# Patient Record
Sex: Male | Born: 1970 | Race: Black or African American | Hispanic: No | Marital: Single | Smoking: Current some day smoker
Health system: Southern US, Community
[De-identification: ages and names within clinical notes are randomized; demographics above are authoritative.]

## PROBLEM LIST (undated history)

## (undated) DIAGNOSIS — I2510 Atherosclerotic heart disease of native coronary artery without angina pectoris: Secondary | ICD-10-CM

## (undated) DIAGNOSIS — I509 Heart failure, unspecified: Secondary | ICD-10-CM

## (undated) HISTORY — PX: HERNIA REPAIR: SHX51

## (undated) HISTORY — DX: Heart failure, unspecified: I50.9

## (undated) HISTORY — DX: Atherosclerotic heart disease of native coronary artery without angina pectoris: I25.10

---

## 1998-07-24 ENCOUNTER — Emergency Department (HOSPITAL_COMMUNITY): Admission: EM | Admit: 1998-07-24 | Discharge: 1998-07-24 | Payer: Self-pay

## 1998-10-15 ENCOUNTER — Encounter: Payer: Self-pay | Admitting: Emergency Medicine

## 1998-10-15 ENCOUNTER — Emergency Department (HOSPITAL_COMMUNITY): Admission: EM | Admit: 1998-10-15 | Discharge: 1998-10-15 | Payer: Self-pay | Admitting: Emergency Medicine

## 2001-02-22 ENCOUNTER — Emergency Department (HOSPITAL_COMMUNITY): Admission: EM | Admit: 2001-02-22 | Discharge: 2001-02-22 | Payer: Self-pay | Admitting: Emergency Medicine

## 2003-06-12 ENCOUNTER — Emergency Department (HOSPITAL_COMMUNITY): Admission: EM | Admit: 2003-06-12 | Discharge: 2003-06-12 | Payer: Self-pay | Admitting: Emergency Medicine

## 2003-06-15 ENCOUNTER — Emergency Department (HOSPITAL_COMMUNITY): Admission: EM | Admit: 2003-06-15 | Discharge: 2003-06-15 | Payer: Self-pay | Admitting: Emergency Medicine

## 2016-07-27 ENCOUNTER — Encounter (HOSPITAL_COMMUNITY): Payer: Self-pay | Admitting: Emergency Medicine

## 2016-07-27 ENCOUNTER — Emergency Department (HOSPITAL_COMMUNITY)
Admission: EM | Admit: 2016-07-27 | Discharge: 2016-07-27 | Disposition: A | Discharge status: Home / Self Care | Payer: No Typology Code available for payment source | Attending: Emergency Medicine | Admitting: Emergency Medicine

## 2016-07-27 ENCOUNTER — Emergency Department (HOSPITAL_COMMUNITY): Payer: No Typology Code available for payment source

## 2016-07-27 DIAGNOSIS — M542 Cervicalgia: Secondary | ICD-10-CM

## 2016-07-27 DIAGNOSIS — S0990XA Unspecified injury of head, initial encounter: Secondary | ICD-10-CM | POA: Diagnosis present

## 2016-07-27 DIAGNOSIS — M5090 Cervical disc disorder, unspecified, unspecified cervical region: Secondary | ICD-10-CM | POA: Diagnosis not present

## 2016-07-27 DIAGNOSIS — S060X0A Concussion without loss of consciousness, initial encounter: Secondary | ICD-10-CM | POA: Insufficient documentation

## 2016-07-27 DIAGNOSIS — Y999 Unspecified external cause status: Secondary | ICD-10-CM | POA: Insufficient documentation

## 2016-07-27 DIAGNOSIS — M5030 Other cervical disc degeneration, unspecified cervical region: Secondary | ICD-10-CM

## 2016-07-27 DIAGNOSIS — Y92410 Unspecified street and highway as the place of occurrence of the external cause: Secondary | ICD-10-CM | POA: Insufficient documentation

## 2016-07-27 DIAGNOSIS — Y939 Activity, unspecified: Secondary | ICD-10-CM | POA: Insufficient documentation

## 2016-07-27 DIAGNOSIS — F17200 Nicotine dependence, unspecified, uncomplicated: Secondary | ICD-10-CM | POA: Insufficient documentation

## 2016-07-27 DIAGNOSIS — M62838 Other muscle spasm: Secondary | ICD-10-CM | POA: Diagnosis not present

## 2016-07-27 DIAGNOSIS — G44319 Acute post-traumatic headache, not intractable: Secondary | ICD-10-CM

## 2016-07-27 MED ORDER — IBUPROFEN 400 MG PO TABS
800.0000 mg | ORAL_TABLET | Freq: Once | ORAL | Status: AC
  Administered 2016-07-27: 800 mg via ORAL
  Filled 2016-07-27: qty 2

## 2016-07-27 MED ORDER — CYCLOBENZAPRINE HCL 10 MG PO TABS: 10.0000 mg | ORAL_TABLET | Freq: Three times a day (TID) | ORAL | 0 refills | Status: DC | PRN

## 2016-07-27 MED ORDER — NAPROXEN 500 MG PO TABS: 500.0000 mg | ORAL_TABLET | Freq: Two times a day (BID) | ORAL | 0 refills | Status: DC | PRN

## 2016-07-27 NOTE — Discharge Instructions (Signed)
Take naprosyn as directed for inflammation and pain with tylenol for breakthrough pain and flexeril for muscle relaxation. Do not drive or operate machinery with muscle relaxant use. Get plenty of rest, use ice on your head.  Stay in a quiet, not simulating, dark environment. No TV, computer use, video games, or cell phone use until headache is resolved completely. Ice to areas of soreness for the next 24 hours and then may move to heat, no more than 20 minutes at a time every hour for each. Expect to be sore for the next few days and follow up with [facility] for recheck of ongoing symptoms and establish medical care in the next 1 week. Return to ER for emergent changing or worsening of symptoms.
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
## 2016-09-03 ENCOUNTER — Emergency Department (HOSPITAL_COMMUNITY)
Admission: EM | Admit: 2016-09-03 | Discharge: 2016-09-03 | Disposition: A | Discharge status: Home / Self Care | Payer: No Typology Code available for payment source | Attending: Emergency Medicine | Admitting: Emergency Medicine

## 2016-09-03 ENCOUNTER — Encounter (HOSPITAL_COMMUNITY): Payer: Self-pay | Admitting: *Deleted

## 2016-09-03 DIAGNOSIS — F17200 Nicotine dependence, unspecified, uncomplicated: Secondary | ICD-10-CM | POA: Diagnosis not present

## 2016-09-03 DIAGNOSIS — Z041 Encounter for examination and observation following transport accident: Secondary | ICD-10-CM | POA: Diagnosis not present

## 2016-09-03 DIAGNOSIS — Z09 Encounter for follow-up examination after completed treatment for conditions other than malignant neoplasm: Secondary | ICD-10-CM
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
## 2016-09-03 NOTE — Discharge Instructions (Signed)
Return here as needed.  Follow-up with your primary care doctor °
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
## 2019-09-18 ENCOUNTER — Other Ambulatory Visit: Payer: Self-pay

## 2019-09-18 ENCOUNTER — Encounter: Payer: Self-pay | Admitting: *Deleted

## 2019-09-18 ENCOUNTER — Emergency Department
Admission: EM | Admit: 2019-09-18 | Discharge: 2019-09-18 | Disposition: A | Discharge status: Home / Self Care | Payer: Self-pay | Attending: Emergency Medicine | Admitting: Emergency Medicine

## 2019-09-18 ENCOUNTER — Emergency Department: Payer: Self-pay

## 2019-09-18 DIAGNOSIS — S9001XA Contusion of right ankle, initial encounter: Secondary | ICD-10-CM

## 2019-09-18 DIAGNOSIS — F17200 Nicotine dependence, unspecified, uncomplicated: Secondary | ICD-10-CM | POA: Insufficient documentation

## 2019-09-18 DIAGNOSIS — W228XXD Striking against or struck by other objects, subsequent encounter: Secondary | ICD-10-CM | POA: Insufficient documentation

## 2019-09-18 DIAGNOSIS — Y990 Civilian activity done for income or pay: Secondary | ICD-10-CM | POA: Insufficient documentation

## 2019-09-18 DIAGNOSIS — S9001XD Contusion of right ankle, subsequent encounter: Secondary | ICD-10-CM | POA: Insufficient documentation

## 2019-09-18 DIAGNOSIS — Y9289 Other specified places as the place of occurrence of the external cause: Secondary | ICD-10-CM | POA: Insufficient documentation
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
## 2019-09-18 NOTE — Discharge Instructions (Addendum)
Follow-up with orthopedics.  Please call for an appointment.  Your Worker's [employer] insurance carrier prior to making the appointment so that you did not end up paying for the visit.  Take over-the-counter Aleve.  Elevate and ice.  Use crutches as needed.

## 2019-12-21 ENCOUNTER — Emergency Department: Payer: Self-pay

## 2019-12-21 ENCOUNTER — Emergency Department
Admission: EM | Admit: 2019-12-21 | Discharge: 2019-12-21 | Disposition: A | Discharge status: Home / Self Care | Payer: Self-pay | Attending: Emergency Medicine | Admitting: Emergency Medicine

## 2019-12-21 ENCOUNTER — Other Ambulatory Visit: Payer: Self-pay

## 2019-12-21 DIAGNOSIS — F17210 Nicotine dependence, cigarettes, uncomplicated: Secondary | ICD-10-CM | POA: Insufficient documentation

## 2019-12-21 DIAGNOSIS — N200 Calculus of kidney: Secondary | ICD-10-CM | POA: Insufficient documentation

## 2019-12-21 LAB — COMPREHENSIVE METABOLIC PANEL
ALT: 27 U/L (ref 0–44)
AST: 27 U/L (ref 15–41)
Albumin: 4.7 g/dL (ref 3.5–5.0)
Alkaline Phosphatase: 97 U/L (ref 38–126)
Anion gap: 10 (ref 5–15)
BUN: 11 mg/dL (ref 6–20)
CO2: 28 mmol/L (ref 22–32)
Calcium: 9.6 mg/dL (ref 8.9–10.3)
Chloride: 102 mmol/L (ref 98–111)
Creatinine, Ser: 1.32 mg/dL — ABNORMAL HIGH (ref 0.61–1.24)
GFR calc Af Amer: >60 mL/min (ref >60)
GFR calc non Af Amer: >60 mL/min (ref >60)
Glucose, Bld: 147 mg/dL — ABNORMAL HIGH (ref 70–99)
Potassium: 3.2 mmol/L — ABNORMAL LOW (ref 3.5–5.1)
Sodium: 140 mmol/L (ref 135–145)
Total Bilirubin: 1.3 mg/dL — ABNORMAL HIGH (ref 0.3–1.2)
Total Protein: 7.7 g/dL (ref 6.5–8.1)

## 2019-12-21 LAB — URINALYSIS, COMPLETE (UACMP) WITH MICROSCOPIC
Bilirubin Urine: NEGATIVE
Glucose, UA: 50 mg/dL — AB
Ketones, ur: NEGATIVE mg/dL
Leukocytes,Ua: NEGATIVE
Nitrite: NEGATIVE
Protein, ur: 30 mg/dL — AB
RBC / HPF: >50 RBC/hpf — ABNORMAL HIGH (ref 0–5)
Specific Gravity, Urine: 1.01 (ref 1.005–1.030)
pH: 6 (ref 5.0–8.0)

## 2019-12-21 LAB — CBC
HCT: 47.6 % (ref 39.0–52.0)
Hemoglobin: 16.9 g/dL (ref 13.0–17.0)
MCH: 33.3 pg (ref 26.0–34.0)
MCHC: 35.5 g/dL (ref 30.0–36.0)
MCV: 93.9 fL (ref 80.0–100.0)
Platelets: 252 10*3/uL (ref 150–400)
RBC: 5.07 MIL/uL (ref 4.22–5.81)
RDW: 11.3 % — ABNORMAL LOW (ref 11.5–15.5)
WBC: 18.6 10*3/uL — ABNORMAL HIGH (ref 4.0–10.5)
nRBC: 0 % (ref 0.0–0.2)

## 2019-12-21 LAB — LIPASE, BLOOD: Lipase: 105 U/L — ABNORMAL HIGH (ref 11–51)

## 2019-12-21 MED ORDER — IOHEXOL 300 MG/ML  SOLN
100.0000 mL | Freq: Once | INTRAMUSCULAR | Status: AC | PRN
  Administered 2019-12-21: 100 mL via INTRAVENOUS

## 2019-12-21 MED ORDER — OXYCODONE-ACETAMINOPHEN 5-325 MG PO TABS: 1.0000 | ORAL_TABLET | ORAL | 0 refills | Status: AC | PRN

## 2019-12-21 NOTE — ED Notes (Signed)
MD at bedside. 

## 2019-12-21 NOTE — ED Provider Notes (Signed)
[facility] Emergency Department Provider Note  ____________________________________________   First MD Initiated Contact with Patient 12/21/19 0309     (approximate)  I have reviewed the triage vital signs and the nursing notes.   HISTORY  Chief Complaint Abdominal Pain and Emesis    HPI Willie Campbell is a 49 y.o. male with no chronic medical conditions presents to the emergency department secondary to acute onset of right lower quadrant abdominal pain with associated vomiting that began tonight after eating hot chips".  Patient denies any diarrhea no fever.  Patient states that nausea vomiting has resolved and pain is improved since onset.  Patient states that the pain radiated into his right testicle.  Patient denies any urinary symptoms        History reviewed. No pertinent past medical history.  There are no problems to display for this patient.   Past Surgical History:  Procedure Laterality Date  . HERNIA REPAIR      Prior to Admission medications   Medication Sig Start Date End Date Taking? Authorizing Provider  oxyCODONE-acetaminophen (PERCOCET) 5-325 MG tablet Take 1 tablet by mouth every 4 (four) hours as needed. 12/21/19 12/20/20  Darci Current, MD    Allergies Patient has no known allergies.  No family history on file.  Social History Social History   Tobacco Use  . Smoking status: Current Some Day Smoker  . Smokeless tobacco: Never Used  Substance Use Topics  . Alcohol use: Not Currently    Comment: sometimes  . Drug use: No    Review of Systems Constitutional: No fever/chills Eyes: No visual changes. ENT: No sore throat. Cardiovascular: Denies chest pain. Respiratory: Denies shortness of breath. Gastrointestinal: Positive for abdominal pain nausea and vomiting no diarrhea.  No constipation. Genitourinary: Negative for dysuria. Musculoskeletal: Negative for neck pain.  Negative for back pain. Integumentary: Negative  for rash. Neurological: Negative for headaches, focal weakness or numbness.   ____________________________________________   PHYSICAL EXAM:  VITAL SIGNS: ED Triage Vitals  Enc Vitals Group     BP 12/21/19 0110 (!) 148/92     Pulse Rate 12/21/19 0110 (!) 59     Resp 12/21/19 0110 18     Temp 12/21/19 0110 97.9 F (36.6 C)     Temp Source 12/21/19 0110 Oral     SpO2 12/21/19 0110 92 %     Weight 12/21/19 0111 107.5 kg (237 lb)     Height 12/21/19 0111 1.803 m (5\' 11" )     Head Circumference --      Peak Flow --      Pain Score 12/21/19 0111 7     Pain Loc --      Pain Edu? --      Excl. in GC? --     Constitutional: Alert and oriented.  Eyes: Conjunctivae are normal.  Mouth/Throat: Patient is wearing a mask. Neck: No stridor.  No meningeal signs.   Cardiovascular: Normal rate, regular rhythm. Good peripheral circulation. Grossly normal heart sounds. Respiratory: Normal respiratory effort.  No retractions. Gastrointestinal: Right lower quadrant tenderness. No distention.  Musculoskeletal: No lower extremity tenderness nor edema. No gross deformities of extremities. Neurologic:  Normal speech and language. No gross focal neurologic deficits are appreciated.  Skin:  Skin is warm, dry and intact. Psychiatric: Mood and affect are normal. Speech and behavior are normal.  ____________________________________________   LABS (all labs ordered are listed, but only abnormal results are displayed)  Labs Reviewed  LIPASE, BLOOD -  Abnormal; Notable for the following components:      Result Value   Lipase 105 (*)    All other components within normal limits  COMPREHENSIVE METABOLIC PANEL - Abnormal; Notable for the following components:   Potassium 3.2 (*)    Glucose, Bld 147 (*)    Creatinine, Ser 1.32 (*)    Total Bilirubin 1.3 (*)    All other components within normal limits  CBC - Abnormal; Notable for the following components:   WBC 18.6 (*)    RDW 11.3 (*)    All  other components within normal limits  URINALYSIS, COMPLETE (UACMP) WITH MICROSCOPIC - Abnormal; Notable for the following components:   Color, Urine YELLOW (*)    APPearance CLEAR (*)    Glucose, UA 50 (*)    Hgb urine dipstick LARGE (*)    Protein, ur 30 (*)    RBC / HPF >50 (*)    Bacteria, UA RARE (*)    All other components within normal limits      RADIOLOGY I, [place] Gaylene Moylan, personally viewed and evaluated these images (plain radiographs) as part of my medical decision making, as well as reviewing the written report by the radiologist.  ED MD interpretation: 5 x 3.5 distal right urethral calculus with mild right-sided hydroureteronephrosis noted on CT abdomen pelvis per radiologist.  Official radiology report(s): CT ABDOMEN PELVIS W CONTRAST  Result Date: 12/21/2019 CLINICAL DATA:  Right lower quadrant abdominal pain with nausea and vomiting. EXAM: CT ABDOMEN AND PELVIS WITH CONTRAST TECHNIQUE: Multidetector CT imaging of the abdomen and pelvis was performed using the standard protocol following bolus administration of intravenous contrast. CONTRAST:  OMNIPAQUE IOHEXOL 300 MG/ML  SOLN COMPARISON:  None. FINDINGS: Lower chest: Insert lung bases Hepatobiliary: No focal hepatic lesions or intrahepatic biliary dilatation. The gallbladder is normal. No common bile duct dilatation. Pancreas: No mass, inflammation or ductal dilatation. Spleen: Normal size. No focal lesions. Adrenals/Urinary Tract: The adrenal glands are unremarkable. There is mild right-sided hydroureteronephrosis down to an obstructing 5.0 x 3.5 mm calculus in the distal right ureter just proximal to the UVJ. No renal calculi or left ureteral calculi. No worrisome renal lesions and no bladder lesions. Stomach/Bowel: The stomach, duodenum, small bowel and colon are grossly normal without oral contrast. No inflammatory changes, mass lesions or obstructive findings. The appendix is normal. Vascular/Lymphatic: The aorta is  normal in caliber. No dissection. Age advanced atherosclerotic calcifications involving the aorta and iliac arteries. The branch vessels are patent. The major venous structures are patent. No mesenteric or retroperitoneal mass or adenopathy. Small scattered lymph nodes are noted. Reproductive: The prostate gland and seminal vesicles are unremarkable. Other: No pelvic mass or adenopathy. No free pelvic fluid collections. No inguinal mass or adenopathy. No abdominal wall hernia or subcutaneous lesions. Musculoskeletal: No significant bony findings. Advanced degenerative disc disease noted at L5-S1. IMPRESSION: 1. 5.0 x 3.5 mm distal right ureteral calculus causing mild right-sided hydroureteronephrosis. 2. No renal calculi or worrisome renal lesions. 3. No other acute abdominal/pelvic findings, mass lesions or adenopathy. 4. Age advanced atherosclerotic calcifications involving the aorta and iliac arteries. Electronically Signed   By: Rudie Meyer M.D.   On: 12/21/2019 05:04     Procedures   ____________________________________________   INITIAL IMPRESSION / MDM / ASSESSMENT AND PLAN / ED COURSE  As part of my medical decision making, I reviewed the following data within the electronic MEDICAL RECORD NUMBER   49 year old male presented with above-stated history and physical  exam with differential diagnosis including but not limited to gastroenteritis appendicitis ureterolithiasis.  As such CT scan revealed right-sided ureterolithiasis as stated above.  Patient advised of all clinical findings and necessity of following up with urology.  Patient has no pain at present.  Patient will be prescribed Percocet as needed for home.  Patient advised not to drive while taking Percocet ____________________________________________  FINAL CLINICAL IMPRESSION(S) / ED DIAGNOSES  Final diagnoses:  Right kidney stone     MEDICATIONS GIVEN DURING THIS VISIT:  Medications  iohexol (OMNIPAQUE) 300 MG/ML solution  100 mL (100 mLs Intravenous Contrast Given 12/21/19 0357)     ED Discharge Orders         Ordered    oxyCODONE-acetaminophen (PERCOCET) 5-325 MG tablet  Every 4 hours PRN     12/21/19 0537          *Please note:  UZIEL COVAULT was evaluated in Emergency Department on 12/21/2019 for the symptoms described in the history of present illness. He was evaluated in the context of the global COVID-19 pandemic, which necessitated consideration that the patient might be at risk for infection with the SARS-CoV-2 virus that causes COVID-19. Institutional protocols and algorithms that pertain to the evaluation of patients at risk for COVID-19 are in a state of rapid change based on information released by regulatory bodies including the CDC and federal and state organizations. These policies and algorithms were followed during the patient's care in the ED.  Some ED evaluations and interventions may be delayed as a result of limited staffing during the pandemic.*  Note:  This document was prepared using Dragon voice recognition software and may include unintentional dictation errors.   Gregor Hams, MD 12/21/19 [phone]

## 2019-12-21 NOTE — ED Triage Notes (Signed)
Patient c/o LRQ abdominal pain radiating to testicle and N/V. Patient denies urinary changes, testicular swelling/redness.

## 2020-01-02 ENCOUNTER — Emergency Department (HOSPITAL_COMMUNITY)
Admission: EM | Admit: 2020-01-02 | Discharge: 2020-01-02 | Disposition: A | Discharge status: Home / Self Care | Payer: Self-pay | Attending: Emergency Medicine | Admitting: Emergency Medicine

## 2020-01-02 ENCOUNTER — Other Ambulatory Visit: Payer: Self-pay

## 2020-01-02 DIAGNOSIS — Z720 Tobacco use: Secondary | ICD-10-CM | POA: Insufficient documentation

## 2020-01-02 DIAGNOSIS — M5440 Lumbago with sciatica, unspecified side: Secondary | ICD-10-CM

## 2020-01-02 DIAGNOSIS — M5442 Lumbago with sciatica, left side: Secondary | ICD-10-CM | POA: Insufficient documentation

## 2020-01-02 LAB — CBC WITH DIFFERENTIAL/PLATELET
Abs Immature Granulocytes: 0.03 10*3/uL (ref 0.00–0.07)
Basophils Absolute: 0.1 10*3/uL (ref 0.0–0.1)
Basophils Relative: 0 %
Eosinophils Absolute: 0 10*3/uL (ref 0.0–0.5)
Eosinophils Relative: 0 %
HCT: 48.2 % (ref 39.0–52.0)
Hemoglobin: 16.5 g/dL (ref 13.0–17.0)
Immature Granulocytes: 0 %
Lymphocytes Relative: 12 %
Lymphs Abs: 1.3 10*3/uL (ref 0.7–4.0)
MCH: 33.1 pg (ref 26.0–34.0)
MCHC: 34.2 g/dL (ref 30.0–36.0)
MCV: 96.8 fL (ref 80.0–100.0)
Monocytes Absolute: 0.5 10*3/uL (ref 0.1–1.0)
Monocytes Relative: 4 %
Neutro Abs: 9.7 10*3/uL — ABNORMAL HIGH (ref 1.7–7.7)
Neutrophils Relative %: 84 %
Platelets: 253 10*3/uL (ref 150–400)
RBC: 4.98 MIL/uL (ref 4.22–5.81)
RDW: 11 % — ABNORMAL LOW (ref 11.5–15.5)
WBC: 11.6 10*3/uL — ABNORMAL HIGH (ref 4.0–10.5)
nRBC: 0 % (ref 0.0–0.2)

## 2020-01-02 LAB — URINALYSIS, ROUTINE W REFLEX MICROSCOPIC
Bilirubin Urine: NEGATIVE
Glucose, UA: NEGATIVE mg/dL
Hgb urine dipstick: NEGATIVE
Ketones, ur: 20 mg/dL — AB
Leukocytes,Ua: NEGATIVE
Nitrite: NEGATIVE
Protein, ur: NEGATIVE mg/dL
Specific Gravity, Urine: 1.016 (ref 1.005–1.030)
pH: 6 (ref 5.0–8.0)

## 2020-01-02 LAB — BASIC METABOLIC PANEL
Anion gap: 8 (ref 5–15)
BUN: 14 mg/dL (ref 6–20)
CO2: 26 mmol/L (ref 22–32)
Calcium: 9.4 mg/dL (ref 8.9–10.3)
Chloride: 102 mmol/L (ref 98–111)
Creatinine, Ser: 1.05 mg/dL (ref 0.61–1.24)
GFR calc Af Amer: >60 mL/min (ref >60)
GFR calc non Af Amer: >60 mL/min (ref >60)
Glucose, Bld: 85 mg/dL (ref 70–99)
Potassium: 4 mmol/L (ref 3.5–5.1)
Sodium: 136 mmol/L (ref 135–145)

## 2020-01-02 MED ORDER — PREDNISONE 20 MG PO TABS: 40.0000 mg | ORAL_TABLET | Freq: Every day | ORAL | 0 refills | Status: AC

## 2020-01-02 MED ORDER — METHOCARBAMOL 500 MG PO TABS: 500.0000 mg | ORAL_TABLET | Freq: Two times a day (BID) | ORAL | 0 refills | Status: DC

## 2020-01-02 MED ORDER — KETOROLAC TROMETHAMINE 15 MG/ML IJ SOLN
15.0000 mg | Freq: Once | INTRAMUSCULAR | Status: AC
  Administered 2020-01-02: 15 mg via INTRAVENOUS
  Filled 2020-01-02: qty 1

## 2020-01-02 MED ORDER — PREDNISONE 20 MG PO TABS: 40.0000 mg | ORAL_TABLET | Freq: Every day | ORAL | 0 refills | Status: DC

## 2020-01-02 NOTE — ED Provider Notes (Signed)
[facility] EMERGENCY DEPARTMENT Provider Note   CSN: [id number] Arrival date & time: 01/02/20  1043     History Chief Complaint  Patient presents with  . Nephrolithiasis    Willie Campbell is a 49 y.o. male who presents for evaluation of left-sided back pain that began acutely this morning while driving.  He states that while driving, he started having pain in his left lower back.  He states that the pain radiated into his left lower thigh.  He states that the pain was worse with movement and he did have some associated diaphoresis.  No nausea/vomiting.  He also feels like the pain radiates into his groin.  He states he has had similar pain for about a week and a half now.  On 12/21/2019, he had worsening pain and nausea/vomiting.  He was seen at [facility] and found to have a right-sided kidney stone.  He was discharged home with Percocet.  He states he is continued to have some left-sided back pain.  On ED arrival, he still states pain is in the left lower back.  He states it hurts more when he bends or tries to move.  He notes especially when he changes positions from laying down, he has worsening pain.  He states that sometimes it will radiate down his thigh as a sharp pain.  He does not know of any preceding trauma, injury.  He does report a remote work injury several months ago.  No chest pain, difficulty breathing, dysuria, hematuria, testicular pain or swelling denies fevers, weight loss, numbness/weakness of upper and lower extremities, bowel/bladder incontinence, saddle anesthesia, history of back surgery, history of IVDA.   The history is provided by the patient.       No past medical history on file.  There are no problems to display for this patient.   Past Surgical History:  Procedure Laterality Date  . HERNIA REPAIR         No family history on file.  Social History   Tobacco Use  . Smoking status: Current Some Day Smoker  .  Smokeless tobacco: Never Used  Substance Use Topics  . Alcohol use: Not Currently    Comment: sometimes  . Drug use: No    Home Medications Prior to Admission medications   Medication Sig Start Date End Date Taking? Authorizing Provider  methocarbamol (ROBAXIN) 500 MG tablet Take 1 tablet (500 mg total) by mouth 2 (two) times daily. 01/02/20   Maxwell Caul, PA-C  oxyCODONE-acetaminophen (PERCOCET) 5-325 MG tablet Take 1 tablet by mouth every 4 (four) hours as needed. 12/21/19 12/20/20  Darci Current, MD  predniSONE (DELTASONE) 20 MG tablet Take 2 tablets (40 mg total) by mouth daily for 4 days. 01/02/20 01/06/20  Maxwell Caul, PA-C    Allergies    Patient has no known allergies.  Review of Systems   Review of Systems  Respiratory: Negative for cough and shortness of breath.   Cardiovascular: Negative for chest pain.  Gastrointestinal: Negative for abdominal pain, nausea and vomiting.  Genitourinary: Negative for dysuria and hematuria.  Musculoskeletal: Positive for back pain.  Neurological: Negative for weakness, numbness and headaches.  All other systems reviewed and are negative.   Physical Exam Updated Vital Signs BP (!) 145/92   Pulse 80   Temp 97.7 F (36.5 C) (Oral)   Resp 17   SpO2 99%   Physical Exam Vitals and nursing note reviewed. Exam conducted with a  chaperone present.  Constitutional:      Appearance: Normal appearance. He is well-developed.  HENT:     Head: Normocephalic and atraumatic.  Eyes:     General: Lids are normal.     Conjunctiva/sclera: Conjunctivae normal.     Pupils: Pupils are equal, round, and reactive to light.  Neck:     Comments: Full flexion/extension and lateral movement of neck fully intact. No bony midline tenderness. No deformities or crepitus.  Cardiovascular:     Rate and Rhythm: Normal rate and regular rhythm.     Pulses: Normal pulses.          Radial pulses are 2+ on the right side and 2+ on the left side.        Dorsalis pedis pulses are 2+ on the right side and 2+ on the left side.     Heart sounds: Normal heart sounds. No murmur. No friction rub. No gallop.   Pulmonary:     Effort: Pulmonary effort is normal.     Breath sounds: Normal breath sounds.     Comments: Lungs clear to auscultation bilaterally.  Symmetric chest rise.  No wheezing, rales, rhonchi. Abdominal:     Palpations: Abdomen is soft. Abdomen is not rigid.     Tenderness: There is no abdominal tenderness. There is no guarding.     Hernia: There is no hernia in the left inguinal area or right inguinal area.     Comments: Abdomen soft, nondistended.  No tenderness noted to palpation.  No CVA tenderness noted bilaterally.  No rigidity, guarding  Genitourinary:    Testes:        Right: Mass or tenderness not present.        Left: Mass or tenderness not present.     Comments: The exam was performed with a chaperone present. Normal male genitalia. No evidence of rash, ulcers or lesions.  No testicular tenderness noted bilaterally.  No overlying warmth, erythema.  No hernia noted bilaterally. Musculoskeletal:        General: Normal range of motion.     Cervical back: Full passive range of motion without pain.       Back:     Comments: No midline T or L-spine tenderness.  Point tenderness noted to the paraspinal muscles of the left lower lumbar region that extend to the tip of the gluteal muscle.  Flexion/extension of back intact but does report worsening pain with flexion.  Skin:    General: Skin is warm and dry.     Capillary Refill: Capillary refill takes less than 2 seconds.  Neurological:     Mental Status: He is alert and oriented to person, place, and time.     Comments: Follows commands, Moves all extremities  5/5 strength to BUE and BLE  Sensation intact throughout all major nerve distributions Normal gait Positive SLR on left   Psychiatric:        Speech: Speech normal.     ED Results / Procedures / Treatments    Labs (all labs ordered are listed, but only abnormal results are displayed) Labs Reviewed  CBC WITH DIFFERENTIAL/PLATELET - Abnormal; Notable for the following components:      Result Value   WBC 11.6 (*)    RDW 11.0 (*)    Neutro Abs 9.7 (*)    All other components within normal limits  URINALYSIS, ROUTINE W REFLEX MICROSCOPIC - Abnormal; Notable for the following components:   Ketones, ur 20 (*)  All other components within normal limits  BASIC METABOLIC PANEL    EKG None  Radiology No results found.  Procedures Procedures (including critical care time)  Medications Ordered in ED Medications  ketorolac (TORADOL) 15 MG/ML injection 15 mg (15 mg Intravenous Given 01/02/20 1225)    ED Course  I have reviewed the triage vital signs and the nursing notes.  Pertinent labs & imaging results that were available during my care of the patient were reviewed by me and considered in my medical decision making (see chart for details).    MDM Rules/Calculators/A&P                      49 year old male who presents for evaluation of left lower back pain that began this morning.  He states he has had this pain over the last several weeks and was seen on 12/21/2019 where he was diagnosed with a right-sided kidney stone.  States he has continued to have pain but felt like it acutely worsened today.  Worse with movement.  No preceding trauma, injury, fall.  No neuro deficits noted.  No red flags.  No abdominal pain, nausea/vomiting.  On initially arrival, he is afebrile, nontoxic-appearing.  Consider kidney stone though I have reviewed his scan he did not see any mention of left renal stone.  Also consider musculoskeletal pain versus sciatica.  History/physical exam not concerning for testicular torsion, intra-abdominal pathology.  History/physical exam concerning for dissection, cardiac etiology.  Additionally, history/physical exam not concerning for cauda equina, spinal abscess.  Plan to  check labs, urine.   I reviewed his visit from 12/21/2019.  On his CT scan, he had a 5 x 3 mm calculus noted in the distal right ureter just proximal to the UVJ.  No renal calculi noted on the left.  Additionally, he had advanced degenerative disc disease noted at L5-S1.  UA negative for any hemoglobin, infectious etiology.  BMP with normal BUN and creatinine.  CBC shows slight leukocytosis at 11.6.  Reevaluation.  Patient is resting comfortably.  He states if he lays down still, his pain improves when he gets up and moves around, it makes it worse.  I suspect this is likely musculoskeletal with sciatica involved.  At this time, his exam is not consistent with a kidney stone as his pain is more lower towards the lower aspect of his lumbar region and into the gluteal area.  Do not feel that he needs to have repeat CT scan given negative urine and reassuring exam here.  We will plan to treat as sciatica. Encouraged follow-up with Urology. At this time, patient exhibits no emergent life-threatening condition that require further evaluation in ED or admission. Patient had ample opportunity for questions and discussion. All patient's questions were answered with full understanding. Strict return precautions discussed. Patient expresses understanding and agreement to plan.   Portions of this note were generated with Scientist, clinical (histocompatibility and immunogenetics). Dictation errors may occur despite best attempts at proofreading.  Final Clinical Impression(s) / ED Diagnoses Final diagnoses:  Acute left-sided low back pain with sciatica, sciatica laterality unspecified    Rx / DC Orders ED Discharge Orders         Ordered    predniSONE (DELTASONE) 20 MG tablet  Daily,   Status:  Discontinued     01/02/20 1338    methocarbamol (ROBAXIN) 500 MG tablet  2 times daily,   Status:  Discontinued     01/02/20 1338    methocarbamol (ROBAXIN)  500 MG tablet  2 times daily     01/02/20 1339    predniSONE (DELTASONE) 20 MG tablet  Daily      01/02/20 1339           Rosana Hoes 01/02/20 1554    Milagros Loll, MD 01/03/20 [id number]

## 2020-01-02 NOTE — Discharge Instructions (Signed)
You can take Tylenol or Ibuprofen as directed for pain. You can alternate Tylenol and Ibuprofen every 4 hours. If you take Tylenol at 1pm, then you can take Ibuprofen at 5pm. Then you can take Tylenol again at 9pm.   Take Robaxin as prescribed. This medication will make you drowsy so do not drive or drink alcohol when taking it.  Take prednisone as directed.   Return to the Emergency Department immediately for any worsening back pain, neck pain, difficulty walking, numbness/weaknss of your arms or legs, urinary or bowel accidents, fever or any other worsening or concerning symptoms.   Return to the Emergency Department immediately if you experience any worsening abdominal pain, fever, persistent nausea and vomiting, inability keep any food down, pain with urination, blood in your urine or any other worsening or concerning symptoms.

## 2020-01-02 NOTE — ED Triage Notes (Signed)
Pt here with L flank pain radiating to groin. Dx with kidney stone on 2/5 and told to follow up with urology, but patient has not followed up.

## 2022-02-26 ENCOUNTER — Inpatient Hospital Stay (HOSPITAL_COMMUNITY)
Admission: EM | Admit: 2022-02-26 | Discharge: 2022-03-01 | DRG: 246 | Disposition: A | Discharge status: Home / Self Care | Payer: Self-pay | Attending: Critical Care Medicine | Admitting: Critical Care Medicine

## 2022-02-26 ENCOUNTER — Emergency Department (HOSPITAL_COMMUNITY): Payer: Self-pay

## 2022-02-26 ENCOUNTER — Inpatient Hospital Stay (HOSPITAL_COMMUNITY)
Admission: EM | Disposition: A | Discharge status: Home / Self Care | Payer: Self-pay | Source: Home / Self Care | Attending: Critical Care Medicine

## 2022-02-26 ENCOUNTER — Encounter (HOSPITAL_COMMUNITY): Payer: Self-pay | Admitting: Cardiology

## 2022-02-26 ENCOUNTER — Inpatient Hospital Stay (HOSPITAL_COMMUNITY): Payer: Self-pay

## 2022-02-26 DIAGNOSIS — R739 Hyperglycemia, unspecified: Secondary | ICD-10-CM | POA: Diagnosis present

## 2022-02-26 DIAGNOSIS — N170 Acute kidney failure with tubular necrosis: Secondary | ICD-10-CM | POA: Diagnosis present

## 2022-02-26 DIAGNOSIS — E8721 Acute metabolic acidosis: Secondary | ICD-10-CM | POA: Diagnosis present

## 2022-02-26 DIAGNOSIS — Z9911 Dependence on respirator [ventilator] status: Secondary | ICD-10-CM

## 2022-02-26 DIAGNOSIS — F17200 Nicotine dependence, unspecified, uncomplicated: Secondary | ICD-10-CM | POA: Diagnosis present

## 2022-02-26 DIAGNOSIS — I2102 ST elevation (STEMI) myocardial infarction involving left anterior descending coronary artery: Secondary | ICD-10-CM

## 2022-02-26 DIAGNOSIS — K219 Gastro-esophageal reflux disease without esophagitis: Secondary | ICD-10-CM | POA: Diagnosis present

## 2022-02-26 DIAGNOSIS — I462 Cardiac arrest due to underlying cardiac condition: Secondary | ICD-10-CM | POA: Diagnosis present

## 2022-02-26 DIAGNOSIS — I469 Cardiac arrest, cause unspecified: Secondary | ICD-10-CM | POA: Diagnosis present

## 2022-02-26 DIAGNOSIS — G9340 Encephalopathy, unspecified: Secondary | ICD-10-CM | POA: Diagnosis present

## 2022-02-26 DIAGNOSIS — J9601 Acute respiratory failure with hypoxia: Secondary | ICD-10-CM | POA: Diagnosis present

## 2022-02-26 DIAGNOSIS — E876 Hypokalemia: Secondary | ICD-10-CM | POA: Diagnosis present

## 2022-02-26 DIAGNOSIS — I5021 Acute systolic (congestive) heart failure: Secondary | ICD-10-CM

## 2022-02-26 DIAGNOSIS — I4901 Ventricular fibrillation: Secondary | ICD-10-CM | POA: Diagnosis present

## 2022-02-26 DIAGNOSIS — Z8616 Personal history of COVID-19: Secondary | ICD-10-CM

## 2022-02-26 DIAGNOSIS — K7200 Acute and subacute hepatic failure without coma: Secondary | ICD-10-CM | POA: Diagnosis present

## 2022-02-26 DIAGNOSIS — J9602 Acute respiratory failure with hypercapnia: Secondary | ICD-10-CM | POA: Diagnosis present

## 2022-02-26 DIAGNOSIS — I213 ST elevation (STEMI) myocardial infarction of unspecified site: Secondary | ICD-10-CM | POA: Diagnosis present

## 2022-02-26 DIAGNOSIS — Z955 Presence of coronary angioplasty implant and graft: Secondary | ICD-10-CM

## 2022-02-26 DIAGNOSIS — Z720 Tobacco use: Secondary | ICD-10-CM

## 2022-02-26 DIAGNOSIS — I272 Pulmonary hypertension, unspecified: Secondary | ICD-10-CM | POA: Diagnosis present

## 2022-02-26 DIAGNOSIS — N179 Acute kidney failure, unspecified: Secondary | ICD-10-CM

## 2022-02-26 DIAGNOSIS — I2109 ST elevation (STEMI) myocardial infarction involving other coronary artery of anterior wall: Principal | ICD-10-CM | POA: Diagnosis present

## 2022-02-26 DIAGNOSIS — R570 Cardiogenic shock: Secondary | ICD-10-CM

## 2022-02-26 DIAGNOSIS — I472 Ventricular tachycardia, unspecified: Secondary | ICD-10-CM | POA: Diagnosis present

## 2022-02-26 DIAGNOSIS — I2510 Atherosclerotic heart disease of native coronary artery without angina pectoris: Secondary | ICD-10-CM | POA: Diagnosis present

## 2022-02-26 HISTORY — DX: Cardiac arrest, cause unspecified: I46.9

## 2022-02-26 HISTORY — PX: CORONARY STENT INTERVENTION: CATH118234

## 2022-02-26 HISTORY — PX: RIGHT/LEFT HEART CATH AND CORONARY ANGIOGRAPHY: CATH118266

## 2022-02-26 HISTORY — DX: ST elevation (STEMI) myocardial infarction of unspecified site: I21.3

## 2022-02-26 LAB — COMPREHENSIVE METABOLIC PANEL
ALT: 133 U/L — ABNORMAL HIGH (ref 0–44)
AST: 138 U/L — ABNORMAL HIGH (ref 15–41)
Albumin: 2.2 g/dL — ABNORMAL LOW (ref 3.5–5.0)
Alkaline Phosphatase: 61 U/L (ref 38–126)
Anion gap: 14 (ref 5–15)
BUN: 9 mg/dL (ref 6–20)
CO2: 15 mmol/L — ABNORMAL LOW (ref 22–32)
Calcium: 7.7 mg/dL — ABNORMAL LOW (ref 8.9–10.3)
Chloride: 107 mmol/L (ref 98–111)
Creatinine, Ser: 1.61 mg/dL — ABNORMAL HIGH (ref 0.61–1.24)
GFR, Estimated: 52 mL/min — ABNORMAL LOW (ref >60)
Glucose, Bld: 359 mg/dL — ABNORMAL HIGH (ref 70–99)
Potassium: 3.8 mmol/L (ref 3.5–5.1)
Sodium: 136 mmol/L (ref 135–145)
Total Bilirubin: 0.8 mg/dL (ref 0.3–1.2)
Total Protein: 3.8 g/dL — ABNORMAL LOW (ref 6.5–8.1)

## 2022-02-26 LAB — BASIC METABOLIC PANEL
Anion gap: 19 — ABNORMAL HIGH (ref 5–15)
BUN: 9 mg/dL (ref 6–20)
CO2: 15 mmol/L — ABNORMAL LOW (ref 22–32)
Calcium: 9 mg/dL (ref 8.9–10.3)
Chloride: 111 mmol/L (ref 98–111)
Creatinine, Ser: 1.45 mg/dL — ABNORMAL HIGH (ref 0.61–1.24)
GFR, Estimated: 59 mL/min — ABNORMAL LOW (ref >60)
Glucose, Bld: 225 mg/dL — ABNORMAL HIGH (ref 70–99)
Potassium: 3.3 mmol/L — ABNORMAL LOW (ref 3.5–5.1)
Sodium: 145 mmol/L (ref 135–145)

## 2022-02-26 LAB — POCT I-STAT EG7
Acid-base deficit: 12 mmol/L — ABNORMAL HIGH (ref 0.0–2.0)
Acid-base deficit: 5 mmol/L — ABNORMAL HIGH (ref 0.0–2.0)
Acid-base deficit: 12 mmol/L — ABNORMAL HIGH (ref 0.0–2.0)
Bicarbonate: 18.6 mmol/L — ABNORMAL LOW (ref 20.0–28.0)
Bicarbonate: 22.3 mmol/L (ref 20.0–28.0)
Bicarbonate: 18.5 mmol/L — ABNORMAL LOW (ref 20.0–28.0)
Calcium, Ion: 1.12 mmol/L — ABNORMAL LOW (ref 1.15–1.40)
Calcium, Ion: 1.22 mmol/L (ref 1.15–1.40)
Calcium, Ion: 1.23 mmol/L (ref 1.15–1.40)
HCT: 36 % — ABNORMAL LOW (ref 39.0–52.0)
HCT: 36 % — ABNORMAL LOW (ref 39.0–52.0)
HCT: 37 % — ABNORMAL LOW (ref 39.0–52.0)
Hemoglobin: 12.2 g/dL — ABNORMAL LOW (ref 13.0–17.0)
Hemoglobin: 12.2 g/dL — ABNORMAL LOW (ref 13.0–17.0)
Hemoglobin: 12.6 g/dL — ABNORMAL LOW (ref 13.0–17.0)
O2 Saturation: 73 %
O2 Saturation: 99 %
O2 Saturation: 72 %
Potassium: 4.1 mmol/L (ref 3.5–5.1)
Potassium: 4.4 mmol/L (ref 3.5–5.1)
Potassium: 4.1 mmol/L (ref 3.5–5.1)
Sodium: 138 mmol/L (ref 135–145)
Sodium: 139 mmol/L (ref 135–145)
Sodium: 138 mmol/L (ref 135–145)
TCO2: 21 mmol/L — ABNORMAL LOW (ref 22–32)
TCO2: 24 mmol/L (ref 22–32)
TCO2: 20 mmol/L — ABNORMAL LOW (ref 22–32)
pCO2, Ven: 50.8 mmHg (ref 44–60)
pCO2, Ven: 64.1 mmHg — ABNORMAL HIGH (ref 44–60)
pCO2, Ven: 63.7 mmHg — ABNORMAL HIGH (ref 44–60)
pH, Ven: 7.071 — CL (ref 7.25–7.43)
pH, Ven: 7.249 — ABNORMAL LOW (ref 7.25–7.43)
pH, Ven: 7.071 — CL (ref 7.25–7.43)
pO2, Ven: 194 mmHg — ABNORMAL HIGH (ref 32–45)
pO2, Ven: 54 mmHg — ABNORMAL HIGH (ref 32–45)
pO2, Ven: 53 mmHg — ABNORMAL HIGH (ref 32–45)

## 2022-02-26 LAB — POCT I-STAT, CHEM 8
BUN: 9 mg/dL (ref 6–20)
Calcium, Ion: 1.18 mmol/L (ref 1.15–1.40)
Chloride: 96 mmol/L — ABNORMAL LOW (ref 98–111)
Creatinine, Ser: 1.2 mg/dL (ref 0.61–1.24)
Glucose, Bld: 317 mg/dL — ABNORMAL HIGH (ref 70–99)
HCT: 35 % — ABNORMAL LOW (ref 39.0–52.0)
Hemoglobin: 11.9 g/dL — ABNORMAL LOW (ref 13.0–17.0)
Potassium: 3.6 mmol/L (ref 3.5–5.1)
Sodium: 131 mmol/L — ABNORMAL LOW (ref 135–145)
TCO2: 19 mmol/L — ABNORMAL LOW (ref 22–32)

## 2022-02-26 LAB — TROPONIN I (HIGH SENSITIVITY)
Troponin I (High Sensitivity): 36 ng/L — ABNORMAL HIGH (ref <18)
Troponin I (High Sensitivity): 266 ng/L (ref <18)
Troponin I (High Sensitivity): 7712 ng/L (ref <18)
Troponin I (High Sensitivity): >24000 ng/L (ref <18)

## 2022-02-26 LAB — POCT I-STAT 7, (LYTES, BLD GAS, ICA,H+H)
Acid-base deficit: 12 mmol/L — ABNORMAL HIGH (ref 0.0–2.0)
Acid-base deficit: 6 mmol/L — ABNORMAL HIGH (ref 0.0–2.0)
Acid-base deficit: 1 mmol/L (ref 0.0–2.0)
Bicarbonate: 17.5 mmol/L — ABNORMAL LOW (ref 20.0–28.0)
Bicarbonate: 19.5 mmol/L — ABNORMAL LOW (ref 20.0–28.0)
Bicarbonate: 24.7 mmol/L (ref 20.0–28.0)
Calcium, Ion: 1.23 mmol/L (ref 1.15–1.40)
Calcium, Ion: 1.18 mmol/L (ref 1.15–1.40)
Calcium, Ion: 1.25 mmol/L (ref 1.15–1.40)
HCT: 36 % — ABNORMAL LOW (ref 39.0–52.0)
HCT: 45 % (ref 39.0–52.0)
HCT: 41 % (ref 39.0–52.0)
Hemoglobin: 12.2 g/dL — ABNORMAL LOW (ref 13.0–17.0)
Hemoglobin: 15.3 g/dL (ref 13.0–17.0)
Hemoglobin: 13.9 g/dL (ref 13.0–17.0)
O2 Saturation: 98 %
O2 Saturation: 95 %
O2 Saturation: 100 %
Patient temperature: 33.6
Patient temperature: 35.4
Potassium: 4.1 mmol/L (ref 3.5–5.1)
Potassium: 4.7 mmol/L (ref 3.5–5.1)
Potassium: 4.1 mmol/L (ref 3.5–5.1)
Sodium: 137 mmol/L (ref 135–145)
Sodium: 138 mmol/L (ref 135–145)
Sodium: 141 mmol/L (ref 135–145)
TCO2: 19 mmol/L — ABNORMAL LOW (ref 22–32)
TCO2: 21 mmol/L — ABNORMAL LOW (ref 22–32)
TCO2: 26 mmol/L (ref 22–32)
pCO2 arterial: 55.8 mmHg — ABNORMAL HIGH (ref 32–48)
pCO2 arterial: 32.6 mmHg (ref 32–48)
pCO2 arterial: 39 mmHg (ref 32–48)
pH, Arterial: 7.103 — CL (ref 7.35–7.45)
pH, Arterial: 7.369 (ref 7.35–7.45)
pH, Arterial: 7.403 (ref 7.35–7.45)
pO2, Arterial: 142 mmHg — ABNORMAL HIGH (ref 83–108)
pO2, Arterial: 67 mmHg — ABNORMAL LOW (ref 83–108)
pO2, Arterial: 188 mmHg — ABNORMAL HIGH (ref 83–108)

## 2022-02-26 LAB — I-STAT CHEM 8, ED
BUN: 10 mg/dL (ref 6–20)
Calcium, Ion: 1.2 mmol/L (ref 1.15–1.40)
Chloride: 106 mmol/L (ref 98–111)
Creatinine, Ser: 1.5 mg/dL — ABNORMAL HIGH (ref 0.61–1.24)
Glucose, Bld: 208 mg/dL — ABNORMAL HIGH (ref 70–99)
HCT: 47 % (ref 39.0–52.0)
Hemoglobin: 16 g/dL (ref 13.0–17.0)
Potassium: 2.7 mmol/L — CL (ref 3.5–5.1)
Sodium: 145 mmol/L (ref 135–145)
TCO2: 25 mmol/L (ref 22–32)

## 2022-02-26 LAB — CBC
HCT: 49.3 % (ref 39.0–52.0)
HCT: 36 % — ABNORMAL LOW (ref 39.0–52.0)
Hemoglobin: 15.6 g/dL (ref 13.0–17.0)
Hemoglobin: 12.4 g/dL — ABNORMAL LOW (ref 13.0–17.0)
MCH: 34.1 pg — ABNORMAL HIGH (ref 26.0–34.0)
MCH: 34 pg (ref 26.0–34.0)
MCHC: 31.6 g/dL (ref 30.0–36.0)
MCHC: 34.4 g/dL (ref 30.0–36.0)
MCV: 107.6 fL — ABNORMAL HIGH (ref 80.0–100.0)
MCV: 98.6 fL (ref 80.0–100.0)
Platelets: 216 10*3/uL (ref 150–400)
Platelets: 128 10*3/uL — ABNORMAL LOW (ref 150–400)
RBC: 4.58 MIL/uL (ref 4.22–5.81)
RBC: 3.65 MIL/uL — ABNORMAL LOW (ref 4.22–5.81)
RDW: 12.4 % (ref 11.5–15.5)
RDW: 12 % (ref 11.5–15.5)
WBC: 15 10*3/uL — ABNORMAL HIGH (ref 4.0–10.5)
WBC: 17.3 10*3/uL — ABNORMAL HIGH (ref 4.0–10.5)
nRBC: 0.6 % — ABNORMAL HIGH (ref 0.0–0.2)
nRBC: 0 % (ref 0.0–0.2)

## 2022-02-26 LAB — PROTIME-INR
INR: 1.2 (ref 0.8–1.2)
INR: 1.7 — ABNORMAL HIGH (ref 0.8–1.2)
INR: 1.3 — ABNORMAL HIGH (ref 0.8–1.2)
Prothrombin Time: 14.7 seconds (ref 11.4–15.2)
Prothrombin Time: 19.9 seconds — ABNORMAL HIGH (ref 11.4–15.2)
Prothrombin Time: 16.3 seconds — ABNORMAL HIGH (ref 11.4–15.2)

## 2022-02-26 LAB — LIPID PANEL
Cholesterol: 121 mg/dL (ref 0–200)
HDL: 21 mg/dL — ABNORMAL LOW (ref >40)
LDL Cholesterol: 78 mg/dL (ref 0–99)
Total CHOL/HDL Ratio: 5.8 RATIO
Triglycerides: 112 mg/dL (ref <150)
VLDL: 22 mg/dL (ref 0–40)

## 2022-02-26 LAB — POCT ACTIVATED CLOTTING TIME
Activated Clotting Time: 215 seconds
Activated Clotting Time: 245 s
Activated Clotting Time: 329 seconds

## 2022-02-26 LAB — HIV ANTIBODY (ROUTINE TESTING W REFLEX): HIV Screen 4th Generation wRfx: NONREACTIVE

## 2022-02-26 LAB — HEMOGLOBIN A1C
Hgb A1c MFr Bld: 4.4 % — ABNORMAL LOW (ref 4.8–5.6)
Hgb A1c MFr Bld: 4.5 % — ABNORMAL LOW (ref 4.8–5.6)
Mean Plasma Glucose: 79.58 mg/dL
Mean Plasma Glucose: 82.45 mg/dL

## 2022-02-26 LAB — APTT
aPTT: >200 seconds (ref 24–36)
aPTT: >200 seconds (ref 24–36)

## 2022-02-26 LAB — MRSA NEXT GEN BY PCR, NASAL: MRSA by PCR Next Gen: NOT DETECTED

## 2022-02-26 LAB — LACTIC ACID, PLASMA
Lactic Acid, Venous: >9 mmol/L (ref 0.5–1.9)
Lactic Acid, Venous: 4.4 mmol/L (ref 0.5–1.9)

## 2022-02-26 LAB — MAGNESIUM: Magnesium: 2.6 mg/dL — ABNORMAL HIGH (ref 1.7–2.4)

## 2022-02-26 LAB — GLUCOSE, CAPILLARY
Glucose-Capillary: 286 mg/dL — ABNORMAL HIGH (ref 70–99)
Glucose-Capillary: 166 mg/dL — ABNORMAL HIGH (ref 70–99)

## 2022-02-26 SURGERY — RIGHT/LEFT HEART CATH AND CORONARY ANGIOGRAPHY
Anesthesia: LOCAL

## 2022-02-26 MED ORDER — IOHEXOL 350 MG/ML SOLN
INTRAVENOUS | Status: DC | PRN
  Administered 2022-02-26: 150 mL

## 2022-02-26 MED ORDER — IPRATROPIUM-ALBUTEROL 0.5-2.5 (3) MG/3ML IN SOLN: 3.0000 mL | Freq: Four times a day (QID) | RESPIRATORY_TRACT | Status: DC | PRN

## 2022-02-26 MED ORDER — TIROFIBAN HCL IN NACL 5-0.9 MG/100ML-% IV SOLN
INTRAVENOUS | Status: AC | PRN
Start: 2022-02-26 — End: 2022-02-26
  Administered 2022-02-26: .075 ug/kg/min via INTRAVENOUS

## 2022-02-26 MED ORDER — MAGNESIUM SULFATE 2 GM/50ML IV SOLN
2.0000 g | Freq: Once | INTRAVENOUS | Status: AC | PRN
  Filled 2022-02-26: qty 50

## 2022-02-26 MED ORDER — FENTANYL CITRATE (PF) 100 MCG/2ML IJ SOLN: 50.0000 ug | Freq: Once | INTRAMUSCULAR | Status: DC

## 2022-02-26 MED ORDER — NOREPINEPHRINE 4 MG/250ML-% IV SOLN
5.0000 ug/min | INTRAVENOUS | Status: DC
  Administered 2022-02-27: 9 ug/min via INTRAVENOUS
  Filled 2022-02-26: qty 250

## 2022-02-26 MED ORDER — POTASSIUM CHLORIDE 10 MEQ/100ML IV SOLN
10.0000 meq | Freq: Once | INTRAVENOUS | Status: AC
  Administered 2022-02-26: 10 meq via INTRAVENOUS
  Filled 2022-02-26: qty 100

## 2022-02-26 MED ORDER — FUROSEMIDE 10 MG/ML IJ SOLN
INTRAMUSCULAR | Status: DC | PRN
  Administered 2022-02-26: 80 mg via INTRAVENOUS

## 2022-02-26 MED ORDER — FUROSEMIDE 10 MG/ML IJ SOLN
INTRAMUSCULAR | Status: AC
Start: 2022-02-26 — End: ?
  Filled 2022-02-26: qty 4

## 2022-02-26 MED ORDER — EPINEPHRINE 1 MG/10ML IJ SOSY
PREFILLED_SYRINGE | INTRAMUSCULAR | Status: DC | PRN
  Administered 2022-02-26: 1 mg via INTRAVENOUS

## 2022-02-26 MED ORDER — AMIODARONE HCL IN DEXTROSE 360-4.14 MG/200ML-% IV SOLN
30.0000 mg/h | INTRAVENOUS | Status: DC
Start: 2022-02-26 — End: 2022-02-26

## 2022-02-26 MED ORDER — AMIODARONE HCL IN DEXTROSE 360-4.14 MG/200ML-% IV SOLN: 60.0000 mg/h | INTRAVENOUS | Status: DC

## 2022-02-26 MED ORDER — LIDOCAINE HCL (PF) 1 % IJ SOLN
INTRAMUSCULAR | Status: AC
  Filled 2022-02-26: qty 30

## 2022-02-26 MED ORDER — ACETAMINOPHEN 650 MG RE SUPP
650.0000 mg | RECTAL | Status: DC | PRN
  Filled 2022-02-26: qty 1

## 2022-02-26 MED ORDER — LIDOCAINE HCL (PF) 1 % IJ SOLN
INTRAMUSCULAR | Status: DC | PRN
  Administered 2022-02-26: 5 mL via SUBCUTANEOUS

## 2022-02-26 MED ORDER — ONDANSETRON HCL 4 MG/2ML IJ SOLN: 4.0000 mg | Freq: Four times a day (QID) | INTRAMUSCULAR | Status: DC | PRN

## 2022-02-26 MED ORDER — HEPARIN SODIUM (PORCINE) 1000 UNIT/ML IJ SOLN
INTRAMUSCULAR | Status: AC
  Filled 2022-02-26: qty 10

## 2022-02-26 MED ORDER — HEPARIN (PORCINE) IN NACL 1000-0.9 UT/500ML-% IV SOLN
INTRAVENOUS | Status: AC
  Filled 2022-02-26: qty 1000

## 2022-02-26 MED ORDER — PROPOFOL 1000 MG/100ML IV EMUL
0.0000 ug/kg/min | INTRAVENOUS | Status: DC
  Filled 2022-02-26: qty 100

## 2022-02-26 MED ORDER — SODIUM CHLORIDE 0.9 % IV SOLN
2.0000 g | INTRAVENOUS | Status: DC
  Administered 2022-02-26 – 2022-02-28 (×3): 2 g via INTRAVENOUS
  Filled 2022-02-26 (×4): qty 20

## 2022-02-26 MED ORDER — HEPARIN SODIUM (PORCINE) 5000 UNIT/ML IJ SOLN
5000.0000 [IU] | Freq: Three times a day (TID) | INTRAMUSCULAR | Status: DC
  Administered 2022-02-27 – 2022-03-01 (×7): 5000 [IU] via SUBCUTANEOUS
  Filled 2022-02-26 (×7): qty 1

## 2022-02-26 MED ORDER — ACETAMINOPHEN 160 MG/5ML PO SOLN
650.0000 mg | ORAL | Status: DC
  Administered 2022-02-26 – 2022-02-27 (×2): 650 mg
  Filled 2022-02-26 (×5): qty 20.3

## 2022-02-26 MED ORDER — HEPARIN (PORCINE) IN NACL 1000-0.9 UT/500ML-% IV SOLN
INTRAVENOUS | Status: DC | PRN
  Administered 2022-02-26 (×2): 500 mL

## 2022-02-26 MED ORDER — ASPIRIN 81 MG PO CHEW: 324.0000 mg | CHEWABLE_TABLET | ORAL | Status: AC

## 2022-02-26 MED ORDER — FUROSEMIDE 10 MG/ML IJ SOLN
INTRAMUSCULAR | Status: AC
  Filled 2022-02-26: qty 4

## 2022-02-26 MED ORDER — TIROFIBAN HCL IN NACL 5-0.9 MG/100ML-% IV SOLN
INTRAVENOUS | Status: AC
Start: 2022-02-26 — End: ?
  Filled 2022-02-26: qty 100

## 2022-02-26 MED ORDER — ROCURONIUM BROMIDE 50 MG/5ML IV SOLN
INTRAVENOUS | Status: AC | PRN
  Administered 2022-02-26: 100 mg via INTRAVENOUS

## 2022-02-26 MED ORDER — HEPARIN (PORCINE) IN NACL 1000-0.9 UT/500ML-% IV SOLN
INTRAVENOUS | Status: AC
  Filled 2022-02-26: qty 500

## 2022-02-26 MED ORDER — DOCUSATE SODIUM 100 MG PO CAPS: 100.0000 mg | ORAL_CAPSULE | Freq: Two times a day (BID) | ORAL | Status: DC | PRN

## 2022-02-26 MED ORDER — TIROFIBAN (AGGRASTAT) BOLUS VIA INFUSION
INTRAVENOUS | Status: DC | PRN
  Administered 2022-02-26: 2275 ug via INTRAVENOUS

## 2022-02-26 MED ORDER — SODIUM CHLORIDE 0.9% FLUSH
10.0000 mL | Freq: Two times a day (BID) | INTRAVENOUS | Status: DC
  Administered 2022-02-27 – 2022-02-28 (×3): 10 mL

## 2022-02-26 MED ORDER — ORAL CARE MOUTH RINSE
15.0000 mL | OROMUCOSAL | Status: DC
  Administered 2022-02-26 – 2022-02-27 (×7): 15 mL via OROMUCOSAL

## 2022-02-26 MED ORDER — HEPARIN SODIUM (PORCINE) 1000 UNIT/ML IJ SOLN
INTRAMUSCULAR | Status: DC | PRN
  Administered 2022-02-26: 5000 [IU] via INTRAVENOUS
  Administered 2022-02-26: 2000 [IU] via INTRAVENOUS
  Administered 2022-02-26: 3000 [IU] via INTRAVENOUS

## 2022-02-26 MED ORDER — ACETAMINOPHEN 325 MG PO TABS
650.0000 mg | ORAL_TABLET | ORAL | Status: DC | PRN
  Filled 2022-02-26: qty 2

## 2022-02-26 MED ORDER — TICAGRELOR 90 MG PO TABS
90.0000 mg | ORAL_TABLET | Freq: Two times a day (BID) | ORAL | Status: DC
  Administered 2022-02-27: 90 mg via ORAL
  Filled 2022-02-26: qty 1

## 2022-02-26 MED ORDER — ACETAMINOPHEN 160 MG/5ML PO SOLN
650.0000 mg | ORAL | Status: DC | PRN
  Filled 2022-02-26: qty 20.3

## 2022-02-26 MED ORDER — ALBUMIN HUMAN 25 % IV SOLN
12.5000 g | Freq: Once | INTRAVENOUS | Status: AC
  Administered 2022-02-26: 12.5 g via INTRAVENOUS
  Filled 2022-02-26: qty 50

## 2022-02-26 MED ORDER — CHLORHEXIDINE GLUCONATE 0.12% ORAL RINSE (MEDLINE KIT)
15.0000 mL | Freq: Two times a day (BID) | OROMUCOSAL | Status: DC
  Administered 2022-02-27: 15 mL via OROMUCOSAL

## 2022-02-26 MED ORDER — FENTANYL BOLUS VIA INFUSION
50.0000 ug | INTRAVENOUS | Status: DC | PRN
  Administered 2022-02-26 (×2): 50 ug via INTRAVENOUS
  Filled 2022-02-26: qty 100

## 2022-02-26 MED ORDER — FENTANYL 2500MCG IN NS 250ML (10MCG/ML) PREMIX INFUSION
50.0000 ug/h | INTRAVENOUS | Status: DC
  Filled 2022-02-26: qty 250

## 2022-02-26 MED ORDER — BUSPIRONE HCL 10 MG PO TABS
30.0000 mg | ORAL_TABLET | Freq: Three times a day (TID) | ORAL | Status: DC | PRN
  Filled 2022-02-26: qty 2

## 2022-02-26 MED ORDER — SODIUM BICARBONATE 8.4 % IV SOLN
INTRAVENOUS | Status: DC | PRN
  Administered 2022-02-26 (×2): 50 meq via INTRAVENOUS

## 2022-02-26 MED ORDER — SODIUM CHLORIDE 0.9% FLUSH: 10.0000 mL | INTRAVENOUS | Status: DC | PRN

## 2022-02-26 MED ORDER — SODIUM BICARBONATE 8.4 % IV SOLN
INTRAVENOUS | Status: AC
  Filled 2022-02-26: qty 100

## 2022-02-26 MED ORDER — PANTOPRAZOLE 2 MG/ML SUSPENSION
40.0000 mg | Freq: Every day | ORAL | Status: DC
  Administered 2022-02-26 – 2022-02-27 (×2): 40 mg
  Filled 2022-02-26 (×3): qty 20

## 2022-02-26 MED ORDER — FENTANYL CITRATE PF 50 MCG/ML IJ SOSY: 50.0000 ug | PREFILLED_SYRINGE | Freq: Once | INTRAMUSCULAR | Status: DC

## 2022-02-26 MED ORDER — ACETAMINOPHEN 650 MG RE SUPP
650.0000 mg | RECTAL | Status: DC
  Filled 2022-02-26: qty 1

## 2022-02-26 MED ORDER — NOREPINEPHRINE 4 MG/250ML-% IV SOLN
INTRAVENOUS | Status: AC
  Administered 2022-02-26: 2 ug/min
  Filled 2022-02-26: qty 250

## 2022-02-26 MED ORDER — SODIUM BICARBONATE 8.4 % IV SOLN
INTRAVENOUS | Status: AC | PRN
  Administered 2022-02-26: 50 meq via INTRAVENOUS

## 2022-02-26 MED ORDER — MILRINONE LACTATE IN DEXTROSE 20-5 MG/100ML-% IV SOLN
0.1250 ug/kg/min | INTRAVENOUS | Status: DC
  Administered 2022-02-26 – 2022-02-27 (×2): 0.125 ug/kg/min via INTRAVENOUS
  Filled 2022-02-26: qty 100

## 2022-02-26 MED ORDER — SODIUM CHLORIDE 0.9 % IV SOLN
INTRAVENOUS | Status: DC | PRN
  Administered 2022-02-26: 10 mL/h via INTRAVENOUS

## 2022-02-26 MED ORDER — DOCUSATE SODIUM 50 MG/5ML PO LIQD
100.0000 mg | Freq: Two times a day (BID) | ORAL | Status: DC
  Filled 2022-02-26: qty 10

## 2022-02-26 MED ORDER — EPINEPHRINE 1 MG/10ML IJ SOSY
PREFILLED_SYRINGE | INTRAMUSCULAR | Status: AC | PRN
  Administered 2022-02-26: 1 mg via INTRAVENOUS

## 2022-02-26 MED ORDER — LACTATED RINGERS IV BOLUS
500.0000 mL | Freq: Once | INTRAVENOUS | Status: AC
  Administered 2022-02-26: 500 mL via INTRAVENOUS

## 2022-02-26 MED ORDER — ACETAMINOPHEN 325 MG PO TABS
650.0000 mg | ORAL_TABLET | ORAL | Status: DC
  Administered 2022-02-27 (×2): 650 mg via ORAL
  Filled 2022-02-26 (×3): qty 2

## 2022-02-26 MED ORDER — MIDAZOLAM-SODIUM CHLORIDE 100-0.9 MG/100ML-% IV SOLN
0.5000 mg/h | INTRAVENOUS | Status: DC
  Administered 2022-02-26: 2 mg/h via INTRAVENOUS
  Filled 2022-02-26: qty 100

## 2022-02-26 MED ORDER — IPRATROPIUM-ALBUTEROL 0.5-2.5 (3) MG/3ML IN SOLN
3.0000 mL | Freq: Four times a day (QID) | RESPIRATORY_TRACT | Status: DC
  Administered 2022-02-26: 3 mL via RESPIRATORY_TRACT
  Filled 2022-02-26: qty 3

## 2022-02-26 MED ORDER — VERAPAMIL HCL 2.5 MG/ML IV SOLN
INTRAVENOUS | Status: AC
  Filled 2022-02-26: qty 2

## 2022-02-26 MED ORDER — TIROFIBAN HCL IN NACL 5-0.9 MG/100ML-% IV SOLN
0.1500 ug/kg/min | INTRAVENOUS | Status: DC
  Administered 2022-02-26: 0.15 ug/kg/min via INTRAVENOUS
  Filled 2022-02-26: qty 100

## 2022-02-26 MED ORDER — FENTANYL 2500MCG IN NS 250ML (10MCG/ML) PREMIX INFUSION
0.0000 ug/h | INTRAVENOUS | Status: DC
  Administered 2022-02-26: 50 ug/h via INTRAVENOUS
  Filled 2022-02-26: qty 250

## 2022-02-26 MED ORDER — INSULIN ASPART 100 UNIT/ML IJ SOLN
2.0000 [IU] | INTRAMUSCULAR | Status: DC
  Administered 2022-02-26 – 2022-02-28 (×2): 4 [IU] via SUBCUTANEOUS

## 2022-02-26 MED ORDER — POLYETHYLENE GLYCOL 3350 17 G PO PACK
17.0000 g | PACK | Freq: Every day | ORAL | Status: DC
  Administered 2022-02-27: 17 g
  Filled 2022-02-26 (×2): qty 1

## 2022-02-26 MED ORDER — ASPIRIN 81 MG PO CHEW
81.0000 mg | CHEWABLE_TABLET | Freq: Every day | ORAL | Status: DC
  Administered 2022-02-27: 81 mg
  Filled 2022-02-26 (×2): qty 1

## 2022-02-26 MED ORDER — TICAGRELOR 90 MG PO TABS
180.0000 mg | ORAL_TABLET | Freq: Once | ORAL | Status: AC
  Administered 2022-02-26: 180 mg
  Filled 2022-02-26: qty 2

## 2022-02-26 MED ORDER — PANTOPRAZOLE SODIUM 40 MG IV SOLR
40.0000 mg | Freq: Every day | INTRAVENOUS | Status: DC
  Administered 2022-02-26: 40 mg via INTRAVENOUS
  Filled 2022-02-26 (×3): qty 10

## 2022-02-26 MED ORDER — POLYETHYLENE GLYCOL 3350 17 G PO PACK: 17.0000 g | PACK | Freq: Every day | ORAL | Status: DC | PRN

## 2022-02-26 MED ORDER — SODIUM CHLORIDE 0.9 % IV SOLN: 250.0000 mL | INTRAVENOUS | Status: DC

## 2022-02-26 MED ORDER — ETOMIDATE 2 MG/ML IV SOLN
INTRAVENOUS | Status: AC | PRN
  Administered 2022-02-26: 20 mg via INTRAVENOUS

## 2022-02-26 MED ORDER — ASPIRIN 300 MG RE SUPP: 300.0000 mg | RECTAL | Status: AC

## 2022-02-26 MED ORDER — CHLORHEXIDINE GLUCONATE CLOTH 2 % EX PADS
6.0000 | MEDICATED_PAD | Freq: Every day | CUTANEOUS | Status: DC
  Administered 2022-02-27 – 2022-03-01 (×3): 6 via TOPICAL

## 2022-02-26 SURGICAL SUPPLY — 27 items
BALL SAPPHIRE NC24 5.0X12 (BALLOONS) ×2
BALLN SAPPHIRE 2.5X15 (BALLOONS) ×2
BALLOON SAPPHIRE 2.5X15 (BALLOONS) IMPLANT
BALLOON SAPPHIRE NC24 5.0X12 (BALLOONS) IMPLANT
CABLE ADAPT PACING TEMP 12FT (ADAPTER) ×1 IMPLANT
CATH 5FR JR4 DIAGNOSTIC (CATHETERS) ×1 IMPLANT
CATH S G BIP PACING (CATHETERS) ×1 IMPLANT
CATH SWAN GANZ VIP 7.5F (CATHETERS) ×1 IMPLANT
CATH VISTA GUIDE 6FR XBLAD3.5 (CATHETERS) ×1 IMPLANT
CATH VISTA GUIDE 6FR XBLAD4 (CATHETERS) ×1 IMPLANT
GLIDESHEATH SLEND A-KIT 6F 22G (SHEATH) IMPLANT
GUIDEWIRE INQWIRE 1.5J.035X260 (WIRE) IMPLANT
INQWIRE 1.5J .035X260CM (WIRE)
KIT ENCORE 26 ADVANTAGE (KITS) ×1 IMPLANT
KIT HEART LEFT (KITS) ×2 IMPLANT
MAT PREVALON FULL STRYKER (MISCELLANEOUS) ×1 IMPLANT
PACK CARDIAC CATHETERIZATION (CUSTOM PROCEDURE TRAY) ×2 IMPLANT
SHEATH PINNACLE 6F 10CM (SHEATH) ×2 IMPLANT
SHEATH PINNACLE 8F 10CM (SHEATH) ×1 IMPLANT
SLEEVE REPOSITIONING LENGTH 30 (MISCELLANEOUS) ×1 IMPLANT
STENT ONYX FRONTIER 4.0X15 (Permanent Stent) ×1 IMPLANT
TRANSDUCER W/STOPCOCK (MISCELLANEOUS) ×3 IMPLANT
TUBING ART PRESS 72  MALE/FEM (TUBING) ×2
TUBING ART PRESS 72 MALE/FEM (TUBING) IMPLANT
TUBING CIL FLEX 10 FLL-RA (TUBING) ×2 IMPLANT
WIRE ASAHI PROWATER 180CM (WIRE) ×1 IMPLANT
WIRE EMERALD 3MM-J .035X150CM (WIRE) ×1 IMPLANT

## 2022-02-26 NOTE — ED Provider Notes (Signed)
?[facility] ?Provider Note ? ? ?CSN: [id number] ?Arrival date & time: 02/26/22  1419 ? ?  ? ?History ? ?No chief complaint on file. ? ? ?Willie Campbell is a 51 y.o. male. ? ?Patient is a 51 year old male presenting in cardiac arrest with CPR in progress.  As per EMS personnel, patient awoke this morning with chest pain attributing his symptoms to anxiety.  EMS arrived and found a STEMI in the anterior lateral leads.  Then had witnessed cardiac arrest.  CPR was started immediately with 5 rounds of epinephrine and rhythm checks demonstrating pulseless V tach.  Shocks administered as per ACLS protocol.  Patient also reported to have torsades.  Magnesium given.  Amiodarone given. Please see their documentation for further details.  ROSC was achieved prior to emergency department arrival.   ? ?The history is provided by the EMS personnel. No language interpreter was used.  ? ?  ? ?Home Medications ?Prior to Admission medications   ?Medication Sig Start Date End Date Taking? Authorizing Provider  ?methocarbamol (ROBAXIN) 500 MG tablet Take 1 tablet (500 mg total) by mouth 2 (two) times daily. 01/02/20   Maxwell CaulLayden, Lindsey A, PA-C  ?   ? ?Allergies    ?Patient has no known allergies.   ? ?Review of Systems   ?Review of Systems  ?Unable to perform ROS: Patient unresponsive  ? ?Physical Exam ?Updated Vital Signs ?BP 100/77   Pulse 85   Resp 18   Ht 5\' 11"  (1.803 m) Comment: Simultaneous filing. User may not have seen previous data.  Wt 104 kg   SpO2 91%   BMI 31.98 kg/m?  ?Physical Exam ?Neurological:  ?   GCS: GCS eye subscore is 1. GCS verbal subscore is 1. GCS motor subscore is 3.  ? ? ?ED Results / Procedures / Treatments   ?Labs ?(all labs ordered are listed, but only abnormal results are displayed) ?Labs Reviewed  ?BASIC METABOLIC PANEL - Abnormal; Notable for the following components:  ?    Result Value  ? Potassium 3.3 (*)   ? CO2 15 (*)   ? Glucose, Bld 225 (*)   ? Creatinine, Ser  1.45 (*)   ? GFR, Estimated 59 (*)   ? Anion gap 19 (*)   ? All other components within normal limits  ?CBC - Abnormal; Notable for the following components:  ? WBC 15.0 (*)   ? MCV 107.6 (*)   ? MCH 34.1 (*)   ? nRBC 0.6 (*)   ? All other components within normal limits  ?HEMOGLOBIN A1C - Abnormal; Notable for the following components:  ? Hgb A1c MFr Bld 4.4 (*)   ? All other components within normal limits  ?I-STAT CHEM 8, ED - Abnormal; Notable for the following components:  ? Potassium 2.7 (*)   ? Creatinine, Ser 1.50 (*)   ? Glucose, Bld 208 (*)   ? All other components within normal limits  ?TROPONIN I (HIGH SENSITIVITY) - Abnormal; Notable for the following components:  ? Troponin I (High Sensitivity) 36 (*)   ? All other components within normal limits  ?PROTIME-INR  ?LACTIC ACID, PLASMA  ?LACTIC ACID, PLASMA  ?MAGNESIUM  ?LIPID PANEL  ?LIPOPROTEIN A (LPA)  ?HEMOGLOBIN A1C  ?COMPREHENSIVE METABOLIC PANEL  ?LIPID PANEL  ?CBC  ?PROTIME-INR  ?APTT  ?COOXEMETRY PANEL  ?POCT ACTIVATED CLOTTING TIME  ?POCT ACTIVATED CLOTTING TIME  ?TROPONIN I (HIGH SENSITIVITY)  ?TROPONIN I (HIGH SENSITIVITY)  ?TROPONIN I (HIGH  SENSITIVITY)  ? ? ?EKG ?None ? ?Radiology ?No results found. ? ?Procedures ?Mar[facility] KitchenCritical Care ?Performed by: Franne Forts, DO ?Authorized by: Franne Forts, DO  ? ?Critical care provider statement:  ?  Critical care time (minutes):  78 ?  Critical care was necessary to treat or prevent imminent or life-threatening deterioration of the following conditions:  Cardiac failure and respiratory failure ?  Critical care was time spent personally by me on the following activities:  Development of treatment plan with patient or surrogate, discussions with consultants, evaluation of patient's response to treatment, examination of patient, ordering and review of laboratory studies, ordering and review of radiographic studies, ordering and performing treatments and interventions, pulse oximetry, re-evaluation of  patient's condition and review of old charts ?  Care discussed with comment:  Cardiology  ? ? ?Medications Ordered in ED ?Medications  ?fentaNYL in NS (85mcg/ml) infusion-PREMIX (50 mcg/hr Intravenous New Bag/Given 02/26/22 1504)  ?midazolam (VERSED) 100 mg/100 mL (1 mg/mL) premix infusion (2 mg/hr Intravenous New Bag/Given 02/26/22 1514)  ?lidocaine (PF) (XYLOCAINE) 1 % injection (5 mLs Subcutaneous Given 02/26/22 1512)  ?heparin sodium (porcine) injection (3,000 Units Intravenous Given 02/26/22 1530)  ?amiodarone (NEXTERONE PREMIX) 360-4.14 MG/200ML-% (1.8 mg/mL) IV infusion (has no administration in time range)  ?amiodarone (NEXTERONE PREMIX) 360-4.14 MG/200ML-% (1.8 mg/mL) IV infusion (has no administration in time range)  ?tirofiban (AGGRASTAT) bolus via infusion (2,275 mcg Intravenous Given 02/26/22 1530)  ?EPINEPHrine (ADRENALIN) 1 MG/10ML injection (1 mg Intravenous Given 02/26/22 1423)  ?tirofiban (AGGRASTAT) infusion 50 mcg/mL 100 mL (0.075 mcg/kg/min ? 104 kg (Order-Specific) Intravenous New Bag/Given 02/26/22 1538)  ?sodium bicarbonate injection (50 mEq Intravenous Given 02/26/22 1601)  ?potassium chloride 10 mEq in 100 mL IVPB (10 mEq Intravenous New Bag/Given 02/26/22 1558)  ?milrinone (PRIMACOR) 20 MG/100 ML (0.2 mg/mL) infusion (has no administration in time range)  ?furosemide (LASIX) injection (80 mg Intravenous Given 02/26/22 1607)  ?Heparin (Porcine) in NaCl 1000-0.9 UT/500ML-% SOLN (500 mLs  Given 02/26/22 1610)  ?iohexol (OMNIPAQUE) 350 MG/ML injection (150 mLs  Given 02/26/22 1610)  ?etomidate (AMIDATE) injection (20 mg Intravenous Given 02/26/22 1422)  ?rocuronium [facility]) injection (100 mg Intravenous Given 02/26/22 1422)  ?EPINEPHrine (ADRENALIN) 1 MG/10ML injection (1 mg Intravenous Given 02/26/22 1434)  ?EPINEPHrine (ADRENALIN) 1 MG/10ML injection (1 mg Intravenous Given 02/26/22 1438)  ?sodium bicarbonate injection (50 mEq Intravenous Given 02/26/22 1442)  ?norepinephrine (LEVOPHED) 4-5  MG/250ML-% infusion SOLN (4 mcg/kg/min  Rate/Dose Change 02/26/22 1544)  ? ? ?ED Course/ Medical Decision Making/ A&P ?  ?                        ?Medical Decision Making ?Amount and/or Complexity of Data Reviewed ?Labs: ordered. ?Radiology: ordered. ? ?Risk ?Prescription drug management. ? ? ?4:16 PM ?Pulses on arrival, patient being paced and bagged. Patient moving upper extremities spontaneously but not awake of following commands, minimally spontaneous breaths. GCS of 5. Pt given roc and etomidate and intubated.  ? ?Cardiology at bedside.  ? ?Lost pulses and CPR re-started. ROSC achieved.  ? ?Istat bmp demonstrates potassium of 2.7. potassium given. Calcium chloride given. Repeat magnesium 2 g given. Amiodarone already given by EMS. BP initially 145/ but decreased to 95/ despite IVF bolus. Levophed started. Heparin requested by cardiology. Bicarb given.  ? ?Patient taken to cath lab for STEMI. ? ? ? ? ? ? ? ?Final Clinical Impression(s) / ED Diagnoses ?Final diagnoses:  ?ST elevation myocardial infarction (STEMI), unspecified artery (  HCC)  ?Cardiac arrest (HCC)  ? ? ?Rx / DC Orders ?ED Discharge Orders   ? ? None  ? ?  ? ? ?  ?Franne Forts, DO ?02/26/22 1622 ? ?

## 2022-02-26 NOTE — Progress Notes (Signed)
eLink Physician-Brief Progress Note ?Patient Name: Willie Campbell ?DOB: 08/27/1971 ?MRN: [id number] ? ? ?Date of Service ? 02/26/2022  ?HPI/Events of Note ? To do list: follow up on CxR. ? ?CxR film reviewed. ?ET tip in between interclavicular heads.  ?No pneumothorax. NG tube going below diaphragm. ?[name] .  ? ?Camera eval done. In synchrony with vent.  ?STEMI, s/p Cardiac arrest-in shock.  ?  ?eICU Interventions ? Continue care.   ? ? ? ?Intervention Category ?Intermediate Interventions: Other: ? ?[name] ?02/26/2022, 6:49 PM ?

## 2022-02-26 NOTE — H&P (Addendum)
? ?NAME:  Willie Campbell, MRN:  [id number], DOB:  [birth year], LOS: 0 ?ADMISSION DATE:  02/26/2022, CONSULTATION DATE:  4/14 ?REFERRING MD:  Dr. Tamala Julian, CHIEF COMPLAINT:  Cardiac Arrest   ? ?History of Present Illness:  ?51 y/o M who presented to [facility] ER via EMS in cardiac arrest.  ? ?The patient called EMS for chest pain & "feeling unwell" for two hours.  On EMS arrival, the patient suffered a cardiac arrest.  His initial rhythm was torsades.  He had approximately 10 minutes of CPR before ROSC.  On arrival to the ER, he was in 2:1 block with HR in the 30's, ST elevation in the anterior leads.  In the ER, he developed VT/VF and was shocked.  He had two rounds of epinephrine, bolus of IV amiodarone then infusion.  He received 5000 units IV heparin, calcium gluconate.  He was intubated.  Patient had approximately 30 minutes total of CPR.  He was taken urgently to the Cath Lab for intervention.  Initial labs notable for sodium 145, K 2.7, chloride 111, CO2 15, glucose 225, BUN 9, creatinine 1.45, troponin 36, WBC 15, hemoglobin 15.6, MCV 107.6 and platelets 216.  Hemoglobin A1c 4.4.  LHC demonstrated occluded LAD s/p stent.  Returned to ICU post procedure on mechanical ventilation, sedation, swan-ganz in place.  ? ?PCCM consulted for hospital admission postcardiac arrest. ? ?Pertinent  Medical History  ?Cardiac Arrest - Torsades, VF/VT ? ?Significant Hospital Events: ?Including procedures, antibiotic start and stop dates in addition to other pertinent events   ?4/14 Admit post arrest, torsades initial rhythm.  Approx 30 min total CPR.  ? ?Interim History / Subjective:  ?As above  ? ?Objective   ?Blood pressure 100/77, pulse 85, resp. rate 18, height 5\' 11"  (1.803 m), SpO2 91 %. ?   ?Vent Mode: PRVC ?FiO2 (%):  [100 %] 100 % ?Set Rate:  [20 bmp-28 bmp] 28 bmp ?Vt Set:  [500 mL-560 mL] 560 mL ?PEEP:  [8 cmH20-12 cmH20] 10 cmH20 ?Plateau Pressure:  [18 cmH20-23 cmH20] 23 cmH20  ?No intake or output data in the 24 hours ending  02/26/22 1602 ?There were no vitals filed for this visit. ? ?Examination: ?General: critically ill appearing, well nourished adult male lying in bed on vent ?HENT: MM pink/moist, ETT, pupils 78mm, anicteric ?Lungs: non-labored at rest, lungs bilaterally clear with good air entry  ?Cardiovascular: s1s2 RRR, ST on monitor, no appreciable m/r/g ?Abdomen: soft/non-tender, bsx4 active  ?Extremities: warm/dry, no edema ?Neuro: sedate on fentanyl/versed  ? ?Resolved Hospital Problem list   ?  ? ?Assessment & Plan:  ? ?Cardiac Arrest ?Initial rhythm torsades, then VF/VT s/p shock, total CPR ~ 30 minutes ?-admit to ICU post [place] ?-Cardiology following, appreciate input  ?-Normothermia protocol  ?-follow troponin ?-ASA, brilinta per Cardiology  ?-assess lipid panel  ?-assess CT head  ?-per cards, keep swan for now. Will hold on central access placement  ? ?Shock Post Arrest  ?-levophed for MAP >65 ?-assess lactic acid, co-ox  ?-place central line for access  ? ?Acute Respiratory Failure secondary to Cardiac Arrest  ?Acute Hypercarbia Post Arrest  ?-PRVC 8cc/kg ?-wean PEEP / FiO2 for sats >90% ?-assess CXR on arrival to ICU  ?-ABG in one hour post arrival and in am  ?-duoneb Q6 & PRN ? ?Hypokalemia  ?-KCL replaced ?-repeat lytes now, replace as indicated  ? ?Hyperglycemia  ?Hgb A1c 4.4 ?-CBG Q4 x 24 hours  ?-suspect largely stress response as initial elevation, add SSI if >180  consistently  ? ?Best Practice (right click and "Reselect all SmartList Selections" daily)  ?Diet/type: NPO ?DVT prophylaxis: prophylactic heparin  ?GI prophylaxis: PPI ?Lines: Central line ?Foley:  Yes, and it is still needed ?Code Status:  full code ?Last date of multidisciplinary goals of care discussion: pending, initial discussion on admission per Dr. Tamala Julian ? ?Labs   ?CBC: ?Recent Labs  ?Lab 02/26/22 ?1431 02/26/22 ?1432  ?WBC 15.0*  --   ?HGB 15.6 16.0  ?HCT 49.3 47.0  ?MCV 107.6*  --   ?PLT 216  --   ? ? ?Basic Metabolic Panel: ?Recent Labs  ?Lab  02/26/22 ?1431 02/26/22 ?1432  ?NA 145 145  ?K 3.3* 2.7*  ?CL 111 106  ?CO2 15*  --   ?GLUCOSE 225* 208*  ?BUN 9 10  ?CREATININE 1.45* 1.50*  ?CALCIUM 9.0  --   ? ?GFR: ?CrCl cannot be calculated (Unknown ideal weight.). ?Recent Labs  ?Lab 02/26/22 ?1431  ?WBC 15.0*  ? ? ?Liver Function Tests: ?No results for input(s): AST, ALT, ALKPHOS, BILITOT, PROT, ALBUMIN in the last 168 hours. ?No results for input(s): LIPASE, AMYLASE in the last 168 hours. ?No results for input(s): AMMONIA in the last 168 hours. ? ?ABG ?   ?Component Value Date/Time  ? TCO2 25 02/26/2022 1432  ?  ? ?Coagulation Profile: ?Recent Labs  ?Lab 02/26/22 ?1431  ?INR 1.2  ? ? ?Cardiac Enzymes: ?No results for input(s): CKTOTAL, CKMB, CKMBINDEX, TROPONINI in the last 168 hours. ? ?HbA1C: ?No results found for: HGBA1C ? ?CBG: ?No results for input(s): GLUCAP in the last 168 hours. ? ?Review of Systems:   ?Unable to complete as patient is altered on mechanical ventilation  ? ?Past Medical History:  ?He,  has a past medical history of Cardiac arrest ([place]) (02/26/2022) and STEMI (ST elevation myocardial infarction) ([place]) (02/26/2022).  ? ?Surgical History:  ? ?Past Surgical History:  ?Procedure Laterality Date  ? HERNIA REPAIR    ?  ? ?Social History:  ? reports that he has been smoking. He has never used smokeless tobacco. He reports that he does not currently use alcohol. He reports that he does not use drugs.  ? ?Family History:  ?His family history is not on file.  ? ?Allergies ?No Known Allergies  ? ?Home Medications  ?Prior to Admission medications   ?Medication Sig Start Date End Date Taking? Authorizing Provider  ?methocarbamol (ROBAXIN) 500 MG tablet Take 1 tablet (500 mg total) by mouth 2 (two) times daily. 01/02/20   Volanda Napoleon, PA-C  ?  ? ?Critical care time: 38 minutes ?  ? ?Noe Gens, MSN, APRN, NP-C, AGACNP-BC ?[facility] ?02/26/2022, 4:04 PM ? ? ?Please see [email or web address] for pager details.  ? ?From 7A-7P if no  response, please call [phone] ?After hours, please call Warren Lacy [phone] ? ? ? ? ?

## 2022-02-26 NOTE — Procedures (Signed)
Patient Name: Willie Campbell  ?MRN: [id number]  ?Epilepsy Attending: Charlsie Quest  ?Referring Physician/Provider: Steffanie Dunn, DO ?Date:02/26/2022 ?Duration: 23.31 mins ? ?Patient history: 51yo m s/p cardiac arrest. EEG to evaluate for seizure ? ?Level of alertness: lethargic  ? ?AEDs during EEG study: versed ? ?Technical aspects: This EEG study was done with scalp electrodes positioned according to the 10-20 International system of electrode placement. Electrical activity was acquired at a sampling rate of 500Hz  and reviewed with a high frequency filter of 70Hz  and a low frequency filter of 1Hz . EEG data were recorded continuously and digitally stored.  ? ?Description: EEG showed continuous generalized 3 to 6 Hz theta-delta slowing with overriding excessive amount of 15 to 18 Hz, beta activity distributed symmetrically and diffusely. Hyperventilation and photic stimulation were not performed.    ? ?ABNORMALITY ?- Continuous slow, generalized ? ?IMPRESSION: ?This study is suggestive of moderate to severe diffuse encephalopathy, nonspecific etiology but likely related to sedation. No seizures or epileptiform discharges were seen throughout the recording. ? ?  ? ?

## 2022-02-26 NOTE — Code Documentation (Signed)
10 MeQ infusion of Potassium given for a K of 2.7. 2 g of Mag administered.  ?

## 2022-02-26 NOTE — Progress Notes (Signed)
STAT EEG complete - results pending. ? ?

## 2022-02-26 NOTE — ED Triage Notes (Signed)
Patient BIB [facility] from home. Pt called EMS with complaints of chest pain ongoing for 2 hours. Pt however brushed it off as a panic attack. When EMS arrived patient shown to have an MI in leads v2-v4. Pt quickly decompensated and was shown to be in torsades, EMS admin 2 g of Mag, and 300 of Amiodarone. Pt lefts pulses and CPR performed for 15 minutes approximately. 1 epi given. Pt regained pulses and was then bradycardic and patient was in a paced rhythm upon arrival. Vitals obtained by EMS  ?BP-110/74 ?HR-40 paced  ?SpO2 unable to obtain  ?

## 2022-02-26 NOTE — Consult Note (Addendum)
? ?The patient has been seen in conjunction with Nada BoozerLaura Ingold, NP. All aspects of care have been considered and discussed. The patient has been personally interviewed, examined, and all clinical data has been reviewed. ? ?STEMI activation in [age] old AAM, sick for at least 2 hours prior to EMS arrival. [facility] had VF cardiac arrest requiring CPR and multiple shocks to get ROSC after 12 minutes.  ?Arrived in ER with external pacing due to underlying 3 degree AVB with HR 30 bpm. He had recurrent VF in ER requiring additional ACLS with with CPR and ROSC after several minutes. In less than 3 minutes, recurrent VF requiring additional CPR. IV heparin was administered. Intubation was performed by Dr. Wallace CullensGray. ROSC again achieved. IV Levophed started, ECG demonstrated persisting anterior STE, and HR 60 bpm. Able to maintain rhythm and shocky SBP  < 100 mmHg x several minutes. ?Decision made to perform acute intervention and patient taken to cardiac cath lab, in critical condition, intubated and unresponsive due to prolonged CPR and sedation. ?Spoke to the patients significant other and explained the critical condition and plans for salvage PCI and possibly hemodynamic support. ? ? ?Critical Care Time: 65 minutes ? ?Cardiology Consultation:  ? ?Patient ID: Willie LutesEric K Campbell ?MRN: [id number]; DOB: June 14, 1971 ? ?Admit date: 02/26/2022 ?Date of Consult: 02/26/2022 ? ?PCP:  Patient, No Pcp Per (Inactive) ?  ?[facility] Providers ?Cardiologist:  None      ? ? ?Patient Profile:  ? ?Willie Lutesric K Elliff is a 51 y.o. male with a hx of tobacco use, nephrolitiasis, hx of COVID in 02/2020 who is being seen 02/26/2022 for the evaluation of cardiac arrest at the request of Dr. In ER ? ?History of Present Illness:  ? ?Mr. [name] with only PMH as above was in usual state of health until today and developed chest pain , after 2 hours he called EMS and en route had torsades.    He had CPR and on arrival to ER at least 2:1 block HR  in 30s with ST elevation in ant lat leads.  He then developed VT and V fib and was shocked.  Had 2 rounds of Epi and given IV bolus of amiodarone and then drip. He did receive 5,000 u of IV heparin.   Additionally calcium gluconate at 1438 today.   Also rec'd IV fentanyl.  Pt intubated.  Dr. Katrinka BlazingSmith evaluated pt and brought emergently to to cath lab.  Dr. Katrinka BlazingSmith discussed the severity of pts condition with family as well.  ? ?K+ 2.7 and IV K+ hanging,  glucose 208 CR 1.50 Hgb 16 WBC 15  ? ?EKG:  The EKG was personally reviewed and demonstrates:  2:1 block with HR in 30s with ST elevation ant lat.  Follow up EKG the same.  ?Telemetry:  Telemetry was personally reviewed and demonstrates:  heart block, Vtach ? ?Past Medical History:  ?Diagnosis Date  ? Cardiac arrest (HCC) 02/26/2022  ? ? ?Past Surgical History:  ?Procedure Laterality Date  ? HERNIA REPAIR    ?  ? ?Home Medications:  ?Prior to Admission medications   ?Medication Sig Start Date End Date Taking? Authorizing Provider  ?methocarbamol (ROBAXIN) 500 MG tablet Take 1 tablet (500 mg total) by mouth 2 (two) times daily. 01/02/20   Maxwell CaulLayden, Lindsey A, PA-C  ? ? ?Inpatient Medications: ?Scheduled Meds: ? ?Continuous Infusions: ? fentaNYL infusion INTRAVENOUS 50 mcg/hr (02/26/22 1504)  ? midazolam 2 mg/hr (02/26/22 1514)  ? ?PRN Meds: ?  heparin sodium (porcine), lidocaine (PF) ? ?Allergies:   No Known Allergies ? ?Social History:   ?Social History  ? ?Socioeconomic History  ? Marital status: Single  ?  Spouse name: Not on file  ? Number of children: Not on file  ? Years of education: Not on file  ? Highest education level: Not on file  ?Occupational History  ? Not on file  ?Tobacco Use  ? Smoking status: Some Days  ? Smokeless tobacco: Never  ?Substance and Sexual Activity  ? Alcohol use: Not Currently  ?  Comment: sometimes  ? Drug use: No  ? Sexual activity: Not on file  ?Other Topics Concern  ? Not on file  ?Social History Narrative  ? Not on file  ? ?Social  Determinants of Health  ? ?Financial Resource Strain: Not on file  ?Food Insecurity: Not on file  ?Transportation Needs: Not on file  ?Physical Activity: Not on file  ?Stress: Not on file  ?Social Connections: Not on file  ?Intimate Partner Violence: Not on file  ?  ?Family History:   ?No family history on file.  Unable to obtain currently pt intubated on vent and sedated.  ? ?ROS:  ?Please see the history of present illness.  ROS negative per family. ?General:no colds or fevers, no weight changes ?Skin:no rashes or ulcers ?HEENT:no blurred vision, no congestion ?CV:see HPI ?PUL:see HPI ?GI:no diarrhea constipation or melena, no indigestion ?GU:no hematuria, no dysuria ?MS:no joint pain, no claudication ?Neuro:no syncope, no lightheadedness ?Endo:no diabetes, no thyroid disease  ?All other ROS reviewed and negative.    ? ?Physical Exam/Data:  ? ?Vitals:  ? 02/26/22 1423 02/26/22 1511  ?SpO2:  91%  ?Height: 5\' 11"  (1.803 m)   ? ?No intake or output data in the 24 hours ending 02/26/22 1520 ? ?  12/21/2019  ?  1:11 AM 09/18/2019  ?  4:08 PM 09/03/2016  ?  1:10 PM  ?Last 3 Weights  ?Weight (lbs) 237 lb 230 lb 225 lb  ?Weight (kg) 107.502 kg 104.327 kg 102.059 kg  ?   ?Body mass index is 33.05 kg/m?.  ?General:  Well nourished, well developed, unresponsive, intubated on vent ?HEENT: normal ?Neck: no JVD ?Vascular: No carotid bruits; Distal pulses 2+ bilaterally ?Cardiac:  normal S1, S2; RRR; no murmur gallup rub or click ?Lungs:  diminished to auscultation bilaterally, no wheezing, rhonchi or rales  ?Abd: soft, nontender, no hepatomegaly  ?Ext: no edema ?Musculoskeletal:  No deformities, BUE and BLE strength normal and equal ?Skin: warm and dry  ?Neuro:  unable to determine, sedated and on vent, ?Psych:  sedated ? ? ?Relevant CV Studies: ?None - cardiac cath now ? ?Laboratory Data: ? ?High Sensitivity Troponin:  No results for input(s): TROPONINIHS in the last 720 hours.   ?Chemistry ?Recent Labs  ?Lab 02/26/22 ?1432  ?NA  145  ?K 2.7*  ?CL 106  ?GLUCOSE 208*  ?BUN 10  ?CREATININE 1.50*  ?  ?No results for input(s): PROT, ALBUMIN, AST, ALT, ALKPHOS, BILITOT in the last 168 hours. ?Lipids No results for input(s): CHOL, TRIG, HDL, LABVLDL, LDLCALC, CHOLHDL in the last 168 hours.  ?Hematology ?Recent Labs  ?Lab 02/26/22 ?1431 02/26/22 ?1432  ?WBC 15.0*  --   ?RBC 4.58  --   ?HGB 15.6 16.0  ?HCT 49.3 47.0  ?MCV 107.6*  --   ?MCH 34.1*  --   ?MCHC 31.6  --   ?RDW 12.4  --   ?PLT 216  --   ? ?  Thyroid No results for input(s): TSH, FREET4 in the last 168 hours.  ?BNPNo results for input(s): BNP, PROBNP in the last 168 hours.  ?DDimer No results for input(s): DDIMER in the last 168 hours. ? ? ?Radiology/Studies:  ?No results found. ? ? ?Assessment and Plan:  ? ?STEMI ant lat wall- with cardiac arrest with Vtach and V fin with resuscitation.  On IV amiodarone. Sedated.  Did receive 5,000 u IV heparin. Now in cardiac cath and may need further devices.  ?Cardiac/Pulmonary arrest intubated on Vent now is SR - per CCM.  ?Hypokalemia receiving IV K+ monitor goal >4.0 and Mg+ >2.0  ?CKD vs AKI --no recent labs (Cr in 2021 was from 1.32 to 1.05) ?Hyperglycemia - check A1c may need sliding scale will leave to primary team. ?Tobacco use, will need to stop.  ? ? ?Risk Assessment/Risk Scores:  ?   ?TIMI Risk Score for ST  Elevation MI:   ?The patient's TIMI risk score is 4, which indicates a 7.3% risk of all cause mortality at 30 days.  ?  ?  ? ? ?For questions or updates, please contact [facility] ?Please consult [email or web address] for contact info under  ? ? ?Signed, ?Nada Boozer, NP  ?02/26/2022 3:20 PM ? ?

## 2022-02-26 NOTE — Progress Notes (Signed)
Responded to page to support pt.'s girlfriend who was alone in waiting area. Pt. Experienced CPR and Is critical . Prayed with friend and provided emotional support. Girlfriend has tried to reach his sisters  several times but got no answer.  Girlfriend is an Glass blower/designer here at [employer].  Will follow as needed. ? ?Willie Campbell, [facility], Pager [phone]   ?

## 2022-02-26 NOTE — Consult Note (Signed)
?  ?Advanced Heart Failure Team Consult Note ? ? ?Primary Physician: Patient, No Pcp Per (Inactive) ?PCP-Cardiologist:  Lesleigh NoeHenry W Smith III, MD ? ?Reason for Consultation: Cardiogenic shock ? ?HPI:   ? ?Willie Campbell is seen today for evaluation of cardiogenic shock at the request of Dr. Katrinka BlazingSmith.  ? ?Willie Campbell is a 51 yo male with GERD and tobacco abuse. Developed CP today. Called EMS after 2 hours of CP. In ambulance developed VT/VF. Multiple shocks in ambulance with CPR. On arrival to ER at least 2:1 block HR in 30s with ST elevation in ant lat leads.  He then developed VT and V fib and was shocked.  Had 2 rounds of Epi and given IV bolus of amiodarone and then drip. Intubated emergently anc brought to cath lab.  ? ?In cath lab had totally occluded LAD with EF 20-25%. LAD opened with PCI/DES. Started on EPI for BP support and shock team called. ? ?RHC cath on NE 2 ? ?Ao 119/87 (104) ?LV 157/37 ?RA 17 ?PA 45/25 (34)  ?PCWP 28 ?Fick 5.61/2.51 ?SVR 1,240 ?CPO 1.2 ?PAPi 1.2 ? ?Initial ABG 7.05/55 ? ?In cath lab I gave several amps bicarb and adjust vent. Then added milrinone. Also gave lasix 80 IV ? ?Review of Systems: Unavailable due to intubation ? ?Home Medications ?Prior to Admission medications   ?Medication Sig Start Date End Date Taking? Authorizing Provider  ?methocarbamol (ROBAXIN) 500 MG tablet Take 1 tablet (500 mg total) by mouth 2 (two) times daily. 01/02/20   Maxwell CaulLayden, Lindsey A, PA-C  ? ? ?Past Medical History: ?Past Medical History:  ?Diagnosis Date  ? Cardiac arrest (HCC) 02/26/2022  ? STEMI (ST elevation myocardial infarction) (HCC) 02/26/2022  ? ? ?Past Surgical History: ?Past Surgical History:  ?Procedure Laterality Date  ? HERNIA REPAIR    ? ? ?Family History: ?History reviewed. No pertinent family history. ? ?Social History: ?Social History  ? ?Socioeconomic History  ? Marital status: Single  ?  Spouse name: Not on file  ? Number of children: Not on file  ? Years of education: Not on file  ? Highest  education level: Not on file  ?Occupational History  ? Not on file  ?Tobacco Use  ? Smoking status: Some Days  ? Smokeless tobacco: Never  ?Substance and Sexual Activity  ? Alcohol use: Not Currently  ?  Comment: sometimes  ? Drug use: No  ? Sexual activity: Not on file  ?Other Topics Concern  ? Not on file  ?Social History Narrative  ? Not on file  ? ?Social Determinants of Health  ? ?Financial Resource Strain: Not on file  ?Food Insecurity: Not on file  ?Transportation Needs: Not on file  ?Physical Activity: Not on file  ?Stress: Not on file  ?Social Connections: Not on file  ? ? ?Allergies:  ?No Known Allergies ? ?Objective:   ? ?Vital Signs:   ?Pulse Rate:  [0-109] 0 (04/14 1633) ?Resp:  [18-49] 18 (04/14 1450) ?BP: (66-158)/(43-108) 86/52 (04/14 1628) ?SpO2:  [52 %-98 %] 97 % (04/14 1628) ?FiO2 (%):  [60 %-100 %] 60 % (04/14 1705) ?Weight:  [104 kg] 104 kg (04/14 1600) ?  ? ?Weight change: ?Filed Weights  ? 02/26/22 1600  ?Weight: 104 kg  ? ? ?Intake/Output:  ?No intake or output data in the 24 hours ending 02/26/22 1717  ? ? ?Physical Exam  ?  ?General:  Intubated on cath table ?HEENT: normal + ETT ?Neck: supple. JVP elevated . Carotids 2+  bilat; no bruits. No lymphadenopathy or thyromegaly appreciated. ?Cor: PMI nondisplaced. Regular rate & rhythm. No rubs, gallops or murmurs. ?Lungs: clear ?Abdomen: soft, nontender, nondistended. No hepatosplenomegaly. No bruits or masses. Good bowel sounds. ?Extremities: no cyanosis, clubbing, rash, edema R groin swan ?Neuro: intubated unresponsive ? ? ?Telemetry  ? ?Sinus 90s Personally reviewed ? ?EKG  ?  ?Sinus brady 33 with wide QRS and anterolateral ST elevation Personally reviewed ? ? ?Labs  ? ?Basic Metabolic Panel: ?Recent Labs  ?Lab 02/26/22 ?1431 02/26/22 ?1432 02/26/22 ?1545 02/26/22 ?1548 02/26/22 ?1556 02/26/22 ?1557 02/26/22 ?1600 02/26/22 ?1616  ?NA 145 145 136 131* 138 138 137 139  ?K 3.3* 2.7* 3.8 3.6 4.1 4.1 4.1 4.4  ?CL 111 106 107 96*  --   --   --   --    ?CO2 15*  --  15*  --   --   --   --   --   ?GLUCOSE 225* 208* 359* 317*  --   --   --   --   ?BUN 9 10 9 9   --   --   --   --   ?CREATININE 1.45* 1.50* 1.61* 1.20  --   --   --   --   ?CALCIUM 9.0  --  7.7*  --   --   --   --   --   ? ? ?Liver Function Tests: ?Recent Labs  ?Lab 02/26/22 ?1545  ?AST 138*  ?ALT 133*  ?ALKPHOS 61  ?BILITOT 0.8  ?PROT 3.8*  ?ALBUMIN 2.2*  ? ?No results for input(s): LIPASE, AMYLASE in the last 168 hours. ?No results for input(s): AMMONIA in the last 168 hours. ? ?CBC: ?Recent Labs  ?Lab 02/26/22 ?1431 02/26/22 ?1432 02/26/22 ?1545 02/26/22 ?1548 02/26/22 ?1556 02/26/22 ?1557 02/26/22 ?1600 02/26/22 ?1616  ?WBC 15.0*  --  17.3*  --   --   --   --   --   ?HGB 15.6   < > 12.4* 11.9* 12.6* 12.2* 12.2* 12.2*  ?HCT 49.3   < > 36.0* 35.0* 37.0* 36.0* 36.0* 36.0*  ?MCV 107.6*  --  98.6  --   --   --   --   --   ?PLT 216  --  128*  --   --   --   --   --   ? < > = values in this interval not displayed.  ? ? ?Cardiac Enzymes: ?No results for input(s): CKTOTAL, CKMB, CKMBINDEX, TROPONINI in the last 168 hours. ? ?BNP: ?BNP (last 3 results) ?No results for input(s): BNP in the last 8760 hours. ? ?ProBNP (last 3 results) ?No results for input(s): PROBNP in the last 8760 hours. ? ? ?CBG: ?No results for input(s): GLUCAP in the last 168 hours. ? ?Coagulation Studies: ?Recent Labs  ?  02/26/22 ?1431 02/26/22 ?1545  ?LABPROT 14.7 19.9*  ?INR 1.2 1.7*  ? ? ? ?Imaging  ? ?No results found. ? ? ?Medications:   ? ? ?Current Medications: ? acetaminophen  650 mg Oral Q4H  ? Or  ? acetaminophen (TYLENOL) oral liquid 160 mg/5 mL  650 mg Per Tube Q4H  ? Or  ? acetaminophen  650 mg Rectal Q4H  ? aspirin  324 mg Oral NOW  ? Or  ? aspirin  300 mg Rectal NOW  ? docusate  100 mg Per Tube BID  ? fentaNYL (SUBLIMAZE) injection  50 mcg Intravenous Once  ? [START ON 02/27/2022] heparin  5,000 Units  Subcutaneous Q8H  ? ipratropium-albuterol  3 mL Nebulization Q6H  ? pantoprazole (PROTONIX) IV  40 mg Intravenous QHS  ?  pantoprazole sodium  40 mg Per Tube Daily  ? polyethylene glycol  17 g Per Tube Daily  ? ? ?Infusions: ? sodium chloride    ? amiodarone    ? amiodarone    ? cefTRIAXone (ROCEPHIN)  IV    ? fentaNYL infusion INTRAVENOUS    ? magnesium sulfate    ? midazolam 2 mg/hr (02/26/22 1514)  ? milrinone 0.125 mcg/kg/min (02/26/22 1615)  ? norepinephrine (LEVOPHED) Adult infusion    ? propofol (DIPRIVAN) infusion    ? tirofiban    ? ? ? ? ? ?Assessment/Plan  ? ?1. Cardiac arrest (VT/VF) in setting of acute anterolateral MI ?- now s/p PCI LAD ?- continue supportive care ?- assess neuro status ? ?2. CAD with acute anterolateral MI ?- LAD 100% occluded on cath -> PCI/DES ?- DAPT/statin ?- Eventual CR and b-blocker  ? ?3. Acute systolic HF - > cardiogenic shock ?- EF 20-25% on cath ?- support with NE/milrinone. Follow swan numbers ?- diurese ?- titrate GDMT  ?- check echo ? ?4. Acute hypoxic respiratory failure ?- in setting of cardiac arrest ?- CCM managing vent  ? ?5. Hypokalemia ?- supp ? ?6. Shock liver  ?- due to arrest ?- support care ? ? ?CRITICAL CARE ?Performed by: Arvilla Meres ? ?Total critical care time: 60 minutes ? ?Critical care time was exclusive of separately billable procedures and treating other patients. ? ?Critical care was necessary to treat or prevent imminent or life-threatening deterioration. ? ?Critical care was time spent personally by me (independent of midlevel providers or residents) on the following activities: development of treatment plan with patient and/or surrogate as well as nursing, discussions with consultants, evaluation of patient's response to treatment, examination of patient, obtaining history from patient or surrogate, ordering and performing treatments and interventions, ordering and review of laboratory studies, ordering and review of radiographic studies, pulse oximetry and re-evaluation of patient's condition. ? ?Arvilla Meres, MD  ?5:34 PM ? ? ?Length of Stay: 0 ? ?Arvilla Meres, MD  ?02/26/2022, 5:17 PM ? ?Advanced Heart Failure Team ?Pager [phone] (M-F; 7a - 5p)  ?Please contact [facility] for night-coverage after hours (4p -7a ) and weekends on [email or web address] ?

## 2022-02-26 NOTE — CV Procedure (Addendum)
Acute occlusion of proximal to mid LAD beyond the first diagonal reduced to 0% stenosis and improving 0 TIMI flow to TIMI-3 using a 4.0 x 15 Onyx postdilated at high pressure to 5 mm in diameter. ?First diagonal 70% stenosis proximal eccentric stenosis ?Left main widely patent ?Circumflex widely patent and dominant ?RCA is nondominant ?LVEF estimated to be 25 to 30%.  LVEDP 38 mmHg.  Acute systolic heart failure secondary to anterior infarction. ?Pulmonary hypertension with mean pressure 34 mmHg.  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  PVR 1.07 Wood units ?Cardiac output 5.61 L/min and cardiac index 2.51 L/min/m?Marland Kitchen  Main PA O2 saturation 73%. ? ?Acute heart failure with cardiogenic shock requiring IV Levophed to support blood pressure.  Milrinone started in Cath Lab.  IV Lasix given.  Still acidotic but improving. ? ?Continue Aggrastat until 2 hours post loading dose of Brillinta. Once orogastric tube in place, will receive 180 mg of Brilinta then 90 mg twice daily.  Aspirin 81 mg daily. ? ?Advanced heart failure service, Dr. Haroldine Laws consulted.  Critical care service consulted. ?

## 2022-02-26 NOTE — Code Documentation (Signed)
Calcium gluc at 1438  ?Lost pulses Vtach and then vfib defib at 1441 at 120 J  ?

## 2022-02-26 NOTE — Code Documentation (Signed)
Rosc again  ?

## 2022-02-26 NOTE — Progress Notes (Signed)
Pt was transported from the cath lab to 2h13 without complications.  ?

## 2022-02-26 NOTE — Code Documentation (Signed)
Heparin bolus 5000 units given  ?

## 2022-02-26 NOTE — ED Triage Notes (Signed)
Rosc at 1439  ?

## 2022-02-26 NOTE — Progress Notes (Signed)
Per MD order, pt's ETT was advanced 2cm. Placing his ETT now 28 at the lip. ?

## 2022-02-27 ENCOUNTER — Inpatient Hospital Stay (HOSPITAL_COMMUNITY): Payer: Self-pay

## 2022-02-27 ENCOUNTER — Other Ambulatory Visit: Payer: Self-pay

## 2022-02-27 DIAGNOSIS — I469 Cardiac arrest, cause unspecified: Secondary | ICD-10-CM

## 2022-02-27 DIAGNOSIS — J810 Acute pulmonary edema: Secondary | ICD-10-CM

## 2022-02-27 LAB — GLUCOSE, CAPILLARY
Glucose-Capillary: 94 mg/dL (ref 70–99)
Glucose-Capillary: 107 mg/dL — ABNORMAL HIGH (ref 70–99)
Glucose-Capillary: 86 mg/dL (ref 70–99)
Glucose-Capillary: 139 mg/dL — ABNORMAL HIGH (ref 70–99)
Glucose-Capillary: 108 mg/dL — ABNORMAL HIGH (ref 70–99)

## 2022-02-27 LAB — HEPATIC FUNCTION PANEL
ALT: 207 U/L — ABNORMAL HIGH (ref 0–44)
AST: 443 U/L — ABNORMAL HIGH (ref 15–41)
Albumin: 3.3 g/dL — ABNORMAL LOW (ref 3.5–5.0)
Alkaline Phosphatase: 78 U/L (ref 38–126)
Bilirubin, Direct: 0.2 mg/dL (ref 0.0–0.2)
Indirect Bilirubin: 0.8 mg/dL (ref 0.3–0.9)
Total Bilirubin: 1 mg/dL (ref 0.3–1.2)
Total Protein: 5.8 g/dL — ABNORMAL LOW (ref 6.5–8.1)

## 2022-02-27 LAB — POCT I-STAT EG7
Acid-Base Excess: 0 mmol/L (ref 0.0–2.0)
Bicarbonate: 24.1 mmol/L (ref 20.0–28.0)
Calcium, Ion: 1.19 mmol/L (ref 1.15–1.40)
HCT: 42 % (ref 39.0–52.0)
Hemoglobin: 14.3 g/dL (ref 13.0–17.0)
O2 Saturation: 79 %
Patient temperature: 36.7
Potassium: 3.9 mmol/L (ref 3.5–5.1)
Sodium: 142 mmol/L (ref 135–145)
TCO2: 25 mmol/L (ref 22–32)
pCO2, Ven: 37.4 mmHg — ABNORMAL LOW (ref 44–60)
pH, Ven: 7.416 (ref 7.25–7.43)
pO2, Ven: 42 mmHg (ref 32–45)

## 2022-02-27 LAB — BASIC METABOLIC PANEL
Anion gap: 7 (ref 5–15)
Anion gap: 12 (ref 5–15)
BUN: 14 mg/dL (ref 6–20)
BUN: 12 mg/dL (ref 6–20)
CO2: 25 mmol/L (ref 22–32)
CO2: 27 mmol/L (ref 22–32)
Calcium: 8.7 mg/dL — ABNORMAL LOW (ref 8.9–10.3)
Calcium: 8.7 mg/dL — ABNORMAL LOW (ref 8.9–10.3)
Chloride: 111 mmol/L (ref 98–111)
Chloride: 100 mmol/L (ref 98–111)
Creatinine, Ser: 1.81 mg/dL — ABNORMAL HIGH (ref 0.61–1.24)
Creatinine, Ser: 1.75 mg/dL — ABNORMAL HIGH (ref 0.61–1.24)
GFR, Estimated: 45 mL/min — ABNORMAL LOW (ref >60)
GFR, Estimated: 47 mL/min — ABNORMAL LOW (ref >60)
Glucose, Bld: 117 mg/dL — ABNORMAL HIGH (ref 70–99)
Glucose, Bld: 123 mg/dL — ABNORMAL HIGH (ref 70–99)
Potassium: 4.1 mmol/L (ref 3.5–5.1)
Potassium: 3.6 mmol/L (ref 3.5–5.1)
Sodium: 143 mmol/L (ref 135–145)
Sodium: 139 mmol/L (ref 135–145)

## 2022-02-27 LAB — PHOSPHORUS: Phosphorus: 2.4 mg/dL — ABNORMAL LOW (ref 2.5–4.6)

## 2022-02-27 LAB — CBC
HCT: 41.1 % (ref 39.0–52.0)
Hemoglobin: 14.4 g/dL (ref 13.0–17.0)
MCH: 33.2 pg (ref 26.0–34.0)
MCHC: 35 g/dL (ref 30.0–36.0)
MCV: 94.7 fL (ref 80.0–100.0)
Platelets: 246 10*3/uL (ref 150–400)
RBC: 4.34 MIL/uL (ref 4.22–5.81)
RDW: 11.8 % (ref 11.5–15.5)
WBC: 18.6 10*3/uL — ABNORMAL HIGH (ref 4.0–10.5)
nRBC: 0 % (ref 0.0–0.2)

## 2022-02-27 LAB — TRIGLYCERIDES: Triglycerides: 105 mg/dL (ref <150)

## 2022-02-27 LAB — COOXEMETRY PANEL
Carboxyhemoglobin: 1.5 % (ref 0.5–1.5)
Methemoglobin: <0.7 % (ref 0.0–1.5)
O2 Saturation: 80.7 %
Total hemoglobin: 14.8 g/dL (ref 12.0–16.0)

## 2022-02-27 LAB — POCT I-STAT 7, (LYTES, BLD GAS, ICA,H+H)
Acid-Base Excess: 0 mmol/L (ref 0.0–2.0)
Bicarbonate: 24.3 mmol/L (ref 20.0–28.0)
Calcium, Ion: 1.21 mmol/L (ref 1.15–1.40)
HCT: 42 % (ref 39.0–52.0)
Hemoglobin: 14.3 g/dL (ref 13.0–17.0)
O2 Saturation: 99 %
Patient temperature: 36.7
Potassium: 4.1 mmol/L (ref 3.5–5.1)
Sodium: 141 mmol/L (ref 135–145)
TCO2: 25 mmol/L (ref 22–32)
pCO2 arterial: 35.1 mmHg (ref 32–48)
pH, Arterial: 7.446 (ref 7.35–7.45)
pO2, Arterial: 148 mmHg — ABNORMAL HIGH (ref 83–108)

## 2022-02-27 LAB — ECHOCARDIOGRAM COMPLETE
AR max vel: 2.38 cm2
AV Area VTI: 2.58 cm2
AV Area mean vel: 2.41 cm2
AV Mean grad: 3 mmHg
AV Peak grad: 5.1 mmHg
Ao pk vel: 1.13 m/s
Area-P 1/2: 3.19 cm2
Calc EF: 39 %
Height: 71 in
S' Lateral: 3.1 cm
Single Plane A2C EF: 40 %
Single Plane A4C EF: 35.4 %
Weight: 3668.45 oz

## 2022-02-27 LAB — MAGNESIUM
Magnesium: 2.2 mg/dL (ref 1.7–2.4)
Magnesium: 2 mg/dL (ref 1.7–2.4)

## 2022-02-27 MED ORDER — ACETAMINOPHEN 325 MG PO TABS
650.0000 mg | ORAL_TABLET | ORAL | Status: AC
  Administered 2022-02-27 – 2022-02-28 (×2): 650 mg via ORAL
  Filled 2022-02-27 (×2): qty 2

## 2022-02-27 MED ORDER — SPIRONOLACTONE 12.5 MG HALF TABLET
12.5000 mg | ORAL_TABLET | Freq: Every day | ORAL | Status: DC
Start: 2022-02-27 — End: 2022-03-01
  Administered 2022-02-27 – 2022-03-01 (×3): 12.5 mg via ORAL
  Filled 2022-02-27 (×3): qty 1

## 2022-02-27 MED ORDER — FUROSEMIDE 10 MG/ML IJ SOLN
40.0000 mg | Freq: Once | INTRAMUSCULAR | Status: AC
  Administered 2022-02-27: 40 mg via INTRAVENOUS
  Filled 2022-02-27: qty 4

## 2022-02-27 MED ORDER — ATORVASTATIN CALCIUM 40 MG PO TABS
40.0000 mg | ORAL_TABLET | Freq: Every day | ORAL | Status: DC
Start: 2022-02-27 — End: 2022-02-28
  Administered 2022-02-27: 40 mg
  Filled 2022-02-27 (×2): qty 1

## 2022-02-27 MED ORDER — DOCUSATE SODIUM 50 MG/5ML PO LIQD: 100.0000 mg | Freq: Two times a day (BID) | ORAL | Status: DC | PRN

## 2022-02-27 MED ORDER — POTASSIUM CHLORIDE 10 MEQ/50ML IV SOLN: 10.0000 meq | INTRAVENOUS | Status: DC

## 2022-02-27 MED ORDER — POTASSIUM & SODIUM PHOSPHATES 280-160-250 MG PO PACK
2.0000 | PACK | Freq: Once | ORAL | Status: AC
  Administered 2022-02-27: 2
  Filled 2022-02-27: qty 2

## 2022-02-27 MED ORDER — POTASSIUM CHLORIDE 10 MEQ/100ML IV SOLN: 10.0000 meq | INTRAVENOUS | Status: DC

## 2022-02-27 MED ORDER — ACETAMINOPHEN 650 MG RE SUPP: 650.0000 mg | RECTAL | Status: AC

## 2022-02-27 MED ORDER — POLYETHYLENE GLYCOL 3350 17 G PO PACK: 17.0000 g | PACK | Freq: Every day | ORAL | Status: DC | PRN

## 2022-02-27 MED ORDER — CARVEDILOL 3.125 MG PO TABS
3.1250 mg | ORAL_TABLET | Freq: Two times a day (BID) | ORAL | Status: DC
  Administered 2022-02-27 – 2022-03-01 (×4): 3.125 mg via ORAL
  Filled 2022-02-27 (×4): qty 1

## 2022-02-27 MED ORDER — POTASSIUM CHLORIDE CRYS ER 20 MEQ PO TBCR
20.0000 meq | EXTENDED_RELEASE_TABLET | Freq: Once | ORAL | Status: AC
  Administered 2022-02-27: 20 meq via ORAL
  Filled 2022-02-27: qty 1

## 2022-02-27 MED ORDER — TICAGRELOR 90 MG PO TABS
90.0000 mg | ORAL_TABLET | Freq: Two times a day (BID) | ORAL | Status: DC
  Administered 2022-02-27: 90 mg
  Filled 2022-02-27 (×2): qty 1

## 2022-02-27 MED ORDER — ACETAMINOPHEN 160 MG/5ML PO SOLN: 650.0000 mg | ORAL | Status: AC

## 2022-02-27 MED ORDER — LOSARTAN POTASSIUM 25 MG PO TABS
25.0000 mg | ORAL_TABLET | Freq: Every day | ORAL | Status: DC
  Administered 2022-02-27 – 2022-03-01 (×3): 25 mg via ORAL
  Filled 2022-02-27 (×3): qty 1

## 2022-02-27 NOTE — Progress Notes (Signed)
Removed Lt. Humerus I.O. without complications. Patient denies any pain and v/s stable. HS Nedra Hai RN ?

## 2022-02-27 NOTE — Progress Notes (Signed)
? ? ?Advanced Heart Failure Rounding Note ? ? ?Subjective:   ? ?Remains on vent. Following commands.  ? ?NE off. On milrinone 0.125 ? ?Echo being done at bedside EF ~40% ? ?Swan ?CVP 12 ?PCWP 10 ?Thermo 7.7/3.5 ?SVR 777 ? ?Objective:   ?Weight Range: ? ?Vital Signs:   ?Temp:  [92.5 ?F (33.6 ?C)-98.2 ?F (36.8 ?C)] 98.1 ?F (36.7 ?C) (04/15 0600) ?Pulse Rate:  [0-109] 84 (04/15 0728) ?Resp:  [12-49] 12 (04/15 0728) ?BP: (66-158)/(43-108) 105/52 (04/15 0728) ?SpO2:  [52 %-100 %] 99 % (04/15 0600) ?Arterial Line BP: (74-145)/(49-92) 105/63 (04/15 0600) ?FiO2 (%):  [40 %-100 %] 40 % (04/15 0729) ?Weight:  [104 kg] 104 kg (04/14 1600) ?Last BM Date :  (PTA) ? ?Weight change: ?Filed Weights  ? 02/26/22 1600  ?Weight: 104 kg  ? ? ?Intake/Output:  ? ?Intake/Output Summary (Last 24 hours) at 02/27/2022 0921 ?Last data filed at 02/27/2022 0600 ?Gross per 24 hour  ?Intake 1247.91 ml  ?Output 2650 ml  ?Net -1402.09 ml  ?  ? ?Physical Exam: ?General:  Awake on vent.  ?HEENT: normal + ETT ?Neck: supple. JVP 12. Carotids 2+ bilat; no bruits. No lymphadenopathy or thryomegaly appreciated. ?Cor: PMI nondisplaced. Regular rate & rhythm. No rubs, gallops or murmurs. ?Lungs: clear ?Abdomen: soft, nontender, nondistended. No hepatosplenomegaly. No bruits or masses. Good bowel sounds. ?Extremities: no cyanosis, clubbing, rash, edema RFV swan ?Neuro: alert & orientedx3, cranial nerves grossly intact. moves all 4 extremities w/o difficulty. Affect pleasant ? ?Telemetry: Sinus 80s Personally reviewed ? ?Labs: ?Basic Metabolic Panel: ?Recent Labs  ?Lab 02/26/22 ?1431 02/26/22 ?1432 02/26/22 ?1545 02/26/22 ?1548 02/26/22 ?1556 02/26/22 ?1749 02/26/22 ?1954 02/26/22 ?2129 02/27/22 ?0448 02/27/22 ?[id number] 02/27/22 ?[id number]  ?NA 145 145 136 131*   < > 138  --  141 141 142 143  ?K 3.3* 2.7* 3.8 3.6   < > 4.7  --  4.1 4.1 3.9 4.1  ?CL 111 106 107 96*  --   --   --   --   --   --  111  ?CO2 15*  --  15*  --   --   --   --   --   --   --  25  ?GLUCOSE 225*  208* 359* 317*  --   --   --   --   --   --  117*  ?BUN 9 10 9 9   --   --   --   --   --   --  14  ?CREATININE 1.45* 1.50* 1.61* 1.20  --   --   --   --   --   --  1.81*  ?CALCIUM 9.0  --  7.7*  --   --   --   --   --   --   --  8.7*  ?MG  --   --   --   --   --   --  2.6*  --   --   --  2.2  ?PHOS  --   --   --   --   --   --   --   --   --   --  2.4*  ? < > = values in this interval not displayed.  ? ? ?Liver Function Tests: ?Recent Labs  ?Lab 02/26/22 ?1545 02/27/22 ?0617  ?AST 138* 443*  ?ALT 133* 207*  ?ALKPHOS 61 78  ?BILITOT 0.8 1.0  ?PROT 3.8* 5.8*  ?ALBUMIN  2.2* 3.3*  ? ?No results for input(s): LIPASE, AMYLASE in the last 168 hours. ?No results for input(s): AMMONIA in the last 168 hours. ? ?CBC: ?Recent Labs  ?Lab 02/26/22 ?1431 02/26/22 ?1432 02/26/22 ?1545 02/26/22 ?1548 02/26/22 ?1749 02/26/22 ?2129 02/27/22 ?0448 02/27/22 ?[id number] 02/27/22 ?[id number]  ?WBC 15.0*  --  17.3*  --   --   --   --   --  18.6*  ?HGB 15.6   < > 12.4*   < > 15.3 13.9 14.3 14.3 14.4  ?HCT 49.3   < > 36.0*   < > 45.0 41.0 42.0 42.0 41.1  ?MCV 107.6*  --  98.6  --   --   --   --   --  94.7  ?PLT 216  --  128*  --   --   --   --   --  246  ? < > = values in this interval not displayed.  ? ? ?Cardiac Enzymes: ?No results for input(s): CKTOTAL, CKMB, CKMBINDEX, TROPONINI in the last 168 hours. ? ?BNP: ?BNP (last 3 results) ?No results for input(s): BNP in the last 8760 hours. ? ?ProBNP (last 3 results) ?No results for input(s): PROBNP in the last 8760 hours. ? ? ? ?Other results: ? ?Imaging: ?CT HEAD WO CONTRAST (5MM) ? ?Result Date: 02/27/2022 ?CLINICAL DATA:  Mental status change, unknown cause. EXAM: CT HEAD WITHOUT CONTRAST TECHNIQUE: Contiguous axial images were obtained from the base of the skull through the vertex without intravenous contrast. RADIATION DOSE REDUCTION: This exam was performed according to the departmental dose-optimization program which includes automated exposure control, adjustment of the mA and/or kV according to  patient size and/or use of iterative reconstruction technique. COMPARISON:  None. FINDINGS: Brain: No acute intracranial hemorrhage, midline shift or mass effect. No extra-axial fluid collection. Gray-white matter differentiation is within normal limits. No hydrocephalus. Mild periventricular white matter hypodensities are noted. Vascular: No hyperdense vessel or unexpected calcification. Skull: Normal. Negative for fracture or focal lesion. Sinuses/Orbits: Mucosal thickening is present in the ethmoid air cells, maxillary sinuses, right frontal sinus and right sphenoid sinus Other: None. IMPRESSION: No acute intracranial process. Electronically Signed   By: Brett Fairy M.D.   On: 02/27/2022 03:51  ? ?CARDIAC CATHETERIZATION ? ?Result Date: 02/26/2022 ?CONCLUSIONS: Anterior ST elevation MI with witnessed out of hospital ventricular fibrillation arrest.  Resuscitated by EMS after 12 minutes and multiple shocks.  Had recurrent ventricular fibrillation on 2 separate occasions.  Total duration of CPR approximately 30 minutes. 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 Widely patent left main Widely patent circumflex Widely patent codominant right coronary Severe LV dysfunction with EF 25 to 30% with regional abnormality involving the mid anterior wall and entire apex which was dyskinetic. Cardiogenic shock requiring Levophed to maintain systolic blood pressure above 90 mmHg. Acute systolic heart failure requiring milrinone and IV Lasix to help lower filling pressures RECOMMENDATIONS: Advanced heart failure consultation and management, Dr. Glori Bickers. Critical care medicine to manage postarrest state and respiratory failure. Discussion with his girlfriend and she was fully appraised of his critically ill state.  Outcome would depend upon neurological recovery and  improvement in hemodynamics and pulmonary function. Aspirin and Brilinta per orogastric tube. Discontinue IV Aggrastat 2 hours after loading dose of Brilinta is given. Continue IV Levophed and milrinone.  ? ?  DG CHEST PORT 1 VIEW ? ?Result Date: 02/27/2022 ?CLINICAL DATA:  Chest pain. EXAM: PORTABLE CHEST 1 VIEW COMPARISON:  02/26/2022. FINDINGS: The heart size and mediastinal contours are stable. There is mild pulmonary vascular congestion bilaterally. Mild atelectasis is noted at the lung bases. No consolidation, effusion, or pneumothorax. A swans Ganz catheter is stable in position. Enteric tube courses over the left upper quadrant and out of the field of view. The endotracheal tube terminates 3 cm above the carina. No acute osseous abnormality. IMPRESSION: 1. Mildly distended pulmonary vasculature without evidence of edema. 2. Medical devices as described above. Electronically Signed   By: Brett Fairy M.D.   On: 02/27/2022 00:50  ? ?DG Chest Port 1 View ? ?Result Date: 02/26/2022 ?CLINICAL DATA:  Intubated. Cardiac arrest. EXAM: PORTABLE CHEST 1 VIEW COMPARISON:  None. FINDINGS: Endotracheal tube terminates 8 cm above the carina, the level clavicles. NG tube courses off the inferior border the film. A Swan-Ganz catheter enters from the IVC in terminates in the interlobar artery, somewhat distal. Mild edema is present. No significant airspace consolidation is present. IMPRESSION: 1. Endotracheal tube terminates 8 cm above the carina. 2. Swan-Ganz catheter terminates in the interlobar artery. 3. Mild edema. Electronically Signed   By: San Morelle M.D.   On: 02/26/2022 17:26  ? ?EEG adult ? ?Result Date: 02/26/2022 ?Lora Havens, MD     02/26/2022  7:23 PM Patient Name: YOUNIS FELGAR MRN: [id number] Epilepsy Attending: Lora Havens Referring Physician/Provider: Julian Hy, DO Date:02/26/2022 Duration: 23.31 mins Patient history: 51yo m s/p cardiac arrest. EEG to evaluate for seizure Level of  alertness: lethargic AEDs during EEG study: versed Technical aspects: This EEG study was done with scalp electrodes positioned according to the 10-20 International system of electrode placement. Electrical activity was

## 2022-02-27 NOTE — Progress Notes (Signed)
?  Echocardiogram ?2D Echocardiogram has been performed. ? ?Willie Campbell ?02/27/2022, 9:30 AM ?

## 2022-02-27 NOTE — Progress Notes (Signed)
Doing well postextubation.  Sister updated at bedside. ? ?Needs tobacco cessation counseling and lung cancer screening referral at discharge. ? ?Steffanie Dunn, DO 02/27/22 10:20 AM ?[facility] ? ?

## 2022-02-27 NOTE — Progress Notes (Addendum)
eLink Physician-Brief Progress Note ?Patient Name: Willie Campbell ?DOB: [birth year] ?MRN: [id number] ? ? ?Date of Service ? 02/27/2022  ?HPI/Events of Note ? K 3.6, torsades on admit. ?Cr 1.7  ?eICU Interventions ? Keep K > 4 ?Replacement ordered.  ? ? ? ?Intervention Category ?Intermediate Interventions: Electrolyte abnormality - evaluation and management ? ?Ranee Gosselin ?02/27/2022, 8:40 PM ? ?22:55 ?DC IV protonix. S/p extubation status since 14 th and no GERD.  ?

## 2022-02-27 NOTE — Progress Notes (Signed)
? ?NAME:  SABA BOESE, MRN:  [id number], DOB:  Nov 13, 1971, LOS: 1 ?ADMISSION DATE:  02/26/2022, CONSULTATION DATE:  4/14 ?REFERRING MD:  Dr. Tamala Julian, CHIEF COMPLAINT:  Cardiac Arrest   ? ?History of Present Illness:  ?51 y/o M who presented to [facility] ER via EMS in cardiac arrest.  ? ?The patient called EMS for chest pain & "feeling unwell" for two hours.  On EMS arrival, the patient suffered a cardiac arrest.  His initial rhythm was torsades.  He had approximately 10 minutes of CPR before ROSC.  On arrival to the ER, he was in 2:1 block with HR in the 30's, ST elevation in the anterior leads.  In the ER, he developed VT/VF and was shocked.  He had two rounds of epinephrine, bolus of IV amiodarone then infusion.  He received 5000 units IV heparin, calcium gluconate.  He was intubated.  Patient had approximately 30 minutes total of CPR.  He was taken urgently to the Cath Lab for intervention.  Initial labs notable for sodium 145, K 2.7, chloride 111, CO2 15, glucose 225, BUN 9, creatinine 1.45, troponin 36, WBC 15, hemoglobin 15.6, MCV 107.6 and platelets 216.  Hemoglobin A1c 4.4.  LHC demonstrated occluded LAD s/p stent.  Returned to ICU post procedure on mechanical ventilation, sedation, swan-ganz in place.  ? ?PCCM consulted for hospital admission postcardiac arrest. ? ?Pertinent  Medical History  ?Cardiac Arrest - Torsades, VF/VT ? ?Significant Hospital Events: ?Including procedures, antibiotic start and stop dates in addition to other pertinent events   ?4/14 Admit post arrest, torsades initial rhythm.  Approx 30 min total CPR.  ? ?Interim History / Subjective:  ?This morning he denies complaints other than discomfort from his ETT. ? ?Objective   ?Blood pressure (!) 105/52, pulse 84, temperature 98.1 ?F (36.7 ?C), resp. rate 12, height 5\' 11"  (1.803 m), weight 104 kg, SpO2 99 %. ?PAP: (16-42)/(9-29) 17/10 ?CVP:  [4 mmHg-24 mmHg] 4 mmHg ?CO:  [3.8 L/min-4.6 L/min] 3.8 L/min ?CI:  [1.7 L/min/m2-2.1 L/min/m2] 1.7 L/min/m2   ?Vent Mode: CPAP;PSV ?FiO2 (%):  [40 %-100 %] 40 % ?Set Rate:  [20 bmp-28 bmp] 26 bmp ?Vt Set:  [500 mL-560 mL] 560 mL ?PEEP:  [5 cmH20-12 cmH20] 5 cmH20 ?Pressure Support:  [8 cmH20] 8 cmH20 ?Plateau Pressure:  [18 cmH20-23 cmH20] 21 cmH20  ? ?Intake/Output Summary (Last 24 hours) at 02/27/2022 0734 ?Last data filed at 02/27/2022 0600 ?Gross per 24 hour  ?Intake 1247.91 ml  ?Output 2650 ml  ?Net -1402.09 ml  ? ?Filed Weights  ? 02/26/22 1600  ?Weight: 104 kg  ? ? ?Examination: ?General: critically ill appearing man lying in bed in NAD ?HENT: [place]/AT, eyes anicteric ?Lungs: CTAB, minimal bloody Ett secretions. Breathing comfortably on PS 8, CPAP 5  ?Cardiovascular:  S1S2, RRR ?Abdomen: soft, NT  ?Extremities: no peripheral edema, no cyanosis ?Neuro: RASS -1, able to lift head off the pillow, follows commands. ? ?BUN 14 ?Cr 1.81 ?Phos 2.4 ?AST 443 ?ALT 207 ?Coox pending ? ?Echo pending ? ?EEG> moderate to severe diffuse encephalopathy, no seizures or epileptiform discharges ? ?Resolved Hospital Problem list   ?hypokalemia  ?Lactic acidosis ?Acute encephalopathy; no myoclonus or seizures ? ?Assessment & Plan:  ? ?Cardiac Arrest- Initial rhythm torsades, then VF/VT s/p shock, total CPR ~ 30 minutes ?STEMI - s/p DES to LAD ?Acute HFrEF (25-30%) with cardiogenic shock ?-DAPT, statin ?-needs cooximetry resent this morning; hemodynamic monitoring per Luiz Blare ?-con't milrinone, NE ?-tele monitoring ?-monitor electrolytes and replete PRN ?-  unable to start GDMT yet due to shock and AKI ?-echo pending ? ?Acute respiratory failure with hypoxia secondary to cardiac arrest  ?Acute Hypercarbia Post Arrest  ?Aspiration pneumonia vs pneumonitis ?Acute pulmonary edema ?-PRVC ?-VAP prevention protocol ?-PAD protocol for sedation ?-daily SAT & SBT; anticipate extubation today ?-duonebs PRN ? ?Hypophosphatemia ?-replete ?-monitor ? ?Hyperglycemia due to stress response ?Hgb A1c 4.4 ?-SSI PRN ?-goal BG <180 ? ?AKI ?-strict I/Os ?-renally dose  meds, avoid nephrotoxic meds ?-monitor ? ?Elevated transaminases due to shock liver ?-monitor ?-maintain adequate perfusion, prevent hypervolemia ? ?Best Practice (right click and "Reselect all SmartList Selections" daily)  ?Diet/type: NPO ?DVT prophylaxis: prophylactic heparin  ?GI prophylaxis: PPI ?Lines: Central line ?Foley:  Yes, and it is still needed ?Code Status:  full code ?Last date of multidisciplinary goals of care discussion: full scope ? ?Labs   ?CBC: ?Recent Labs  ?Lab 02/26/22 ?1431 02/26/22 ?1432 02/26/22 ?1545 02/26/22 ?1548 02/26/22 ?1749 02/26/22 ?2129 02/27/22 ?0448 02/27/22 ?[id number] 02/27/22 ?[id number]  ?WBC 15.0*  --  17.3*  --   --   --   --   --  18.6*  ?HGB 15.6   < > 12.4*   < > 15.3 13.9 14.3 14.3 14.4  ?HCT 49.3   < > 36.0*   < > 45.0 41.0 42.0 42.0 41.1  ?MCV 107.6*  --  98.6  --   --   --   --   --  94.7  ?PLT 216  --  128*  --   --   --   --   --  246  ? < > = values in this interval not displayed.  ? ? ? ?Basic Metabolic Panel: ?Recent Labs  ?Lab 02/26/22 ?1431 02/26/22 ?1432 02/26/22 ?1545 02/26/22 ?1548 02/26/22 ?1556 02/26/22 ?1749 02/26/22 ?1954 02/26/22 ?2129 02/27/22 ?0448 02/27/22 ?[id number] 02/27/22 ?[id number]  ?NA 145 145 136 131*   < > 138  --  141 141 142 143  ?K 3.3* 2.7* 3.8 3.6   < > 4.7  --  4.1 4.1 3.9 4.1  ?CL 111 106 107 96*  --   --   --   --   --   --  111  ?CO2 15*  --  15*  --   --   --   --   --   --   --  25  ?GLUCOSE 225* 208* 359* 317*  --   --   --   --   --   --  117*  ?BUN 9 10 9 9   --   --   --   --   --   --  14  ?CREATININE 1.45* 1.50* 1.61* 1.20  --   --   --   --   --   --  1.81*  ?CALCIUM 9.0  --  7.7*  --   --   --   --   --   --   --  8.7*  ?MG  --   --   --   --   --   --  2.6*  --   --   --  2.2  ?PHOS  --   --   --   --   --   --   --   --   --   --  2.4*  ? < > = values in this interval not displayed.  ? ? ?GFR: ?Estimated Creatinine Clearance: 59.9 mL/min (A) (by C-G formula based  on SCr of 1.81 mg/dL (H)). ?Recent Labs  ?Lab 02/26/22 ?1431 02/26/22 ?1520  02/26/22 ?1545 02/26/22 ?1757 02/27/22 ?0617  ?WBC 15.0*  --  17.3*  --  18.6*  ?LATICACIDVEN  --  >9.0*  --  4.4*  --   ? ? ? ?Liver Function Tests: ?Recent Labs  ?Lab 02/26/22 ?1545 02/27/22 ?0617  ?AST 138* 443*  ?ALT 133* 207*  ?ALKPHOS 61 78  ?BILITOT 0.8 1.0  ?PROT 3.8* 5.8*  ?ALBUMIN 2.2* 3.3*  ? ?No results for input(s): LIPASE, AMYLASE in the last 168 hours. ?No results for input(s): AMMONIA in the last 168 hours. ? ?ABG ?   ?Component Value Date/Time  ? PHART 7.446 02/27/2022 0448  ? PCO2ART 35.1 02/27/2022 0448  ? PO2ART 148 (H) 02/27/2022 0448  ? HCO3 24.1 02/27/2022 0452  ? TCO2 25 02/27/2022 0452  ? ACIDBASEDEF 1.0 02/26/2022 2129  ? O2SAT 79 02/27/2022 0452  ? ?  ? ?Coagulation Profile: ?Recent Labs  ?Lab 02/26/22 ?1431 02/26/22 ?1545 02/26/22 ?1754  ?INR 1.2 1.7* 1.3*  ? ? ? ?This patient is critically ill with multiple organ system failure which requires frequent high complexity decision making, assessment, support, evaluation, and titration of therapies. This was completed through the application of advanced monitoring technologies and extensive interpretation of multiple databases. During this encounter critical care time was devoted to patient care services described in this note for 45 minutes. ? ?Julian Hy, DO 02/27/22 9:03 AM ?[facility] ? ? ? ?

## 2022-02-27 NOTE — Procedures (Signed)
Extubation Procedure Note ? ?Patient Details:   ?Name: Willie Campbell ?DOB: [birth year] ?MRN: [id number] ?  ?Airway Documentation:  ?  ?Vent end date: 02/27/22 Vent end time: 0949  ? ?Evaluation ? O2 sats: stable throughout ?Complications: No apparent complications ?Patient did tolerate procedure well. ?Bilateral Breath Sounds: Clear ? Patient able to speak: ?Yes ? ?Peggye Form ?02/27/2022, 9:51 AM ? ?

## 2022-02-27 NOTE — Plan of Care (Signed)

## 2022-02-27 NOTE — Progress Notes (Incomplete)
{  Select Note:[id number]} 

## 2022-02-28 DIAGNOSIS — Z720 Tobacco use: Secondary | ICD-10-CM

## 2022-02-28 DIAGNOSIS — I213 ST elevation (STEMI) myocardial infarction of unspecified site: Secondary | ICD-10-CM

## 2022-02-28 LAB — CBC
HCT: 40.5 % (ref 39.0–52.0)
Hemoglobin: 14.2 g/dL (ref 13.0–17.0)
MCH: 33.4 pg (ref 26.0–34.0)
MCHC: 35.1 g/dL (ref 30.0–36.0)
MCV: 95.3 fL (ref 80.0–100.0)
Platelets: 193 10*3/uL (ref 150–400)
RBC: 4.25 MIL/uL (ref 4.22–5.81)
RDW: 11.8 % (ref 11.5–15.5)
WBC: 16.1 10*3/uL — ABNORMAL HIGH (ref 4.0–10.5)
nRBC: 0 % (ref 0.0–0.2)

## 2022-02-28 LAB — COOXEMETRY PANEL
Carboxyhemoglobin: 2.7 % — ABNORMAL HIGH (ref 0.5–1.5)
Methemoglobin: <0.7 % (ref 0.0–1.5)
O2 Saturation: 78.7 %
Total hemoglobin: 14.4 g/dL (ref 12.0–16.0)

## 2022-02-28 LAB — GLUCOSE, CAPILLARY
Glucose-Capillary: 157 mg/dL — ABNORMAL HIGH (ref 70–99)
Glucose-Capillary: 89 mg/dL (ref 70–99)
Glucose-Capillary: 94 mg/dL (ref 70–99)

## 2022-02-28 LAB — BASIC METABOLIC PANEL
Anion gap: 8 (ref 5–15)
BUN: 11 mg/dL (ref 6–20)
CO2: 26 mmol/L (ref 22–32)
Calcium: 8.6 mg/dL — ABNORMAL LOW (ref 8.9–10.3)
Chloride: 103 mmol/L (ref 98–111)
Creatinine, Ser: 1.57 mg/dL — ABNORMAL HIGH (ref 0.61–1.24)
GFR, Estimated: 53 mL/min — ABNORMAL LOW (ref >60)
Glucose, Bld: 92 mg/dL (ref 70–99)
Potassium: 3.6 mmol/L (ref 3.5–5.1)
Sodium: 137 mmol/L (ref 135–145)

## 2022-02-28 LAB — HEPATIC FUNCTION PANEL
ALT: 160 U/L — ABNORMAL HIGH (ref 0–44)
AST: 265 U/L — ABNORMAL HIGH (ref 15–41)
Albumin: 3.3 g/dL — ABNORMAL LOW (ref 3.5–5.0)
Alkaline Phosphatase: 75 U/L (ref 38–126)
Bilirubin, Direct: 0.3 mg/dL — ABNORMAL HIGH (ref 0.0–0.2)
Indirect Bilirubin: 1 mg/dL — ABNORMAL HIGH (ref 0.3–0.9)
Total Bilirubin: 1.3 mg/dL — ABNORMAL HIGH (ref 0.3–1.2)
Total Protein: 5.9 g/dL — ABNORMAL LOW (ref 6.5–8.1)

## 2022-02-28 LAB — PHOSPHORUS: Phosphorus: 3 mg/dL (ref 2.5–4.6)

## 2022-02-28 LAB — MAGNESIUM: Magnesium: 1.9 mg/dL (ref 1.7–2.4)

## 2022-02-28 LAB — LIPOPROTEIN A (LPA): Lipoprotein (a): 101.2 nmol/L — ABNORMAL HIGH (ref <75.0)

## 2022-02-28 MED ORDER — PANTOPRAZOLE SODIUM 40 MG PO TBEC
40.0000 mg | DELAYED_RELEASE_TABLET | Freq: Every day | ORAL | Status: DC
  Administered 2022-02-28 – 2022-03-01 (×2): 40 mg via ORAL
  Filled 2022-02-28 (×2): qty 1

## 2022-02-28 MED ORDER — EMPAGLIFLOZIN 10 MG PO TABS
10.0000 mg | ORAL_TABLET | Freq: Every day | ORAL | Status: DC
  Administered 2022-02-28 – 2022-03-01 (×2): 10 mg via ORAL
  Filled 2022-02-28 (×2): qty 1

## 2022-02-28 MED ORDER — DOCUSATE SODIUM 50 MG/5ML PO LIQD: 100.0000 mg | Freq: Two times a day (BID) | ORAL | Status: DC | PRN

## 2022-02-28 MED ORDER — TICAGRELOR 90 MG PO TABS
90.0000 mg | ORAL_TABLET | Freq: Two times a day (BID) | ORAL | Status: DC
  Administered 2022-02-28 – 2022-03-01 (×3): 90 mg via ORAL
  Filled 2022-02-28 (×2): qty 1

## 2022-02-28 MED ORDER — MAGNESIUM SULFATE 2 GM/50ML IV SOLN
2.0000 g | Freq: Once | INTRAVENOUS | Status: AC
  Administered 2022-02-28: 2 g via INTRAVENOUS
  Filled 2022-02-28: qty 50

## 2022-02-28 MED ORDER — ASPIRIN 81 MG PO CHEW
81.0000 mg | CHEWABLE_TABLET | Freq: Every day | ORAL | Status: DC
  Administered 2022-02-28 – 2022-03-01 (×2): 81 mg via ORAL
  Filled 2022-02-28: qty 1

## 2022-02-28 MED ORDER — POTASSIUM CHLORIDE CRYS ER 20 MEQ PO TBCR
40.0000 meq | EXTENDED_RELEASE_TABLET | Freq: Once | ORAL | Status: AC
  Administered 2022-02-28: 40 meq via ORAL
  Filled 2022-02-28: qty 2

## 2022-02-28 MED ORDER — ATORVASTATIN CALCIUM 40 MG PO TABS
40.0000 mg | ORAL_TABLET | Freq: Every day | ORAL | Status: DC
  Administered 2022-02-28 – 2022-03-01 (×2): 40 mg via ORAL
  Filled 2022-02-28: qty 1

## 2022-02-28 MED ORDER — POLYETHYLENE GLYCOL 3350 17 G PO PACK: 17.0000 g | PACK | Freq: Every day | ORAL | Status: DC | PRN

## 2022-02-28 MED ORDER — POLYETHYLENE GLYCOL 3350 17 G PO PACK
17.0000 g | PACK | Freq: Every day | ORAL | Status: DC
  Filled 2022-02-28: qty 1

## 2022-02-28 NOTE — Progress Notes (Signed)
[facility] ADULT ICU REPLACEMENT PROTOCOL ? ? ?The patient does apply for the [facility] Adult ICU Electrolyte Replacment Protocol based on the criteria listed below:  ? ?1.Exclusion criteria: TCTS patients, ECMO patients, and Dialysis patients ?2. Is GFR >/= 30 ml/min? Yes.    ?Patient's GFR today is 53 ?3. Is SCr </= 2? Yes.   ?Patient's SCr is 1.57 mg/dL ?4. Did SCr increase >/= 0.5 in 24 hours? No. ?5.Pt's weight >40kg  Yes.   ?6. Abnormal electrolyte(s): K, Mag  ?7. Electrolytes replaced per protocol ?8.  Call MD STAT for K+ </= 2.5, Phos </= 1, or Mag </= 1 ?Physician:  Marlane Mingle ? ?Willie Campbell 02/28/2022 5:22 AM  ?

## 2022-02-28 NOTE — Evaluation (Addendum)
Physical Therapy Evaluation & Discharge ?Patient Details ?Name: Willie Campbell ?MRN: [id number] ?DOB: 1971/02/25 ?Today's Date: 02/28/2022 ? ?History of Present Illness ? Pt is a 51 y.o. male who presented 02/26/22 after going into cardiac arrest shortly after EMS arrived secondary to chest pain. He was initially resuscitated for 10 min, then had a recurrent arrest for another ~ 20 min. Had 2 rounds of Epi and given IV bolus of amiodarone and then drip. EKG demonstrated anterior STEMI and he underwent LHC. Pt with totally occluded LAD with EF 20-25%. LAD opened with PCI/DES 4/14. No seizures seen on EEG. CT of head negative. ETT 4/14 - 4/15. PMH: tobaccor abuse and hernia repair ?  ?Clinical Impression ? Pt presents with condition above. PTA, he was living with a friend in a 1-level apartment with a level entry. 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 Currently, pt is at his baseline, displaying safe and WFL balance, strength, and gait pattern. His DGI score was 24 (assuming 3/3 for stairs) today, indicating pt is not at risk for falls. He was IND without UE support, LOB, or assistance for all mobility. Pt was oriented to self, location, and situation but had difficulty identifying the current date. Otherwise, he seemed [facility] cognitively for tasks assessed. Pt only with mild deficits in activity tolerance/endurance, but suspect this will improve as he continues to mobilize. All education completed and questions answered. PT will sign off. ?   ? ?Recommendations for follow up therapy are one component of a multi-disciplinary discharge planning process, led by the attending physician.  Recommendations may be updated based on patient status, additional functional criteria and insurance authorization. ? ?Follow Up Recommendations No PT follow up ? ?  ?Assistance Recommended at Discharge None  ?Patient can return home with the following ?  (N/A) ? ?  ?Equipment Recommendations None recommended by  PT  ?Recommendations for Other Services ?    ?  ?Functional Status Assessment Patient has not had a recent decline in their functional status  ? ?  ?Precautions / Restrictions Precautions ?Precautions: None ?Restrictions ?Weight Bearing Restrictions: No  ? ?  ? ?Mobility ? Bed Mobility ?Overal bed mobility: Independent ?  ?  ?  ?  ?  ?  ?General bed mobility comments: Pt able to transition supine <> sit without assistance, minor difficulty due to sternum pain. Educated pt on rolling and transitioning sidelying > sit if needed to manage pain. ?  ? ?Transfers ?Overall transfer level: Independent ?Equipment used: None ?Transfers: Sit to/from Stand ?Sit to Stand: Independent ?  ?  ?  ?  ?  ?General transfer comment: Comes to stand quickly and safely from EOB without assistance. ?  ? ?Ambulation/Gait ?Ambulation/Gait assistance: Independent ?Gait Distance (Feet): 335 Feet ?Assistive device: None ?Gait Pattern/deviations: WFL(Within Functional Limits) ?Gait velocity: WNL ?Gait velocity interpretation: >4.37 ft/sec, indicative of normal walking speed ?  ?General Gait Details: Pt ambulates at appropriate speed with no significant gait deviations or noted balance deficits. ? ?Stairs ?  ?  ?  ?  ?  ? ?Wheelchair Mobility ?  ? ?Modified Rankin (Stroke Patients Only) ?  ? ?  ? ?Balance Overall balance assessment: No apparent balance deficits (not formally assessed) ?  ?  ?  ?  ?  ?  ?  ?  ?  ?  ?  ?  ?  ?  ?  ?Standardized Balance Assessment ?Standardized Balance Assessment : Dynamic Gait Index ?  ?Dynamic  Gait Index ?Level Surface: Normal ?Change in Gait Speed: Normal ?Gait with Horizontal Head Turns: Normal ?Gait with Vertical Head Turns: Normal ?Gait and Pivot Turn: Normal ?Step Over Obstacle: Normal ?Step Around Obstacles: Normal ?Steps: Normal (assumed, not tested) ?Total Score: 24 ?   ? ? ? ?Pertinent Vitals/Pain Pain Assessment ?Pain Assessment: Faces ?Faces Pain Scale: Hurts even more ?Pain Location: sternum ?Pain  Descriptors / Indicators: Discomfort, Grimacing, Guarding ?Pain Intervention(s): Limited activity within patient's tolerance, Monitored during session, Repositioned, Other (comment) (educated to hug pillow with coughing/sneezing)  ? ? ?Home Living Family/patient expects to be discharged to:: Private residence ?Living Arrangements: Non-relatives/Friends ?Available Help at Discharge: Friend(s);Available PRN/intermittently ?Type of Home: Apartment ?Home Access: Level entry ?  ?  ?  ?Home Layout: One level ?Home Equipment: None ?   ?  ?Prior Function Prior Level of Function : Independent/Modified Independent;Driving ?  ?  ?  ?  ?  ?  ?  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  ? ? ?Hand Dominance  ?   ? ?  ?Extremity/Trunk Assessment  ? Upper Extremity Assessment ?Upper Extremity Assessment: Overall WFL for tasks assessed ?  ? ?Lower Extremity Assessment ?Lower Extremity Assessment: Overall WFL for tasks assessed ?  ? ?Cervical / Trunk Assessment ?Cervical / Trunk Assessment: Normal  ?Communication  ? Communication: Other (comment) (tongue swollen, reportedly following being extubated, causing some mild speech difficulties/slurring)  ?Cognition Arousal/Alertness: Awake/alert ?Behavior During Therapy: [facility] for tasks assessed/performed ?Overall Cognitive Status: Within Functional Limits for tasks assessed ?  ?  ?  ?  ?  ?  ?  ?  ?  ?  ?  ?  ?  ?  ?  ?  ?General Comments: Pt having difficulty identifying the date, but with extra time was able to state "April 2023". Oriented to location, self, and situation. Follows commands appropriately and appears to be safe. ?  ?  ? ?  ?General Comments General comments (skin integrity, edema, etc.): encouraged pt to mobilize/take a walk >/= 3x/day and notified RN pt is safe to mobilize IND; educated pt to hug pillow to splint sternum with coughing/sneezing for pain management; encouraged deep breathing to reduce risk for PNA ? ?  ?Exercises    ? ?Assessment/Plan  ?  ?PT Assessment Patient does  not need any further PT services  ?PT Problem List   ? ?   ?  ?PT Treatment Interventions     ? ?PT Goals (Current goals can be found in the Care Plan section)  ?Acute Rehab PT Goals ?Patient Stated Goal: to recover his endurance ?PT Goal Formulation: All assessment and education complete, DC therapy ?Time For Goal Achievement: 03/01/22 ?Potential to Achieve Goals: Good ? ?  ?Frequency   ?  ? ? ?Co-evaluation   ?  ?  ?  ?  ? ? ?  ?AM-PAC PT "6 Clicks" Mobility  ?Outcome Measure Help needed turning from your back to your side while in a flat bed without using bedrails?: None ?Help needed moving from lying on your back to sitting on the side of a flat bed without using bedrails?: None ?Help needed moving to and from a bed to a chair (including a wheelchair)?: None ?Help needed standing up from a chair using your arms (e.g., wheelchair or bedside chair)?: None ?Help needed to walk in hospital room?: None ?Help needed climbing 3-5 steps with a railing? : None ?6 Click Score: 24 ? ?  ?End of Session   ?Activity Tolerance: Patient  tolerated treatment well ?Patient left: with call bell/phone within reach;in bed ?Nurse Communication: Mobility status (pt IND) ?PT Visit Diagnosis: Pain ?Pain - part of body:  (sternum) ?  ? ?Time: [id number] ?PT Time Calculation (min) (ACUTE ONLY): 12 min ? ? ?Charges:   PT Evaluation ?$PT Eval Low Complexity: 1 Low ?  ?  ?   ? ? ?Moishe Spice, PT, DPT ?Acute Rehabilitation Services  ?Pager: [phone] ?Office: [phone] ? ? ?Maretta Bees Pettis ?02/28/2022, 1:46 PM ? ?

## 2022-02-28 NOTE — Progress Notes (Signed)
? ?NAME:  Willie Campbell, MRN:  [id number], DOB:  May 14, 1971, LOS: 2 ?ADMISSION DATE:  02/26/2022, CONSULTATION DATE:  4/14 ?REFERRING MD:  Dr. Tamala Julian, CHIEF COMPLAINT:  Cardiac Arrest   ? ?History of Present Illness:  ?51 y/o M who presented to [facility] ER via EMS in cardiac arrest.  ? ?The patient called EMS for chest pain & "feeling unwell" for two hours.  On EMS arrival, the patient suffered a cardiac arrest.  His initial rhythm was torsades.  He had approximately 10 minutes of CPR before ROSC.  On arrival to the ER, he was in 2:1 block with HR in the 30's, ST elevation in the anterior leads.  In the ER, he developed VT/VF and was shocked.  He had two rounds of epinephrine, bolus of IV amiodarone then infusion.  He received 5000 units IV heparin, calcium gluconate.  He was intubated.  Patient had approximately 30 minutes total of CPR.  He was taken urgently to the Cath Lab for intervention.  Initial labs notable for sodium 145, K 2.7, chloride 111, CO2 15, glucose 225, BUN 9, creatinine 1.45, troponin 36, WBC 15, hemoglobin 15.6, MCV 107.6 and platelets 216.  Hemoglobin A1c 4.4.  LHC demonstrated occluded LAD s/p stent.  Returned to ICU post procedure on mechanical ventilation, sedation, swan-ganz in place.  ? ?PCCM consulted for hospital admission postcardiac arrest. ? ?Pertinent  Medical History  ?Cardiac Arrest - Torsades, VF/VT ? ?Significant Hospital Events: ?Including procedures, antibiotic start and stop dates in addition to other pertinent events   ?4/14 Admit post arrest, torsades initial rhythm.  Approx 30 min total CPR.  ? ?Interim History / Subjective:  ?This morning he feels well other than some pain from chest compressions.  ? ?Objective   ?Blood pressure 106/89, pulse 85, temperature 98.5 ?F (36.9 ?C), temperature source Oral, resp. rate 15, height 5\' 11"  (1.803 m), weight 103.8 kg, SpO2 97 %. ?   ?   ? ?Intake/Output Summary (Last 24 hours) at 02/28/2022 1217 ?Last data filed at 02/28/2022 0800 ?Gross per 24  hour  ?Intake 965.97 ml  ?Output 400 ml  ?Net 565.97 ml  ? ? ?Filed Weights  ? 02/26/22 1600 02/28/22 0500  ?Weight: 104 kg 103.8 kg  ? ? ?Examination: ?General: healthy appearing man sitting in the recliner in NAD ?HENT: [place]/AT, eyes anicteric ?Lungs: breathing comfortably on RA, CTAB ?Cardiovascular:  S1S2, RRR ?Abdomen: soft, NT, ND ?Extremities: no cyanosis or edema ?Neuro: awake, alert, moving all extremities, normal speech. Answering questions appropriately. ? ?BUN 11 ?Cr 1.57 ?Phos 3.0 ?Mg+ 1.9 ?AST 265 ?ALT 160 ?Coox 78.7 ? ?Echo LVEF 30-35%, akinesis of anteroseptal, inferoseptal, apex, mid-anterior and mid-inferior LV hypokinetic. G1DD. Normal RV size and function ? ?Resolved Hospital Problem list   ?hypokalemia  ?Lactic acidosis ?Acute encephalopathy; no myoclonus or seizures ? ?Assessment & Plan:  ? ?Cardiac arrest- Initial rhythm torsades, then VF/VT s/p shock, total CPR ~ 30 minutes ?STEMI - s/p DES to LAD ?Acute HFrEF (25-30%) with cardiogenic shock ?-DAPT, statin, ARB, spironolactone ?-jardiance starting today ?-start Bblocker per cardiology ?-tele monitoring ?-monitor electrolytes and replete PRN ? ?Acute respiratory failure with hypoxia secondary to cardiac arrest, resolved ?Aspiration pneumonia vs pneumonitis of RML ?Acute pulmonary edema ?History of ongoing tobacco abuse PTA ?-5 days of antibiotics ?-pulmonary hygiene ?-counseled on the importance of quitting smoking; he has stopped cold [place] before and plans to do that again. Currently not having cravings.  ?-referral placed for outpatient lung cancer screening ? ?Hypophosphatemia, resolved ? ?  Hyperglycemia due to stress response ?Hgb A1c 4.4 ?-has had minimal insulin requirements ? ?AKI, improving ?-strict I/Os ?-watch K+ and renal function with ARB & spiro ?-renally dose meds, avoid nephrotoxic meds ? ?Elevated transaminases & hyperbilirubinemia due to shock liver ?-monitor ? ?Transferring out of ICU today, Cardiology to take over as primary.  PCCM available as needed.  ? ?Best Practice (right click and "Reselect all SmartList Selections" daily)  ?Diet/type: Regular consistency (see orders) ?DVT prophylaxis: prophylactic heparin  ?GI prophylaxis: PPI ?Lines: N/A ?Foley:  N/A ?Code Status:  full code ?Last date of multidisciplinary goals of care discussion: full scope ? ?Labs   ?CBC: ?Recent Labs  ?Lab 02/26/22 ?1431 02/26/22 ?1432 02/26/22 ?1545 02/26/22 ?1548 02/26/22 ?2129 02/27/22 ?0448 02/27/22 ?[id number] 02/27/22 ?[id number] 02/28/22 ?0403  ?WBC 15.0*  --  17.3*  --   --   --   --  18.6* 16.1*  ?HGB 15.6   < > 12.4*   < > 13.9 14.3 14.3 14.4 14.2  ?HCT 49.3   < > 36.0*   < > 41.0 42.0 42.0 41.1 40.5  ?MCV 107.6*  --  98.6  --   --   --   --  94.7 95.3  ?PLT 216  --  128*  --   --   --   --  246 193  ? < > = values in this interval not displayed.  ? ? ? ?Basic Metabolic Panel: ?Recent Labs  ?Lab 02/26/22 ?1431 02/26/22 ?1432 02/26/22 ?1545 02/26/22 ?1548 02/26/22 ?1556 02/26/22 ?1954 02/26/22 ?2129 02/27/22 ?0448 02/27/22 ?[id number] 02/27/22 ?0617 02/27/22 ?[id number] 02/28/22 ?0403  ?NA 145   < > 136 131*   < >  --    < > 141 142 143 139 137  ?K 3.3*   < > 3.8 3.6   < >  --    < > 4.1 3.9 4.1 3.6 3.6  ?CL 111   < > 107 96*  --   --   --   --   --  111 100 103  ?CO2 15*  --  15*  --   --   --   --   --   --  25 27 26   ?GLUCOSE 225*   < > 359* 317*  --   --   --   --   --  117* 123* 92  ?BUN 9   < > 9 9  --   --   --   --   --  14 12 11   ?CREATININE 1.45*   < > 1.61* 1.20  --   --   --   --   --  1.81* 1.75* 1.57*  ?CALCIUM 9.0  --  7.7*  --   --   --   --   --   --  8.7* 8.7* 8.6*  ?MG  --   --   --   --   --  2.6*  --   --   --  2.2 2.0 1.9  ?PHOS  --   --   --   --   --   --   --   --   --  2.4*  --  3.0  ? < > = values in this interval not displayed.  ? ? ?GFR: ?Estimated Creatinine Clearance: 69 mL/min (A) (by C-G formula based on SCr of 1.57 mg/dL (H)). ?Recent Labs  ?Lab 02/26/22 ?1431 02/26/22 ?1520 02/26/22 ?1545 02/26/22 ?1757 02/27/22 ?0617 02/28/22 ?  0403  ?WBC  15.0*  --  17.3*  --  18.6* 16.1*  ?LATICACIDVEN  --  >9.0*  --  4.4*  --   --   ? ? ? ?Liver Function Tests: ?Recent Labs  ?Lab 02/26/22 ?1545 02/27/22 ?0617 02/28/22 ?0403  ?AST 138* 443* 265*  ?ALT 133* 207* 160*  ?ALKPHOS 61 78 75  ?BILITOT 0.8 1.0 1.3*  ?PROT 3.8* 5.8* 5.9*  ?ALBUMIN 2.2* 3.3* 3.3*  ? ? ?No results for input(s): LIPASE, AMYLASE in the last 168 hours. ?No results for input(s): AMMONIA in the last 168 hours. ? ?ABG ?   ?Component Value Date/Time  ? PHART 7.446 02/27/2022 0448  ? PCO2ART 35.1 02/27/2022 0448  ? PO2ART 148 (H) 02/27/2022 0448  ? HCO3 24.1 02/27/2022 0452  ? TCO2 25 02/27/2022 0452  ? ACIDBASEDEF 1.0 02/26/2022 2129  ? O2SAT 78.7 02/28/2022 0409  ? ?  ? ?Coagulation Profile: ?Recent Labs  ?Lab 02/26/22 ?1431 02/26/22 ?1545 02/26/22 ?1754  ?INR 1.2 1.7* 1.3*  ? ? ? ? ?Julian Hy, DO 02/28/22 12:17 PM ? Pulmonary & Critical Care ? ? ? ?

## 2022-02-28 NOTE — Progress Notes (Addendum)
? ? ?Advanced Heart Failure Rounding Note ? ? ?Subjective:   ? ?Sitting up in chair. Chest sore.  ? ?Denies SOB, orthopnea or PND.  ? ?Objective:   ?Weight Range: ? ?Vital Signs:   ?Temp:  [97.8 ?F (36.6 ?C)-98.8 ?F (37.1 ?C)] 98.5 ?F (36.9 ?C) (04/16 1144) ?Pulse Rate:  [73-94] 85 (04/16 0800) ?Resp:  [12-26] 15 (04/16 0700) ?BP: (92-119)/(60-99) 106/89 (04/16 0800) ?SpO2:  [90 %-99 %] 97 % (04/16 0800) ?Weight:  [103.8 kg] 103.8 kg (04/16 0500) ?Last BM Date : 02/28/22 ? ?Weight change: ?Filed Weights  ? 02/26/22 1600 02/28/22 0500  ?Weight: 104 kg 103.8 kg  ? ? ?Intake/Output:  ? ?Intake/Output Summary (Last 24 hours) at 02/28/2022 1147 ?Last data filed at 02/28/2022 0800 ?Gross per 24 hour  ?Intake 1115.97 ml  ?Output 1900 ml  ?Net -784.03 ml  ? ?  ? ?Physical Exam: ?General:  Well appearing. No resp difficulty ?HEENT: normal ?Neck: supple. no JVD. Carotids 2+ bilat; no bruits. No lymphadenopathy or thryomegaly appreciated. ?Cor: PMI nondisplaced. Regular rate & rhythm. No rubs, gallops or murmurs. ?Lungs: clear ?Abdomen: soft, nontender, nondistended. No hepatosplenomegaly. No bruits or masses. Good bowel sounds. ?Extremities: no cyanosis, clubbing, rash, edema ?Neuro: alert & orientedx3, cranial nerves grossly intact. moves all 4 extremities w/o difficulty. Affect pleasant ? ?Telemetry: Sinus 80-90 Personally reviewed ? ?Labs: ?Basic Metabolic Panel: ?Recent Labs  ?Lab 02/26/22 ?1431 02/26/22 ?1432 02/26/22 ?1545 02/26/22 ?1548 02/26/22 ?1556 02/26/22 ?1954 02/26/22 ?2129 02/27/22 ?0448 02/27/22 ?[id number] 02/27/22 ?0617 02/27/22 ?[id number] 02/28/22 ?0403  ?NA 145   < > 136 131*   < >  --    < > 141 142 143 139 137  ?K 3.3*   < > 3.8 3.6   < >  --    < > 4.1 3.9 4.1 3.6 3.6  ?CL 111   < > 107 96*  --   --   --   --   --  111 100 103  ?CO2 15*  --  15*  --   --   --   --   --   --  25 27 26   ?GLUCOSE 225*   < > 359* 317*  --   --   --   --   --  117* 123* 92  ?BUN 9   < > 9 9  --   --   --   --   --  14 12 11   ?CREATININE  1.45*   < > 1.61* 1.20  --   --   --   --   --  1.81* 1.75* 1.57*  ?CALCIUM 9.0  --  7.7*  --   --   --   --   --   --  8.7* 8.7* 8.6*  ?MG  --   --   --   --   --  2.6*  --   --   --  2.2 2.0 1.9  ?PHOS  --   --   --   --   --   --   --   --   --  2.4*  --  3.0  ? < > = values in this interval not displayed.  ? ? ? ?Liver Function Tests: ?Recent Labs  ?Lab 02/26/22 ?1545 02/27/22 ?0617 02/28/22 ?0403  ?AST 138* 443* 265*  ?ALT 133* 207* 160*  ?ALKPHOS 61 78 75  ?BILITOT 0.8 1.0 1.3*  ?PROT 3.8* 5.8* 5.9*  ?ALBUMIN 2.2* 3.3*  3.3*  ? ? ?No results for input(s): LIPASE, AMYLASE in the last 168 hours. ?No results for input(s): AMMONIA in the last 168 hours. ? ?CBC: ?Recent Labs  ?Lab 02/26/22 ?1431 02/26/22 ?1432 02/26/22 ?1545 02/26/22 ?1548 02/26/22 ?2129 02/27/22 ?0448 02/27/22 ?[id number] 02/27/22 ?[id number] 02/28/22 ?0403  ?WBC 15.0*  --  17.3*  --   --   --   --  18.6* 16.1*  ?HGB 15.6   < > 12.4*   < > 13.9 14.3 14.3 14.4 14.2  ?HCT 49.3   < > 36.0*   < > 41.0 42.0 42.0 41.1 40.5  ?MCV 107.6*  --  98.6  --   --   --   --  94.7 95.3  ?PLT 216  --  128*  --   --   --   --  246 193  ? < > = values in this interval not displayed.  ? ? ? ?Cardiac Enzymes: ?No results for input(s): CKTOTAL, CKMB, CKMBINDEX, TROPONINI in the last 168 hours. ? ?BNP: ?BNP (last 3 results) ?No results for input(s): BNP in the last 8760 hours. ? ?ProBNP (last 3 results) ?No results for input(s): PROBNP in the last 8760 hours. ? ? ? ?Other results: ? ?Imaging: ?CT HEAD WO CONTRAST (5MM) ? ?Result Date: 02/27/2022 ?CLINICAL DATA:  Mental status change, unknown cause. EXAM: CT HEAD WITHOUT CONTRAST TECHNIQUE: Contiguous axial images were obtained from the base of the skull through the vertex without intravenous contrast. RADIATION DOSE REDUCTION: This exam was performed according to the departmental dose-optimization program which includes automated exposure control, adjustment of the mA and/or kV according to patient size and/or use of iterative  reconstruction technique. COMPARISON:  None. FINDINGS: Brain: No acute intracranial hemorrhage, midline shift or mass effect. No extra-axial fluid collection. Gray-white matter differentiation is within normal limits. No hydrocephalus. Mild periventricular white matter hypodensities are noted. Vascular: No hyperdense vessel or unexpected calcification. Skull: Normal. Negative for fracture or focal lesion. Sinuses/Orbits: Mucosal thickening is present in the ethmoid air cells, maxillary sinuses, right frontal sinus and right sphenoid sinus Other: None. IMPRESSION: No acute intracranial process. Electronically Signed   By: Brett Fairy M.D.   On: 02/27/2022 03:51  ? ?CARDIAC CATHETERIZATION ? ?Result Date: 02/26/2022 ?CONCLUSIONS: Anterior ST elevation MI with witnessed out of hospital ventricular fibrillation arrest.  Resuscitated by EMS after 12 minutes and multiple shocks.  Had recurrent ventricular fibrillation on 2 separate occasions.  Total duration of CPR approximately 30 minutes. 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 Widely patent left main Widely patent circumflex Widely patent codominant right coronary Severe LV dysfunction with EF 25 to 30% with regional abnormality involving the mid anterior wall and entire apex which was dyskinetic. Cardiogenic shock requiring Levophed to maintain systolic blood pressure above 90 mmHg. Acute systolic heart failure requiring milrinone and IV Lasix to help lower filling pressures RECOMMENDATIONS: Advanced heart failure consultation and management, Dr. Glori Bickers. Critical care medicine to manage postarrest state and respiratory failure. Discussion with his girlfriend and she was fully appraised of his critically ill state.  Outcome would depend upon neurological recovery and improvement in hemodynamics and pulmonary  function. Aspirin and Brilinta per orogastric tube. Discontinue IV Aggrastat 2 hours after loading dose of Brilinta is given. Continue IV Levophed and milrinone.  ? ?DG CHEST PORT 1 VIEW ? ?  Result Date: 02/27/2022 ?CLINICAL DATA:  Chest pain. EXAM: PORTABLE CHEST 1 VIEW COMPARISON:  02/26/2022. FINDINGS: The heart size and mediastinal contours are stable. There is mild pulmonary vascular congestion bilaterally. Mild atelectasis is noted at the lung bases. No consolidation, effusion, or pneumothorax. A swans Ganz catheter is stable in position. Enteric tube courses over the left upper quadrant and out of the field of view. The endotracheal tube terminates 3 cm above the carina. No acute osseous abnormality. IMPRESSION: 1. Mildly distended pulmonary vasculature without evidence of edema. 2. Medical devices as described above. Electronically Signed   By: Brett Fairy M.D.   On: 02/27/2022 00:50  ? ?DG Chest Port 1 View ? ?Result Date: 02/26/2022 ?CLINICAL DATA:  Intubated. Cardiac arrest. EXAM: PORTABLE CHEST 1 VIEW COMPARISON:  None. FINDINGS: Endotracheal tube terminates 8 cm above the carina, the level clavicles. NG tube courses off the inferior border the film. A Swan-Ganz catheter enters from the IVC in terminates in the interlobar artery, somewhat distal. Mild edema is present. No significant airspace consolidation is present. IMPRESSION: 1. Endotracheal tube terminates 8 cm above the carina. 2. Swan-Ganz catheter terminates in the interlobar artery. 3. Mild edema. Electronically Signed   By: San Morelle M.D.   On: 02/26/2022 17:26  ? ?EEG adult ? ?Result Date: 02/26/2022 ?Lora Havens, MD     02/26/2022  7:23 PM Patient Name: DHEERAN BORTH MRN: [id number] Epilepsy Attending: Lora Havens Referring Physician/Provider: Julian Hy, DO Date:02/26/2022 Duration: 23.31 mins Patient history: 51yo m s/p cardiac arrest. EEG to evaluate for seizure Level of alertness: lethargic AEDs during EEG study: versed  Technical aspects: This EEG study was done with scalp electrodes positioned according to the 10-20 International system of electrode placement. Electrical activity was acquired at a sampling rate of 500Hz  and

## 2022-03-01 ENCOUNTER — Other Ambulatory Visit (HOSPITAL_COMMUNITY): Payer: Self-pay

## 2022-03-01 ENCOUNTER — Encounter (HOSPITAL_COMMUNITY): Payer: Self-pay | Admitting: Interventional Cardiology

## 2022-03-01 LAB — CBC
HCT: 41.8 % (ref 39.0–52.0)
Hemoglobin: 15.1 g/dL (ref 13.0–17.0)
MCH: 34.1 pg — ABNORMAL HIGH (ref 26.0–34.0)
MCHC: 36.1 g/dL — ABNORMAL HIGH (ref 30.0–36.0)
MCV: 94.4 fL (ref 80.0–100.0)
Platelets: 205 10*3/uL (ref 150–400)
RBC: 4.43 MIL/uL (ref 4.22–5.81)
RDW: 11.5 % (ref 11.5–15.5)
WBC: 14.5 10*3/uL — ABNORMAL HIGH (ref 4.0–10.5)
nRBC: 0 % (ref 0.0–0.2)

## 2022-03-01 LAB — BASIC METABOLIC PANEL
Anion gap: 6 (ref 5–15)
BUN: 11 mg/dL (ref 6–20)
CO2: 23 mmol/L (ref 22–32)
Calcium: 9.3 mg/dL (ref 8.9–10.3)
Chloride: 107 mmol/L (ref 98–111)
Creatinine, Ser: 1.57 mg/dL — ABNORMAL HIGH (ref 0.61–1.24)
GFR, Estimated: 53 mL/min — ABNORMAL LOW (ref >60)
Glucose, Bld: 89 mg/dL (ref 70–99)
Potassium: 4 mmol/L (ref 3.5–5.1)
Sodium: 136 mmol/L (ref 135–145)

## 2022-03-01 LAB — HEPATIC FUNCTION PANEL
ALT: 120 U/L — ABNORMAL HIGH (ref 0–44)
AST: 145 U/L — ABNORMAL HIGH (ref 15–41)
Albumin: 3.6 g/dL (ref 3.5–5.0)
Alkaline Phosphatase: 79 U/L (ref 38–126)
Bilirubin, Direct: 0.3 mg/dL — ABNORMAL HIGH (ref 0.0–0.2)
Indirect Bilirubin: 1.4 mg/dL — ABNORMAL HIGH (ref 0.3–0.9)
Total Bilirubin: 1.7 mg/dL — ABNORMAL HIGH (ref 0.3–1.2)
Total Protein: 6.5 g/dL (ref 6.5–8.1)

## 2022-03-01 LAB — COOXEMETRY PANEL
Carboxyhemoglobin: 2.3 % — ABNORMAL HIGH (ref 0.5–1.5)
Methemoglobin: <0.7 % (ref 0.0–1.5)
O2 Saturation: 67.7 %
Total hemoglobin: 15.2 g/dL (ref 12.0–16.0)

## 2022-03-01 LAB — MAGNESIUM: Magnesium: 2 mg/dL (ref 1.7–2.4)

## 2022-03-01 LAB — PHOSPHORUS: Phosphorus: 2.5 mg/dL (ref 2.5–4.6)

## 2022-03-01 MED ORDER — ASPIRIN 81 MG PO CHEW
81.0000 mg | CHEWABLE_TABLET | Freq: Every day | ORAL | 5 refills | Status: DC
  Filled 2022-03-01: qty 31, 31d supply, fill #0
  Filled 2022-03-31: qty 30, 30d supply, fill #0
  Filled 2022-06-03: qty 30, 30d supply, fill #1
  Filled 2022-07-15 – 2022-08-03 (×2): qty 30, 30d supply, fill #2
  Filled 2022-09-28: qty 30, 30d supply, fill #3
  Filled 2022-11-11: qty 30, 30d supply, fill #4
  Filled 2023-02-18: qty 5, 5d supply, fill #5

## 2022-03-01 MED ORDER — ATORVASTATIN CALCIUM 40 MG PO TABS
40.0000 mg | ORAL_TABLET | Freq: Every day | ORAL | 5 refills | Status: DC
  Filled 2022-03-01 – 2022-03-31 (×2): qty 30, 30d supply, fill #0
  Filled 2022-04-28: qty 30, 30d supply, fill #1
  Filled 2022-06-03: qty 30, 30d supply, fill #2
  Filled 2022-07-15 – 2022-08-03 (×2): qty 30, 30d supply, fill #3
  Filled 2022-09-28: qty 30, 30d supply, fill #4

## 2022-03-01 MED ORDER — EMPAGLIFLOZIN 10 MG PO TABS
10.0000 mg | ORAL_TABLET | Freq: Every day | ORAL | 5 refills | Status: DC
  Filled 2022-03-01 – 2022-03-31 (×2): qty 30, 30d supply, fill #0
  Filled 2022-04-28: qty 30, 30d supply, fill #1
  Filled 2022-06-03: qty 30, 30d supply, fill #2
  Filled 2022-06-18 – 2022-08-03 (×3): qty 30, 30d supply, fill #3
  Filled 2022-09-28 – 2023-01-25 (×3): qty 30, 30d supply, fill #4

## 2022-03-01 MED ORDER — CARVEDILOL 3.125 MG PO TABS
3.1250 mg | ORAL_TABLET | Freq: Two times a day (BID) | ORAL | 5 refills | Status: DC
  Filled 2022-03-01 – 2022-03-31 (×2): qty 60, 30d supply, fill #0
  Filled 2022-04-20: qty 60, 30d supply, fill #1
  Filled 2022-06-03: qty 60, 30d supply, fill #2
  Filled 2022-08-06 – 2022-08-19 (×2): qty 60, 30d supply, fill #3
  Filled 2022-09-28: qty 60, 30d supply, fill #4

## 2022-03-01 MED ORDER — SODIUM CHLORIDE 0.9 % IV SOLN
2.0000 g | INTRAVENOUS | Status: DC
  Administered 2022-03-01: 2 g via INTRAVENOUS
  Filled 2022-03-01: qty 20

## 2022-03-01 MED ORDER — TICAGRELOR 90 MG PO TABS
90.0000 mg | ORAL_TABLET | Freq: Two times a day (BID) | ORAL | 5 refills | Status: DC
  Filled 2022-03-01 – 2022-09-28 (×2): qty 60, 30d supply, fill #0

## 2022-03-01 MED ORDER — CEFDINIR 300 MG PO CAPS
300.0000 mg | ORAL_CAPSULE | Freq: Two times a day (BID) | ORAL | 0 refills | Status: DC
  Filled 2022-03-01: qty 2, 1d supply, fill #0

## 2022-03-01 MED ORDER — CARVEDILOL 3.125 MG PO TABS
3.1250 mg | ORAL_TABLET | Freq: Two times a day (BID) | ORAL | 5 refills | Status: DC
Start: 2022-03-01 — End: 2022-03-01
  Filled 2022-03-01: qty 31, 16d supply, fill #0

## 2022-03-01 MED ORDER — NITROGLYCERIN 0.4 MG SL SUBL
0.4000 mg | SUBLINGUAL_TABLET | SUBLINGUAL | 0 refills | Status: DC | PRN
  Filled 2022-03-01: qty 25, 14d supply, fill #0

## 2022-03-01 MED ORDER — SPIRONOLACTONE 25 MG PO TABS
12.5000 mg | ORAL_TABLET | Freq: Every day | ORAL | 5 refills | Status: DC
  Filled 2022-03-01 – 2022-03-31 (×2): qty 15, 30d supply, fill #0

## 2022-03-01 MED ORDER — LOSARTAN POTASSIUM 25 MG PO TABS
25.0000 mg | ORAL_TABLET | Freq: Every day | ORAL | 5 refills | Status: DC
  Filled 2022-03-01: qty 30, 30d supply, fill #0

## 2022-03-01 MED FILL — Heparin Sodium (Porcine) Inj 1000 Unit/ML: INTRAMUSCULAR | Qty: 10 | Status: AC

## 2022-03-01 MED FILL — Verapamil HCl IV Soln 2.5 MG/ML: INTRAVENOUS | Qty: 2 | Status: AC

## 2022-03-01 MED FILL — Heparin Sod (Porcine)-NaCl IV Soln 1000 Unit/500ML-0.9%: INTRAVENOUS | Qty: 500 | Status: AC

## 2022-03-01 NOTE — Progress Notes (Addendum)
HF CSW reached out to [employer] to see about screening Mr. Weightman for Medicaid as no payor source on file. [employer] reported that the patient is on the census report for [employer] to screen today.  ? ?CSW will continue to follow throughout discharge. ? ?Seraphine Gudiel, MSW, LCSW ?[phone] ?Heart Failure Social Worker  ?

## 2022-03-01 NOTE — Evaluation (Signed)
Occupational Therapy Evaluation ?Patient Details ?Name: Willie Campbell ?MRN: [id number] ?DOB: 10-22-1971 ?Today's Date: 03/01/2022 ? ? ?History of Present Illness Pt is a 51 y.o. male who presented 02/26/22 after going into cardiac arrest shortly after EMS arrived secondary to chest pain. He was initially resuscitated for 10 min, then had a recurrent arrest for another ~ 20 min. Had 2 rounds of Epi and given IV bolus of amiodarone and then drip. EKG demonstrated anterior STEMI and he underwent LHC. Pt with totally occluded LAD with EF 20-25%. LAD opened with PCI/DES 4/14. No seizures seen on EEG. CT of head negative. ETT 4/14 - 4/15. PMH: tobaccor abuse and hernia repair  ? ?Clinical Impression ?  ?Patient evaluated by Occupational Therapy with no further acute OT needs identified. All education has been completed and the patient has no further questions. See below for any follow-up Occupational Therapy or equipment needs. OT to sign off. Thank you for referral.  ?  ?   ? ?Recommendations for follow up therapy are one component of a multi-disciplinary discharge planning process, led by the attending physician.  Recommendations may be updated based on patient status, additional functional criteria and insurance authorization.  ? ?Follow Up Recommendations ? No OT follow up  ?  ?Assistance Recommended at Discharge None  ?Patient can return home with the following   ? ?  ?Functional Status Assessment ? Patient has had a recent decline in their functional status and demonstrates the ability to make significant improvements in function in a reasonable and predictable amount of time.  ?Equipment Recommendations ? None recommended by OT  ?  ?Recommendations for Other Services   ? ? ?  ?Precautions / Restrictions Precautions ?Precautions: None ?Restrictions ?Weight Bearing Restrictions: No  ? ?  ? ?Mobility Bed Mobility ?Overal bed mobility: Independent ?  ?  ?  ?  ?  ?  ?  ?  ? ?Transfers ?Overall transfer level: Independent ?  ?   ?  ?  ?  ?  ?  ?  ?  ?  ? ?  ?Balance Overall balance assessment: No apparent balance deficits (not formally assessed) ?  ?  ?  ?  ?  ?  ?  ?  ?  ?  ?  ?  ?  ?  ?  ?  ?  ?  ?   ? ?ADL either performed or assessed with clinical judgement  ? ?ADL Overall ADL's : Independent ?  ?  ?  ?  ?  ?  ?  ?  ?  ?  ?  ?  ?  ?  ?  ?  ?  ?  ?  ?General ADL Comments: completed grooming and bathing at the sink  ? ? ? ?Vision Baseline Vision/History: 0 No visual deficits ?   ?   ?Perception   ?  ?Praxis   ?  ? ?Pertinent Vitals/Pain Pain Assessment ?Pain Assessment: No/denies pain  ? ? ? ?Hand Dominance Right ?  ?Extremity/Trunk Assessment Upper Extremity Assessment ?Upper Extremity Assessment: Overall WFL for tasks assessed ?  ?Lower Extremity Assessment ?Lower Extremity Assessment: Overall WFL for tasks assessed ?  ?Cervical / Trunk Assessment ?Cervical / Trunk Assessment: Normal ?  ?Communication Communication ?Communication: No difficulties ?  ?Cognition Arousal/Alertness: Awake/alert ?Behavior During Therapy: [facility] for tasks assessed/performed ?Overall Cognitive Status: Within Functional Limits for tasks assessed ?  ?  ?  ?  ?  ?  ?  ?  ?  ?  ?  ?  ?  ?  ?  ?  ?  General Comments: completed a clock 100% correct ?  ?  ?General Comments  pt able to complete squat for sink bathing. pt encouraged to walk daily but not to run at this time. ? ?  ?Exercises   ?  ?Shoulder Instructions    ? ? ?Home Living Family/patient expects to be discharged to:: Private residence ?Living Arrangements: Non-relatives/Friends ?Available Help at Discharge: Friend(s);Available PRN/intermittently ?Type of Home: Apartment ?Home Access: Level entry ?  ?  ?Home Layout: One level ?  ?  ?Bathroom Shower/Tub: Tub/shower unit ?  ?Bathroom Toilet: Standard ?  ?  ?Home Equipment: None ?  ?Additional Comments: living with son that has TBi and cognitive changes. 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  ? ?  ?Prior Functioning/Environment Prior  Level of Function : Independent/Modified Independent ?  ?  ?  ?  ?  ?  ?  ?ADLs Comments: Tree surgeon for work. did work [place] prior to a leg injury ?  ? ?  ?  ?OT Problem List:   ?  ?   ?OT Treatment/Interventions:    ?  ?OT Goals(Current goals can be found in the care plan section) Acute Rehab OT Goals ?Patient Stated Goal: to get a job  ?OT Frequency:   ?  ? ?Co-evaluation   ?  ?  ?  ?  ? ?  ?AM-PAC OT "6 Clicks" Daily Activity     ?Outcome Measure Help from another person eating meals?: None ?Help from another person taking care of personal grooming?: None ?Help from another person toileting, which includes using toliet, bedpan, or urinal?: None ?Help from another person bathing (including washing, rinsing, drying)?: None ?Help from another person to put on and taking off regular upper body clothing?: None ?Help from another person to put on and taking off regular lower body clothing?: None ?6 Click Score: 24 ?  ?End of Session Nurse Communication: Mobility status;Precautions ? ?Activity Tolerance: Patient tolerated treatment well ?Patient left: in chair;with call bell/phone within reach ? ?OT Visit Diagnosis: Muscle weakness (generalized) (M62.81)  ?              ?Time: 9563-8756 ?OT Time Calculation (min): 38 min ?Charges:  OT General Charges ?$OT Visit: 1 Visit ?OT Evaluation ?$OT Eval Moderate Complexity: 1 Mod ?OT Treatments ?$Self Care/Home Management : 23-37 mins ? ? ?Willie Campbell, OTR/L  ?Acute Rehabilitation Services ?Pager: [phone] ?Office: [phone] ?. ? ? ?Willie Campbell ?03/01/2022, 11:47 AM ?

## 2022-03-01 NOTE — Progress Notes (Signed)
Ambulated with cardiac rehab.  ? ?

## 2022-03-01 NOTE — Progress Notes (Addendum)
? ? ?Advanced Heart Failure Rounding Note ? ? ?Subjective:   ? ?Feeling well today. Chest sore from CPR but not having any angina.  ? ?CO-OX 68%. Not on inotropes. ? ?SR 90s. No arrhythmias on telemetry. ? ? ?Objective:   ?Weight Range: ? ?Vital Signs:   ?Temp:  [98.2 ?F (36.8 ?C)-98.6 ?F (37 ?C)] 98.2 ?F (36.8 ?C) (04/17 0810) ?Pulse Rate:  [84] 84 (04/17 0814) ?Resp:  [12-16] 16 (04/17 0600) ?BP: (109-131)/(71-99) 109/89 (04/17 [id number]) ?SpO2:  [95 %-97 %] 96 % (04/17 0600) ?Weight:  [102.9 kg] 102.9 kg (04/17 0452) ?Last BM Date : 02/28/22 ? ?Weight change: ?Filed Weights  ? 02/26/22 1600 02/28/22 0500 03/01/22 0452  ?Weight: 104 kg 103.8 kg 102.9 kg  ? ? ?Intake/Output:  ? ?Intake/Output Summary (Last 24 hours) at 03/01/2022 0831 ?Last data filed at 03/01/2022 [id number] ?Gross per 24 hour  ?Intake 680.23 ml  ?Output 600 ml  ?Net 80.23 ml  ?  ? ?Physical Exam: ?General:  No distress. Sitting up in chair. ?HEENT: normal ?Neck: supple. no JVD. Carotids 2+ bilat; no bruits. ?Cor: PMI nondisplaced. Regular rate & rhythm. No rubs, gallops or murmurs. ?Lungs: clear ?Abdomen: soft, nontender, nondistended.  ?Extremities: no cyanosis, clubbing, rash, edema, right femoral groin site soft, without hematoma ?Neuro: alert & orientedx3, cranial nerves grossly intact. moves all 4 extremities w/o difficulty. Affect pleasant ? ?Telemetry: Sinus 90s ? ?Labs: ?Basic Metabolic Panel: ?Recent Labs  ?Lab 02/26/22 ?1545 02/26/22 ?1548 02/26/22 ?1556 02/26/22 ?1954 02/26/22 ?2129 02/27/22 ?0452 02/27/22 ?0617 02/27/22 ?[id number] 02/28/22 ?0403 03/01/22 ?0058  ?NA 136 131*   < >  --    < > 142 143 139 137 136  ?K 3.8 3.6   < >  --    < > 3.9 4.1 3.6 3.6 4.0  ?CL 107 96*  --   --   --   --  111 100 103 107  ?CO2 15*  --   --   --   --   --  25 27 26 23   ?GLUCOSE 359* 317*  --   --   --   --  117* 123* 92 89  ?BUN 9 9  --   --   --   --  14 12 11 11   ?CREATININE 1.61* 1.20  --   --   --   --  1.81* 1.75* 1.57* 1.57*  ?CALCIUM 7.7*  --   --   --   --    --  8.7* 8.7* 8.6* 9.3  ?MG  --   --   --  2.6*  --   --  2.2 2.0 1.9 2.0  ?PHOS  --   --   --   --   --   --  2.4*  --  3.0 2.5  ? < > = values in this interval not displayed.  ? ? ?Liver Function Tests: ?Recent Labs  ?Lab 02/26/22 ?1545 02/27/22 ?0617 02/28/22 ?0403 03/01/22 ?0058  ?AST 138* 443* 265* 145*  ?ALT 133* 207* 160* 120*  ?ALKPHOS [id number]  ?BILITOT 0.8 1.0 1.3* 1.7*  ?PROT 3.8* 5.8* 5.9* 6.5  ?ALBUMIN 2.2* 3.3* 3.3* 3.6  ? ?No results for input(s): LIPASE, AMYLASE in the last 168 hours. ?No results for input(s): AMMONIA in the last 168 hours. ? ?CBC: ?Recent Labs  ?Lab 02/26/22 ?1431 02/26/22 ?1432 02/26/22 ?1545 02/26/22 ?1548 02/27/22 ?0448 02/27/22 ?[id number] 02/27/22 ?0617 02/28/22 ?0403 03/01/22 ?[id number]  ?WBC 15.0*  --  17.3*  --   --   --  18.6* 16.1* 14.5*  ?HGB 15.6   < > 12.4*   < > 14.3 14.3 14.4 14.2 15.1  ?HCT 49.3   < > 36.0*   < > 42.0 42.0 41.1 40.5 41.8  ?MCV 107.6*  --  98.6  --   --   --  94.7 95.3 94.4  ?PLT 216  --  128*  --   --   --  246 193 205  ? < > = values in this interval not displayed.  ? ? ?Cardiac Enzymes: ?No results for input(s): CKTOTAL, CKMB, CKMBINDEX, TROPONINI in the last 168 hours. ? ?BNP: ?BNP (last 3 results) ?No results for input(s): BNP in the last 8760 hours. ? ?ProBNP (last 3 results) ?No results for input(s): PROBNP in the last 8760 hours. ? ? ? ?Other results: ? ?Imaging: ?ECHOCARDIOGRAM COMPLETE ? ?Result Date: 02/27/2022 ?   ECHOCARDIOGRAM REPORT   Patient Name:   Willie Campbell Date of Exam: 02/27/2022 Medical Rec #:  [id number]   Height:       71.0 in Accession #:    [id number]  Weight:       229.3 lb Date of Birth:  1971-06-04   BSA:          2.235 m? Patient Age:    1 years    BP:           105/72 mmHg Patient Gender: M           HR:           85 bpm. Exam Location:  Inpatient Procedure: 2D Echo, Cardiac Doppler and Color Doppler Indications:    Cardiac arrest  History:        Patient has no prior history of Echocardiogram examinations.                 Acute  MI. S/P PCI. Acute systolic heart failure. Cardiogenic                 shock.  Sonographer:    Clayton Lefort RDCS (AE) Referring Phys: [id number] Julian Hy  Sonographer Comments: Echo performed with patient supine and on artificial respirator. IMPRESSIONS  1. Left ventricular ejection fraction, by estimation, is 30 to 35%. The left ventricle has moderately decreased function. The left ventricle demonstrates regional wall motion abnormalities. The mid-to-apical anteroseptal, mid-to-apical inferoseptal, all  apical segments and apex are akinetic. The mid anterior and mid inferior LV segments are hypokinetic. Findings consistent with LAD infarct. There is mild concentric left ventricular hypertrophy. Left ventricular diastolic parameters are consistent with Grade I diastolic dysfunction (impaired relaxation). No LV trhombus visualized.  2. Right ventricular systolic function is normal. The right ventricular size is normal. There is normal pulmonary artery systolic pressure. The estimated right ventricular systolic pressure is 123456 mmHg.  3. The mitral valve is grossly normal. Trivial mitral valve regurgitation. No evidence of mitral stenosis.  4. The aortic valve is tricuspid. There is mild calcification of the aortic valve. There is mild thickening of the aortic valve. Aortic valve regurgitation is not visualized. Aortic valve sclerosis/calcification is present, without any evidence of aortic stenosis.  5. Aortic dilatation noted. There is borderline dilatation of the aortic root, measuring 37 mm. There is borderline dilatation of the ascending aorta, measuring 38 mm.  6. The inferior vena cava is normal in size with greater than 50% respiratory variability, suggesting right atrial pressure of 3 mmHg. Comparison(s): No  prior Echocardiogram. FINDINGS  Left Ventricle: Left ventricular ejection fraction, by estimation, is 30 to 35%. The left ventricle has moderately decreased function. The left ventricle demonstrates  regional wall motion abnormalities. The mid-to-apical anteroseptal, mid-to-apical inferoseptal, all apical segments and apex are akinetic. The mid anterior and mid inferior LV segments are hypokinetic. Findings consistent with LAD infarct. 3D left ventricular ejection fraction analysis performed but not reported based on interpreter judgement due to suboptimal tracking. The left ventricular internal cavity size was normal in size. There is mild concentric left ventricular hypertrophy. Left  ventricular diastolic parameters are consistent with Grade I diastolic dysfunction (impaired relaxation). Normal left ventricular filling pressure. Right Ventricle: The right ventricular size is normal. No increase in right ventricular wall thickness. Right ventricular systolic function is normal. There is normal pulmonary artery systolic pressure. The tricuspid regurgitant velocity is 1.85 m/s, and  with an assumed right atrial pressure of 3 mmHg, the estimated right ventricular systolic pressure is 123456 mmHg. Left Atrium: Left atrial size was normal in size. Right Atrium: Right atrial size was normal in size. Pericardium: There is no evidence of pericardial effusion. Mitral Valve: The mitral valve is grossly normal. There is mild thickening of the mitral valve leaflet(s). There is mild calcification of the mitral valve leaflet(s). Mild mitral annular calcification. Trivial mitral valve regurgitation. No evidence of mitral valve stenosis. Tricuspid Valve: The tricuspid valve is normal in structure. Tricuspid valve regurgitation is mild. Aortic Valve: The aortic valve is tricuspid. There is mild calcification of the aortic valve. There is mild thickening of the aortic valve. Aortic valve regurgitation is not visualized. Aortic valve sclerosis/calcification is present, without any evidence of aortic stenosis. Aortic valve mean gradient measures 3.0 mmHg. Aortic valve peak gradient measures 5.1 mmHg. Aortic valve area, by VTI  measures 2.58 cm?. Pulmonic Valve: The pulmonic valve was not well visualized. Pulmonic valve regurgitation is trivial. Aorta: Aortic dilatation noted. There is borderline dilatation of the aortic root, measurin

## 2022-03-01 NOTE — TOC Initial Note (Addendum)
Transition of Care (TOC) - Initial/Assessment Note  ? ? ?Patient Details  ?Name: Willie Campbell ?MRN: [id number] ?Date of Birth: [birth year] ? ?Transition of Care [facility]) CM/SW Contact:    ?Mariea Stable Davene Costain, RN ?Phone Number: [phone] ?03/01/2022, 11:24 AM ? ?Clinical Narrative:                 ?HF TOC CM spoke to pt at bedside. States he is living with son. States he does not work and is currently working on disability. Has an attorney handling his case. Pt states he has transportation to his appt. Arranged appt with [facility] on 03/30/2022 at 930 am to establish with PCP. Pharmacy will provide pt with scale and pill box. Explained to pt the importance of daily weights and following a heart healthy/low sodium diet. Pt meds will be covered from HF fund/MATCH. MATCH used for abx and SL ntg.  ? ?Expected Discharge Plan: Home/Self Care ?Barriers to Discharge: No Barriers Identified ? ? ?Patient Goals and CMS Choice ?Patient states their goals for this hospitalization and ongoing recovery are:: states he wants to get better ?CMS Medicare.gov Compare Post Acute Care list provided to:: Patient ?  ? ?Expected Discharge Plan and Services ?Expected Discharge Plan: Home/Self Care ?  ?Discharge Planning Services: CM Consult ?  ?Living arrangements for the past 2 months: Apartment ?                ?  ?  ?  ?  ?  ?  ?  ?  ?  ?  ? ?Prior Living Arrangements/Services ?Living arrangements for the past 2 months: Apartment ?Lives with:: Adult Children ?Patient language and need for interpreter reviewed:: Yes ?Do you feel safe going back to the place where you live?: Yes      ?Need for Family Participation in Patient Care: No (Comment) ?Care giver support system in place?: No (comment) ?  ?Criminal Activity/Legal Involvement Pertinent to Current Situation/Hospitalization: No - Comment as needed ? ?Activities of Daily Living ?Home Assistive Devices/Equipment: None ?ADL Screening (condition at time of  admission) ?Patient's cognitive ability adequate to safely complete daily activities?: Yes ?Is the patient deaf or have difficulty hearing?: No ?Does the patient have difficulty seeing, even when wearing glasses/contacts?: No ?Does the patient have difficulty concentrating, remembering, or making decisions?: No ?Patient able to express need for assistance with ADLs?: Yes ?Does the patient have difficulty dressing or bathing?: No ?Independently performs ADLs?: Yes (appropriate for developmental age) ?Does the patient have difficulty walking or climbing stairs?: No ?Weakness of Legs: None ?Weakness of Arms/Hands: None ? ?Permission Sought/Granted ?Permission sought to share information with : Case Manager, Family Supports, PCP ?Permission granted to share information with : Yes, Verbal Permission Granted ? Share Information with NAME: [place] Williams ?   ? Permission granted to share info w Relationship: sister ? Permission granted to share info w Contact Information: [phone] ? ?Emotional Assessment ?Appearance:: Appears stated age ?Attitude/Demeanor/Rapport: Engaged ?Affect (typically observed): Accepting ?Orientation: : Oriented to Self, Oriented to Place, Oriented to  Time, Oriented to Situation ?  ?Psych Involvement: No (comment) ? ?Admission diagnosis:  Cardiac arrest [place]) [I46.9] ?Patient Active Problem List  ? Diagnosis Date Noted  ? Cardiac arrest (HCC) 02/26/2022  ? STEMI (ST elevation myocardial infarction) (HCC) 02/26/2022  ? Acute systolic heart failure (HCC)   ? Cardiogenic shock (HCC)   ? ?PCP:  Patient, No Pcp Per (Inactive) ?Pharmacy:   ?[facility] [address] (  [address]), [place] - [address] ?[address] ?[place] [address] ?Phone: [phone] Fax: [phone] ? ?[facility] ?[address] ?[place] [address] ?Phone: [phone] Fax: [phone] ? ?[facility] ?[address] ?[place]  [address] ?Phone: [phone] Fax: [phone] ? ? ? ? ?Social Determinants of Health (SDOH) Interventions ?  ? ?Readmission Risk Interventions ?   ? View : No data to display.  ?  ?  ?  ? ? ? ?

## 2022-03-01 NOTE — Discharge Summary (Addendum)
Advanced Heart Failure Team ? ?Discharge Summary  ? ?Patient ID: Willie Campbell ?MRN: [id number], DOB/AGE: [birth year] 51 y.o. Admit date: 02/26/2022 ?D/C date:     03/01/2022  ? ?Primary Discharge Diagnoses:  ?VT/VF arrest ?Acute anterolateral STEMI ?Cardiogenic shock ?Acute systolic CHF ?Acute hypoxic respiratory failure ?Shock liver ?AKI ?Tobacco abuse ? ?Consults: ?CCM ? ? ?Hospital Course:  ? ?51 y.o. male with GERD and tobacco abuse. Developed CP 02/26/22. Called EMS after 2 hours of CP. In ambulance developed VT/VF. Multiple shocks in ambulance with CPR. On arrival to ER at least 2:1 block HR in 30s with ST elevation in ant lat leads.  He then developed VT and V fib and was shocked.  Had 2 rounds of Epi and given IV bolus of amiodarone and then drip. Intubated emergently and brought to cath lab.  ?  ?In cath lab had totally occluded LAD with EF 20-25%. LAD opened with PCI/DES. Started on EPI for BP support and shock team called. ?  ?[place] cath on NE 2 ?  ?Ao 119/87 (104) ?LV 157/37 ?RA 17 ?PA 45/25 (34)  ?PCWP 28 ?Fick 5.61/2.51 ?SVR 1,240 ?CPO 1.2 ?PAPi 1.2 ?  ?Initial ABG 7.05/55 ?  ?Initiated on milrinone and diuresed with IV lasix. Inotropes/pressors weaned off with stable CO-OX. Continued to progress well and was started on GDMT. No arrhythmias documented post PCI. Echo EF ~ 40%.  Extubated on 04/15. Treated abx for possible aspiration PNA vs pneumonitis. Course c/b AKI and shock liver which improved prior to discharge. ? ? ? ? ? ?He is uninsured. HF CSW/CM assisted with medicaid screen. PCP appointment arranged at [place]. May benefit from Paramedicine program if compliance an issue. ? ?Meds to [facility] - HF fund and [place] program ? ?PT OT saw, no discharge needs.  ? ?HF f/u scheduled 03/11/22, will need CMET, CBC ? ?See below for hospital course by problem. ? ? ?Hospital Course by Problem: ? ?1. Cardiac arrest (VT/VF) in setting of acute anterolateral MI ?- now s/p PCI LAD ?- extubated  02/27/22 ?- rhythm stable ?  ?2. CAD with acute anterolateral MI ?- LAD 100% occluded on cath -> PCI/DES ?- no s/s angina ?- DAPT with aspirin and ticagrelor. Given 30 day free card. Can switch to plavix if needed at f/u.  ?- Atorvastatin 40 added - will need lipid panel in 6-8 weeks ?- Continue low-dose Coreg ?- PRN SL nitroglycerin prescribed ?- Refer for CR as outpatient ?  ?3. Acute systolic HF - > cardiogenic shock ?- EF 20-25% on cath ?- Echo 4/23 EF ~40% ?- CO-OX stable off inotrope support ?- Continue carvedilol 3.125 bid ?- Continue losartan 25 daily ?- Continue spiro 12.5  ?- Continue Jardiance 10  ?- Volume stable on exam. Not requiring diuretic at discharge. Reassess volume at f/u.  ?- He was gifted a scale and pill box ?  ?4. Acute hypoxic respiratory failure ?- in setting of cardiac arrest ?- extubated 4/15 ?- stable on RA ?- completing 5 day course of abx for aspiration pneumonia vs pneumonitis. Treated with ceftriaxone during admit. Gave 2 doses of omnicef at discharge to complete treatment. ?  ?5. Hypokalemia ?- Resolved ?- K 4.0 ?  ?6. Shock liver  ?- due to arrest, improving ?- supportive care ?  ?7. AKI due to ATN/shock ?- Scr improving 1.8 -> 1.57 -> 1.57 ?  ?8. Tobacco abuse ?- Discussed smoking cessation ?- Plans to quit cold [place] ?  ? ?Discharge Weight Range:  229>226 ? ?Discharge Vitals: Blood pressure 109/89, pulse 84, temperature 97.7 ?F (36.5 ?C), temperature source Oral, resp. rate 16, height 5\' 11"  (1.803 m), weight 102.9 kg, SpO2 96 %. ? ?Labs: ?Lab Results  ?Component Value Date  ? WBC 14.5 (H) 03/01/2022  ? HGB 15.1 03/01/2022  ? HCT 41.8 03/01/2022  ? MCV 94.4 03/01/2022  ? PLT 205 03/01/2022  ?  ?Recent Labs  ?Lab 03/01/22 ?[id number]  ?NA 136  ?K 4.0  ?CL 107  ?CO2 23  ?BUN 11  ?CREATININE 1.57*  ?CALCIUM 9.3  ?PROT 6.5  ?BILITOT 1.7*  ?ALKPHOS 79  ?ALT 120*  ?AST 145*  ?GLUCOSE 89  ? ?Lab Results  ?Component Value Date  ? CHOL 121 02/26/2022  ? HDL 21 (L) 02/26/2022  ? [place] 78  02/26/2022  ? TRIG 105 02/27/2022  ? ?BNP (last 3 results) ?No results for input(s): BNP in the last 8760 hours. ? ?ProBNP (last 3 results) ?No results for input(s): PROBNP in the last 8760 hours. ? ? ?Diagnostic Studies/Procedures  ? ?Echo, 02/27/2022: ?IMPRESSIONS: ? 1. Left ventricular ejection fraction, by estimation, is 30 to 35%. The left ventricle has moderately decreased function. The left ventricle demonstrates regional wall motion abnormalities. The mid-to-apical anteroseptal, mid-to-apical inferoseptal, all apical segments and apex are akinetic. The mid anterior and mid inferior LV segments are hypokinetic. Findings consistent with LAD infarct. There is mild concentric left ventricular hypertrophy. Left ventricular diastolic parameters are consistent with Grade I diastolic dysfunction (impaired relaxation). No LV trhombus visualized.  ? 2. Right ventricular systolic function is normal. The right ventricular size is normal. There is normal pulmonary artery systolic pressure. The  ?estimated right ventricular systolic pressure is 123456 mmHg.  ? 3. The mitral valve is grossly normal. Trivial mitral valve regurgitation. No evidence of mitral stenosis.  ? 4. The aortic valve is tricuspid. There is mild calcification of the aortic valve. There is mild thickening of the aortic valve. Aortic valve  ?regurgitation is not visualized. Aortic valve sclerosis/calcification is present, without any evidence of aortic stenosis.  ? 5. Aortic dilatation noted. There is borderline dilatation of the aortic root, measuring 37 mm. There is borderline dilatation of the ascending  ?aorta, measuring 38 mm.  ? 6. The inferior vena cava is normal in size with greater than 50% respiratory variability, suggesting right atrial pressure of 3 mmHg.  ? ?R/LHC with Coronary intervention, 02/26/22 ?CONCLUSIONS: ?Anterior ST elevation MI with witnessed out of hospital ventricular fibrillation arrest.  Resuscitated by EMS after 12 minutes and  multiple shocks.  Had recurrent ventricular fibrillation on 2 separate occasions.  Total duration of CPR approximately 30 minutes. ?Total occlusion of the proximal LAD with TIMI grade III flow treated with angioplasty and stenting using a 4.0 x 15 Onyx postdilated to 5 mm in diameter with return of TIMI grade III flow. ?Widely patent left main ?Widely patent circumflex ?Widely patent codominant right coronary ?Severe LV dysfunction with EF 25 to 30% with regional abnormality involving the mid anterior wall and entire apex which was dyskinetic. ?Cardiogenic shock requiring Levophed to maintain systolic blood pressure above 90 mmHg. ?Acute systolic heart failure requiring milrinone and IV Lasix to help lower filling pressures ?  ?RECOMMENDATIONS: ?Advanced heart failure consultation and management, Dr. Glori Bickers. ?Critical care medicine to manage postarrest state and respiratory failure. ?Discussion with his girlfriend and she was fully appraised of his critically ill state.  Outcome would depend upon neurological recovery and improvement in hemodynamics and pulmonary function. ?Aspirin  and Brilinta per orogastric tube. ?Discontinue IV Aggrastat 2 hours after loading dose of Brilinta is given. ?Continue IV Levophed and milrinone. ? ?Discharge Medications  ? ?Allergies as of 03/01/2022   ?No Known Allergies ?  ? ?  ?Medication List  ?  ? ?STOP taking these medications   ? ?methocarbamol 500 MG tablet ?Commonly known as: ROBAXIN ?  ? ?  ? ?TAKE these medications   ? ?aspirin 81 MG chewable tablet ?Chew 1 tablet (81 mg total) by mouth daily. ?Start taking on: March 02, 2022 ?  ?atorvastatin 40 MG tablet ?Commonly known as: LIPITOR ?Take 1 tablet (40 mg total) by mouth daily. ?Start taking on: March 02, 2022 ?  ?carvedilol 3.125 MG tablet ?Commonly known as: COREG ?Take 1 tablet (3.125 mg total) by mouth 2 (two) times daily with a meal. ?  ?cefdinir 300 MG capsule ?Commonly known as: OMNICEF ?Take 1 capsule (300 mg  total) by mouth 2 (two) times daily. ?Start taking on: March 02, 2022 ?  ?empagliflozin 10 MG Tabs tablet ?Commonly known as: JARDIANCE ?Take 1 tablet (10 mg total) by mouth daily. ?Start taking on: March 02, 2022

## 2022-03-01 NOTE — Progress Notes (Signed)
CARDIAC REHAB PHASE I  ? ?PRE:  Rate/Rhythm: 85 SR ? ?  BP: sitting 126/94 ? ?  SaO2:  ? ?MODE:  Ambulation: 740 ft  ? ?POST:  Rate/Rhythm: 97 SR ? ?  BP: sitting 117/98  ? ?  SaO2:  ? ?Pt ambulated without difficulty. DBP elevated, no c/o. Discussed with pt MI, stent, restrictions, Brilinta, diet, smoking cessation, exercise, NTG and CRPII. Pt receptive. He sts he quit smoking when he came in. Resources given but pt is confident in quitting. He also tries to watch his diet. Has questions regarding exercise as he likes to run. Encouraged f/u with MD. Will refer to [place] CRPII however pt will probably only be able to do virtual given no insurance. He understands the importance of Brilinta. No insurance therefore will need assistance.  ?[phone] ? ?Ethelda Chick CES, ACSM ?03/01/2022 ?11:07 AM ? ? ? ? ?

## 2022-03-02 ENCOUNTER — Other Ambulatory Visit (HOSPITAL_COMMUNITY): Payer: Self-pay

## 2022-03-05 ENCOUNTER — Telehealth (HOSPITAL_COMMUNITY): Payer: Self-pay

## 2022-03-05 NOTE — Telephone Encounter (Signed)
Attempted to call patient in regards to Cardiac Rehab - unable to leave VM, phone line busy. Called 3x's ?

## 2022-03-05 NOTE — Telephone Encounter (Signed)
Pt Medicaid is pending. 

## 2022-03-11 ENCOUNTER — Encounter (HOSPITAL_COMMUNITY): Payer: Self-pay

## 2022-03-11 ENCOUNTER — Ambulatory Visit (HOSPITAL_COMMUNITY)
Admit: 2022-03-11 | Discharge: 2022-03-11 | Disposition: A | Discharge status: Home / Self Care | Payer: Self-pay | Source: Ambulatory Visit | Attending: Physician Assistant | Admitting: Physician Assistant

## 2022-03-11 ENCOUNTER — Telehealth (HOSPITAL_COMMUNITY): Payer: Self-pay | Admitting: Pharmacy Technician

## 2022-03-11 VITALS — BP 116/64 | HR 87 | Wt 221.2 lb

## 2022-03-11 DIAGNOSIS — I5022 Chronic systolic (congestive) heart failure: Secondary | ICD-10-CM | POA: Insufficient documentation

## 2022-03-11 DIAGNOSIS — I5021 Acute systolic (congestive) heart failure: Secondary | ICD-10-CM

## 2022-03-11 DIAGNOSIS — K7200 Acute and subacute hepatic failure without coma: Secondary | ICD-10-CM | POA: Insufficient documentation

## 2022-03-11 DIAGNOSIS — Z7982 Long term (current) use of aspirin: Secondary | ICD-10-CM | POA: Insufficient documentation

## 2022-03-11 DIAGNOSIS — N179 Acute kidney failure, unspecified: Secondary | ICD-10-CM | POA: Insufficient documentation

## 2022-03-11 DIAGNOSIS — Z955 Presence of coronary angioplasty implant and graft: Secondary | ICD-10-CM | POA: Insufficient documentation

## 2022-03-11 DIAGNOSIS — Z8674 Personal history of sudden cardiac arrest: Secondary | ICD-10-CM | POA: Insufficient documentation

## 2022-03-11 DIAGNOSIS — I252 Old myocardial infarction: Secondary | ICD-10-CM | POA: Insufficient documentation

## 2022-03-11 DIAGNOSIS — Z79899 Other long term (current) drug therapy: Secondary | ICD-10-CM | POA: Insufficient documentation

## 2022-03-11 DIAGNOSIS — Z7984 Long term (current) use of oral hypoglycemic drugs: Secondary | ICD-10-CM | POA: Insufficient documentation

## 2022-03-11 DIAGNOSIS — I4901 Ventricular fibrillation: Secondary | ICD-10-CM | POA: Insufficient documentation

## 2022-03-11 DIAGNOSIS — Z7901 Long term (current) use of anticoagulants: Secondary | ICD-10-CM | POA: Insufficient documentation

## 2022-03-11 DIAGNOSIS — I2582 Chronic total occlusion of coronary artery: Secondary | ICD-10-CM | POA: Insufficient documentation

## 2022-03-11 DIAGNOSIS — I2510 Atherosclerotic heart disease of native coronary artery without angina pectoris: Secondary | ICD-10-CM | POA: Insufficient documentation

## 2022-03-11 DIAGNOSIS — Z87891 Personal history of nicotine dependence: Secondary | ICD-10-CM | POA: Insufficient documentation

## 2022-03-11 LAB — COMPREHENSIVE METABOLIC PANEL
ALT: 46 U/L — ABNORMAL HIGH (ref 0–44)
AST: 26 U/L (ref 15–41)
Albumin: 3.9 g/dL (ref 3.5–5.0)
Alkaline Phosphatase: 102 U/L (ref 38–126)
Anion gap: 5 (ref 5–15)
BUN: 13 mg/dL (ref 6–20)
CO2: 29 mmol/L (ref 22–32)
Calcium: 9.8 mg/dL (ref 8.9–10.3)
Chloride: 105 mmol/L (ref 98–111)
Creatinine, Ser: 1.42 mg/dL — ABNORMAL HIGH (ref 0.61–1.24)
GFR, Estimated: >60 mL/min (ref >60)
Glucose, Bld: 101 mg/dL — ABNORMAL HIGH (ref 70–99)
Potassium: 4.5 mmol/L (ref 3.5–5.1)
Sodium: 139 mmol/L (ref 135–145)
Total Bilirubin: 1.3 mg/dL — ABNORMAL HIGH (ref 0.3–1.2)
Total Protein: 6.7 g/dL (ref 6.5–8.1)

## 2022-03-11 LAB — BRAIN NATRIURETIC PEPTIDE: B Natriuretic Peptide: 145.6 pg/mL — ABNORMAL HIGH (ref 0.0–100.0)

## 2022-03-11 MED ORDER — ENTRESTO 24-26 MG PO TABS: 1.0000 | ORAL_TABLET | Freq: Two times a day (BID) | ORAL | 6 refills | Status: DC

## 2022-03-11 NOTE — Patient Instructions (Addendum)
Thank you for coming in today ? ?Labs were done today, if any labs are abnormal the clinic will call you ?No news is good news  ? ?STOP Losartan  ? ?START Entresto 24/26 mg 1 tablet twice daily  ? ?Your physician recommends that you schedule a follow-up appointment in:  ?3 weeks with pharmacy  ?6 weeks in clinic ? ?At the [facility], you and your health needs are our priority. As part of our continuing mission to provide you with exceptional heart care, we have created designated Provider Care Teams. These Care Teams include your primary Cardiologist (physician) and Advanced Practice Providers (APPs- Physician Assistants and Nurse Practitioners) who all work together to provide you with the care you need, when you need it.  ? ?You may see any of the following providers on your designated Care Team at your next follow up: ?Dr Arvilla Meres ?Dr Marca Ancona ?Tonye Becket, NP ?Robbie Lis, PA ?Jessica Milford,NP ?Anna Genre, PA ?Karle Plumber, PharmD ? ? ?Please be sure to bring in all your medications bottles to every appointment.  ? ?If you have any questions or concerns before your next appointment please send [place] a message through [place] or call our office at [phone].   ? ?TO LEAVE A MESSAGE FOR THE NURSE SELECT OPTION 2, PLEASE LEAVE A MESSAGE INCLUDING: ?YOUR NAME ?DATE OF BIRTH ?CALL BACK NUMBER ?REASON FOR CALL**this is important as we prioritize the call backs ? ?YOU WILL RECEIVE A CALL BACK THE SAME DAY AS LONG AS YOU CALL BEFORE 4:00 PM ? ?

## 2022-03-11 NOTE — Telephone Encounter (Signed)
Advanced Heart Failure Patient Advocate Encounter ? ?Patient was seen in clinic today and cant afford Brilinta.  ? ?The patient is currently uninsured. Started an application for [place] assistance.  ? ?Will fax in once signatures are obtained. ? ?

## 2022-03-11 NOTE — Progress Notes (Signed)
CSW consulted to speak with pt about Medicaid/disability. CSW reviewed account notes and see that Dionicia Abler has been trying to reach pt to screen for Medicaid. ? ?Contacted Metallurgist who is able to meet with pt following clinic visit to assess and get required paperwork signed to assist with application if appropriate- CSW brought Metallurgist to clinic and introduced to pt ? ?Pt also being referred to paramedicine- CSW will complete assessment with pt tomorrow but he reports he is agreeable to being set up ? ?Burna Sis, LCSW ?Clinical Social Worker ?[facility] ?Desk#: [phone] ?Cell#: [phone] ? ?

## 2022-03-11 NOTE — Progress Notes (Addendum)
PCP: Establishing with CHW ?Primary Cardiologist: Dr. Gala Romney ? ?HPI: ? ?51 y.o. male with GERD and tobacco abuse. Admitted 04/23 with anterolateral STEMI. In ambulance developed VT/VF. Multiple shocks in ambulance with CPR. On arrival to ER at least 2:1 block HR in 30s with ST elevation in ant lat leads.  He then developed VT and V fib and was shocked.  Intubated emergently and brought to cath lab. He had totally occluded LAD with EF 20-25%. LAD opened with PCI/DES. Started on EPI for BP support and shock team called. ?  ?[facility] cath on NE 2 ?  ?Ao 119/87 (104) ?LV 157/37 ?RA 17 ?PA 45/25 (34)  ?PCWP 28 ?Fick 5.61/2.51 ?SVR 1,240 ?CPO 1.2 ?PAPi 1.2 ?  ?Initiated on inotrope support with milrinone and diuresed with IV lasix. Inotropes/pressors weaned off with stable CO-OX. Continued to progress well and was started on GDMT. No arrhythmias documented post PCI. Echo EF ~ 40%.  Treated abx for possible aspiration PNA vs pneumonitis. Course c/b AKI and shock liver which improved prior to discharge. ?  ?He is here today for hospital follow-up. No recurrent angina. Notes a little bit of shortness of breath with exertion but feels better than he has in a while. No orthopnea, PND or LE edema. Weight has been stable around 222lb at home. Denies dizziness or palpitations. He is taking all medications as prescribed. Has many questions about his medicines and follow-up appointments. He is unsure of the status of medicaid application. ? ? ?ROS: All systems negative except as listed in HPI, PMH and Problem List. ? ?SH:  ?Social History  ? ?Socioeconomic History  ? Marital status: Single  ?  Spouse name: Not on file  ? Number of children: Not on file  ? Years of education: Not on file  ? Highest education level: Not on file  ?Occupational History  ? Not on file  ?Tobacco Use  ? Smoking status: Former  ?  Types: Cigarettes  ?  Quit date: 02/26/2022  ?  Years since quitting: 0.0  ? Smokeless tobacco: Never  ?Substance and Sexual  Activity  ? Alcohol use: Not Currently  ?  Comment: sometimes  ? Drug use: No  ? Sexual activity: Not on file  ?Other Topics Concern  ? Not on file  ?Social History Narrative  ? Not on file  ? ?Social Determinants of Health  ? ?Financial Resource Strain: Not on file  ?Food Insecurity: Not on file  ?Transportation Needs: Not on file  ?Physical Activity: Not on file  ?Stress: Not on file  ?Social Connections: Not on file  ?Intimate Partner Violence: Not on file  ? ? ?FH: No family history on file. ? ?Past Medical History:  ?Diagnosis Date  ? Cardiac arrest (HCC) 02/26/2022  ? STEMI (ST elevation myocardial infarction) (HCC) 02/26/2022  ? ? ?Current Outpatient Medications  ?Medication Sig Dispense Refill  ? aspirin 81 MG chewable tablet Chew 1 tablet (81 mg total) by mouth daily. 31 tablet 5  ? atorvastatin (LIPITOR) 40 MG tablet Take 1 tablet (40 mg total) by mouth daily. 30 tablet 5  ? carvedilol (COREG) 3.125 MG tablet Take 1 tablet (3.125 mg total) by mouth 2 (two) times daily with a meal. 62 tablet 5  ? empagliflozin (JARDIANCE) 10 MG TABS tablet Take 1 tablet (10 mg total) by mouth daily. 30 tablet 5  ? nitroGLYCERIN (NITROSTAT) 0.4 MG SL tablet Place 1 tablet (0.4 mg total) under the tongue every 5 (five) minutes as needed for  chest pain. 25 tablet 0  ? spironolactone (ALDACTONE) 25 MG tablet Take 0.5 tablets (12.5 mg total) by mouth daily. 15 tablet 5  ? ticagrelor (BRILINTA) 90 MG TABS tablet Take 1 tablet (90 mg total) by mouth 2 (two) times daily. 60 tablet 5  ? sacubitril-valsartan (ENTRESTO) 24-26 MG Take 1 tablet by mouth 2 (two) times daily. 60 tablet 6  ? ?No current facility-administered medications for this encounter.  ? ? ?Vitals:  ? 03/11/22 1537  ?BP: 116/64  ?Pulse: 87  ?SpO2: 98%  ?Weight: 100.3 kg (221 lb 3.2 oz)  ? ? ?PHYSICAL EXAM: ? ?General:  Well appearing. No resp difficulty ?HEENT: normal ?Neck: supple. JVP flat. Carotids 2+ bilaterally; no bruits.  ?Cor: PMI normal. Regular rate & rhythm.  No rubs, gallops or murmurs. ?Lungs: clear ?Abdomen: soft, nontender, nondistended. No hepatosplenomegaly.  ?Extremities: no cyanosis, clubbing, rash, edema ?Neuro: alert & orientedx3, cranial nerves grossly intact. Moves all 4 extremities w/o difficulty. Affect pleasant. ? ? ?ECG: SR 73 bpm, anterior infarct  ? ? ?ASSESSMENT & PLAN:  ?  ?1. CAD with acute anterolateral MI ?- Cath 04/23: LAD 100% occluded -> PCI/DES ?- C/b VT/VF arrest. No recurrent ventricular arrhythmias post PCI. ?- No recurrent angina ?- DAPT with aspirin and ticagrelor. Received 30 days free. Will give him samples until patient assistance application approved and or medicaid ?- Continue Atorvastatin 40 mg daily. Lipid panel next f/u.  ?- Continue low-dose Coreg ?- Cardiac rehab has tried to contact him several times to start program. He was given phone number to call. ?  ?2. Chronic systolic HF  ?- cardiogenic shock 04/23 in setting of acute MI, EF 20-25% on cath ?- Echo 4/23 EF ~40%, multiple RWMA consistent with LAD infarct, RV okay ?- Continue carvedilol 3.125 bid ?- Switch losartan to entresto 24/26 mg BID. Start patient assistance application. ?- Continue spiro 12.5 mg dialy ?- Continue Jardiance 10 mg dialy ?- Volume stable on exam. No need for loop diuretic. ?- CMET and BNP today ?- Will need repeat echo once on maximally tolerated GDMT. ?  ?3. Recent AKI due to ATN/shock ?- Scr improving 1.8 -> 1.57 -> 1.57 at discharge ?- CMET today ? ?4. Recent shock liver ?- CMET today ?  ?5. Tobacco abuse ?- Has not smoked cigarettes since hospital discharge. He was congratulated on this. ? ?SDOH: No insurance. He is unsure about status of his medicaid application. Will engage HF CSW. Multiple questions about his meds and appointments. Poor health literacy. Refer to paramedicine. ?  ? Follow-up: 3 weeks with HF pharm D for medication titration with BMET, 6 weeks with APP ? ? ? ? ?

## 2022-03-12 NOTE — Addendum Note (Signed)
Encounter addended by: Andrey Farmer, PA-C on: 03/12/2022 6:26 PM ? Actions taken: Clinical Note Signed

## 2022-03-15 ENCOUNTER — Telehealth (HOSPITAL_COMMUNITY): Payer: Self-pay | Admitting: Licensed Clinical Social Worker

## 2022-03-15 NOTE — Telephone Encounter (Signed)
Paramedicine Initial Assessment: ? ?Housing:  ?In what kind of housing do you live? House/apt/trailer/shelter? house ? ?Do you live with anyone? Has been staying with his sister's mother in law ? ?Are you currently worried about losing your housing? Doesn't have stable housing- broke up 8-9 months ago with his girlfriend and has been staying with his sister's MIL since- want his own place but no source of income at this time   ? ? ?Social:  ?What is your current marital status? single ? ?Do you have any children? Son who has a memory condition who he checks up on ? ?Do you have family or friends who live locally? sister ? ?Food:  ?Has applied for food stamps and awaiting determination but can get enough food just has trouble affording health food ? ?Income:  ?What is your current source of income? None- stopped in November due to medical condition- family has been supporting since then. ? ?Has a lawyer helping with disability application. ? ?Insurance:  ?Are you currently insured? Medicaid pending ? ?Transportation:  ?Do you have transportation to your medical appointments? Has his own vehicle. ? ? Daily Health Needs: ?Do you have a working scale at home? yes ? ?How do you manage your medications at home? Fills his own pill box ? ?Do you ever take your medications differently than prescribed? Not recently- admits that he was once bad at keeping up with medications ? ?Do you have issues affording your medications? no ? ?If yes, has this ever prevented you from obtaining medications? no ? ?Do you have any concerns with mobility at home? no ? ?Do you use any assistive devices at home or have PCS at home? no ? ?Do you have a PCP? Appt with [facility] set for 03/30/22 ? ?Are there any additional barriers you see to getting the care you need? None reported ? ?Jorge Ny, LCSW ?Clinical Social Worker ?[facility] ?Desk#: [phone] ?Cell#: [phone] ? ? ? ? ?

## 2022-03-15 NOTE — Telephone Encounter (Signed)
CSW called pt to complete paramedicine assessment- unable to reach- left message requesting return call ? ?Jorge Ny, LCSW ?Clinical Social Worker ?[facility] ?Desk#: [phone] ?Cell#: [phone] ? ?

## 2022-03-16 ENCOUNTER — Telehealth (HOSPITAL_COMMUNITY): Payer: Self-pay

## 2022-03-17 NOTE — Telephone Encounter (Signed)
Attempted to call pt regarding new referral to paramedicine and to make appointment for home visit, no answer. I did LVM for him to return call.  ? ?Marylouise Stacks, [place]  ?[phone] ?03/16/2022 ?

## 2022-03-22 ENCOUNTER — Telehealth (HOSPITAL_COMMUNITY): Payer: Self-pay

## 2022-03-22 ENCOUNTER — Other Ambulatory Visit (HOSPITAL_COMMUNITY): Payer: Self-pay

## 2022-03-22 NOTE — Telephone Encounter (Signed)
Attempted to reach pt regarding referral to paramedicine and home visit, his phone went straight to VM- I did LVM for him to return my call.  ? ? ?Kerry Hough, EMT-Paramedic  ?[phone] ?03/22/2022 ? ?

## 2022-03-22 NOTE — Telephone Encounter (Signed)
Pharmacy Transitions of Care Follow-up Telephone Call ? ?Date of discharge: 03/01/22 ?Discharge Diagnosis: Cardiac Arrest and MI ? ?How have you been since you were released from the hospital? Patient is doing well.  ? ?Medication changes made at discharge: ?START taking: ?Aspirin Low Dose (aspirin)  ?atorvastatin (LIPITOR)  ?Brilinta (ticagrelor)  ?carvedilol (COREG)  ?Jardiance (empagliflozin)  ?nitroGLYCERIN (Nitrostat)  ?spironolactone (ALDACTONE)  ?STOP taking: ?methocarbamol 500 MG tablet (ROBAXIN)  ? ?Medication changes verified by the patient? Yes ? ?Medication Accessibility: ? ?Home Pharmacy: [facility] ? ?Was the patient provided with refills on discharged medications? Yes  ? ?Have all prescriptions been transferred from [facility] to home pharmacy? Yes  ? ?Is the patient able to afford medications? Yes ?Notable copays: $0/30 days supply ? ?Medication Review: ? ?TICAGRELOR (BRILINTA) ?Ticagrelor 90 mg BID.  ?- Educated patient on dual antiplatelet therapy with ASA 81 mg daily and ticagrelor 90 mg BID.  ?- Discussed importance of taking medication around the same time every day. ?- Reviewed potential DDIs with patient ?- Advised patient of medications to avoid (NSAIDs, aspirin maintenance doses>100 mg daily) ?- Educated that Tylenol (acetaminophen) will be the preferred analgesic to prevent risk of bleeding.  ?- Emphasized importance of monitoring for signs and symptoms of bleeding (abnormal bruising, prolonged bleeding, nose bleeds, bleeding from gums, discolored urine, black tarry stools)  ?- Educated patient to notify doctor if shortness of breath or abnormal heartbeat occur ?- Advised patient to alert all providers of antiplatelet therapy prior to starting a new medication or having a procedure  ? ?Follow-up Appointments: ? ?PCP Hospital f/u appt confirmed? Scheduled to see Karle Plumber on 03/30/22 @ 9:30 am.  ? ?If their condition worsens, is the pt aware to call PCP or go to the  Emergency Dept.? Yes ? ?Final Patient Assessment: ?Patient is doing well. Is not insured, and was unsure how to get more medication. Chart indicates that someone is working on patient assistance for [employer]. Fills at [facility]. ? ?

## 2022-03-23 ENCOUNTER — Telehealth (HOSPITAL_COMMUNITY): Payer: Self-pay | Admitting: Emergency Medicine

## 2022-03-23 NOTE — Telephone Encounter (Signed)
Spoke with Willie Campbell this morning and scheduled home visit for 03/24/22 @ 1:00.   ?

## 2022-03-23 NOTE — Telephone Encounter (Signed)
Called and spoke with patient. Sent applications for [employer] via docusign.  ?

## 2022-03-24 ENCOUNTER — Other Ambulatory Visit (HOSPITAL_COMMUNITY): Payer: Self-pay | Admitting: *Deleted

## 2022-03-24 ENCOUNTER — Telehealth (HOSPITAL_COMMUNITY): Payer: Self-pay | Admitting: Pharmacy Technician

## 2022-03-24 ENCOUNTER — Other Ambulatory Visit (HOSPITAL_COMMUNITY): Payer: Self-pay

## 2022-03-24 ENCOUNTER — Other Ambulatory Visit (HOSPITAL_COMMUNITY): Payer: Self-pay | Admitting: Emergency Medicine

## 2022-03-24 MED ORDER — ENTRESTO 24-26 MG PO TABS
1.0000 | ORAL_TABLET | Freq: Two times a day (BID) | ORAL | 6 refills | Status: DC
  Filled 2022-03-24: qty 60, 30d supply, fill #0

## 2022-03-24 NOTE — Telephone Encounter (Signed)
Advanced Heart Failure Patient Advocate Encounter ? ?Dede (EMT) called, wanted some help with the patient's medications list and if he was suppose to be on Losartan or Entresto. The patient has been taking Losartan and should be replacing that with Entresto. The patient's med list and last progress note indicate that he should be taking Entresto. I advised her that the patient would need to stop the Losartan and wait 36 hours before being able to start Entresto. Jasmine (CMA) was able [facility]. I added the free trial card to the claim. ? ?Also went over patient assistance applications I sent for both medications. We are out of Brilinta samples right now, the patient has 12 days left. I stressed the importance of the patient signing the application in the hopes that we can obtain an approval before he runs out of medication. ? ?Willie Campbell, CPhT ? ?

## 2022-03-24 NOTE — Progress Notes (Addendum)
Paramedicine Encounter ? ? ? Patient ID: Willie Campbell, male    DOB: 1971-04-06, 51 y.o.   MRN: [id number] ? ?Today was my first home visit with Willie Campbell.  He met me at the door on arrival.  Pt appears in no distress.  He states that he's just been a little more "lazy" than usual.  He denies chest pain, or SOB.  Lung sounds clear and equal bilat.  No peripheral edema noted.  Willie Campbell advises that he is currently staying with a male "friend" and says she told him he could stay as long as he needed.  Prior to this he was living in his car.  He is unemployed and has not worked since November 2022.  Medications reviewed and pill box reconciled. Pt has been compliant with all medications.   Pt has a bottle of Carvedilol which he is to discontinue and begin taking Entresto 24/26 which he currently does not have.  Spoke with Willie Campbell in triage who coordinated getting [employer] paperwork for him to fill out for  his [name] and Brilinta.  For now she was able to  arrange to get him a prescription for Entresto.   ?Discussed with pt the importance of making good food choices.  Provided him with a low sodium information for and discussed how to read labels and how to make good food choices.  During my discussion with Willie Campbell he admitted to me that he smokes "weed" occasionally and also admits using Cocaine about a week ago.  I discussed with him the obvious hazards of using illicit drugs and encouraged him to make better decisions.    I will meet with him again briefly in the morning to drop off his [employer] paperwork and Willie Campbell prescription.  He has an upcoming appointment at [facility] on 03/30/22. ? ?BP (P) 130/90 (BP Location: Right Arm, Patient Position: Sitting)   Pulse (P) 72   Resp (P) 16   Wt (P) 221 lb 12.8 oz (100.6 kg)   SpO2 (P) 98%   BMI (P) 30.93 kg/m?  ?Weight yesterday-not taken yesterday ?Last visit weight-221.8lbs ? ?Patient Care Team: ?Patient, No Pcp Per (Inactive) as PCP -  General (General Practice) ?Belva Crome, MD as PCP - Cardiology (Cardiology) ? ?Patient Active Problem List  ? Diagnosis Date Noted  ? Cardiac arrest ([place]) 02/26/2022  ? STEMI (ST elevation myocardial infarction) ([place]) 02/26/2022  ? Acute systolic heart failure ([place])   ? Cardiogenic shock ([place])   ? ? ?Current Outpatient Medications:  ?  aspirin 81 MG chewable tablet, Chew 1 tablet (81 mg total) by mouth daily., Disp: 31 tablet, Rfl: 5 ?  atorvastatin (LIPITOR) 40 MG tablet, Take 1 tablet (40 mg total) by mouth daily., Disp: 30 tablet, Rfl: 5 ?  carvedilol (COREG) 3.125 MG tablet, Take 1 tablet (3.125 mg total) by mouth 2 (two) times daily with a meal., Disp: 62 tablet, Rfl: 5 ?  empagliflozin (JARDIANCE) 10 MG TABS tablet, Take 1 tablet (10 mg total) by mouth daily., Disp: 30 tablet, Rfl: 5 ?  nitroGLYCERIN (NITROSTAT) 0.4 MG SL tablet, Place 1 tablet (0.4 mg total) under the tongue every 5 (five) minutes as needed for chest pain. (Patient taking differently: Place 0.4 mg under the tongue every 5 (five) minutes as needed for chest pain. Prescribed as needed but not taken yet,), Disp: 25 tablet, Rfl: 0 ?  spironolactone (ALDACTONE) 25 MG tablet, Take 0.5 tablets (12.5 mg total) by mouth daily.,  Disp: 15 tablet, Rfl: 5 ?  ticagrelor (BRILINTA) 90 MG TABS tablet, Take 1 tablet (90 mg total) by mouth 2 (two) times daily., Disp: 60 tablet, Rfl: 5 ?  sacubitril-valsartan (ENTRESTO) 24-26 MG, Take 1 tablet by mouth 2 (two) times daily., Disp: 60 tablet, Rfl: 6 ?No Known Allergies ? ? ? ?Social History  ? ?Socioeconomic History  ? Marital status: Single  ?  Spouse name: Not on file  ? Number of children: Not on file  ? Years of education: Not on file  ? Highest education level: Not on file  ?Occupational History  ? Not on file  ?Tobacco Use  ? Smoking status: Former  ?  Types: Cigarettes  ?  Quit date: 02/26/2022  ?  Years since quitting: 0.0  ? Smokeless tobacco: Never  ?Substance and Sexual Activity  ? Alcohol use: Not  Currently  ?  Comment: sometimes  ? Drug use: No  ? Sexual activity: Not on file  ?Other Topics Concern  ? Not on file  ?Social History Narrative  ? Not on file  ? ?Social Determinants of Health  ? ?Financial Resource Strain: High Risk  ? Difficulty of Paying Living Expenses: Hard  ?Food Insecurity: No Food Insecurity  ? Worried About Charity fundraiser in the Last Year: Never true  ? Ran Out of Food in the Last Year: Never true  ?Transportation Needs: No Transportation Needs  ? Lack of Transportation (Medical): No  ? Lack of Transportation (Non-Medical): No  ?Physical Activity: Not on file  ?Stress: Not on file  ?Social Connections: Not on file  ?Intimate Partner Violence: Not on file  ? ? ?Physical Exam ? ? ? ? ? ?Future Appointments  ?Date Time Provider [place]  ?03/30/2022  9:30 AM Ladell Pier, MD [facility] None  ?04/06/2022  1:00 PM [facility] PHARMACY [facility] None  ?04/20/2022  1:30 PM [facility] PA/NP [facility] None  ? ? ? ? ? ?[name], EMT-Paramedic ?[phone] ?[facility]  ?03/24/22 ? ?

## 2022-03-30 ENCOUNTER — Inpatient Hospital Stay: Payer: Self-pay | Admitting: Internal Medicine

## 2022-03-31 ENCOUNTER — Other Ambulatory Visit (HOSPITAL_COMMUNITY): Payer: Self-pay | Admitting: Emergency Medicine

## 2022-03-31 ENCOUNTER — Other Ambulatory Visit (HOSPITAL_COMMUNITY): Payer: Self-pay

## 2022-03-31 MED ORDER — ENTRESTO 24-26 MG PO TABS: 1.0000 | ORAL_TABLET | Freq: Two times a day (BID) | ORAL | 3 refills | Status: DC

## 2022-03-31 NOTE — Telephone Encounter (Signed)
Sent both applications via fax. POI not included with Entresto application, will initially be denied.  ?

## 2022-03-31 NOTE — Progress Notes (Signed)
Paramedicine Encounter ? ? ? Patient ID: Willie Campbell, male    DOB: 06-04-1971, 51 y.o.   MRN: [id number] ? ? ?BP 110/80 (BP Location: Left Arm, Patient Position: Sitting, Cuff Size: Normal)   Pulse 74   Resp 14   Wt 216 lb 12.8 oz (98.3 kg)   SpO2 97%   BMI 30.24 kg/m?  ?Weight yesterday-did not weigh ?Last visit weight-221 lbs  ? ?Met with Willie Campbell today.  He was very pleasant and had no complaints of chest pain or SOB.  Lung sounds clear and equal bilaterally.  No edema to his lower extremities.  Pill box reconciled with available medications.  Several refills needed from [facility] to [place].  The following needs are Atorvastatin, Carvedilol, Jardiance, Spironolactone and Aspirin. Called Pharmacy and these will be ready to pick up tomorrow.  Should be able to pick up sample bottle of Brillinta at the HF Clinic tomorrow per [facility]. ? ? ?Patient Care Team: ?Patient, No Pcp Per (Inactive) as PCP - General (General Practice) ?Belva Crome, MD as PCP - Cardiology (Cardiology) ? ?Patient Active Problem List  ? Diagnosis Date Noted  ? Cardiac arrest ([place]) 02/26/2022  ? STEMI (ST elevation myocardial infarction) ([place]) 02/26/2022  ? Acute systolic heart failure ([place])   ? Cardiogenic shock ([place])   ? ? ?Current Outpatient Medications:  ?  aspirin 81 MG chewable tablet, Chew 1 tablet (81 mg total) by mouth daily., Disp: 31 tablet, Rfl: 5 ?  atorvastatin (LIPITOR) 40 MG tablet, Take 1 tablet (40 mg total) by mouth daily., Disp: 30 tablet, Rfl: 5 ?  carvedilol (COREG) 3.125 MG tablet, Take 1 tablet (3.125 mg total) by mouth 2 (two) times daily with a meal., Disp: 62 tablet, Rfl: 5 ?  empagliflozin (JARDIANCE) 10 MG TABS tablet, Take 1 tablet (10 mg total) by mouth daily., Disp: 30 tablet, Rfl: 5 ?  nitroGLYCERIN (NITROSTAT) 0.4 MG SL tablet, Place 1 tablet (0.4 mg total) under the tongue every 5 (five) minutes as needed for chest pain. (Patient taking differently: Place 0.4 mg under the tongue every 5  (five) minutes as needed for chest pain. Prescribed as needed but not taken yet,), Disp: 25 tablet, Rfl: 0 ?  spironolactone (ALDACTONE) 25 MG tablet, Take 0.5 tablets (12.5 mg total) by mouth daily., Disp: 15 tablet, Rfl: 5 ?  ticagrelor (BRILINTA) 90 MG TABS tablet, Take 1 tablet (90 mg total) by mouth 2 (two) times daily., Disp: 60 tablet, Rfl: 5 ?  sacubitril-valsartan (ENTRESTO) 24-26 MG, Take 1 tablet by mouth 2 (two) times daily., Disp: 180 tablet, Rfl: 3 ?No Known Allergies ? ? ? ?Social History  ? ?Socioeconomic History  ? Marital status: Single  ?  Spouse name: Not on file  ? Number of children: Not on file  ? Years of education: Not on file  ? Highest education level: Not on file  ?Occupational History  ? Not on file  ?Tobacco Use  ? Smoking status: Former  ?  Types: Cigarettes  ?  Quit date: 02/26/2022  ?  Years since quitting: 0.0  ? Smokeless tobacco: Never  ?Substance and Sexual Activity  ? Alcohol use: Not Currently  ?  Comment: sometimes  ? Drug use: No  ? Sexual activity: Not on file  ?Other Topics Concern  ? Not on file  ?Social History Narrative  ? Not on file  ? ?Social Determinants of Health  ? ?Financial Resource Strain: High Risk  ? Difficulty of  Paying Living Expenses: Hard  ?Food Insecurity: No Food Insecurity  ? Worried About Charity fundraiser in the Last Year: Never true  ? Ran Out of Food in the Last Year: Never true  ?Transportation Needs: No Transportation Needs  ? Lack of Transportation (Medical): No  ? Lack of Transportation (Non-Medical): No  ?Physical Activity: Not on file  ?Stress: Not on file  ?Social Connections: Not on file  ?Intimate Partner Violence: Not on file  ? ? ?Physical Exam ? ? ? ? ? ?Future Appointments  ?Date Time Provider [place]  ?04/06/2022  1:00 PM [facility] PHARMACY [facility] None  ?04/20/2022  1:30 PM [facility] PA/NP [facility] None  ? ? ? ? ? ?[name], EMT-Paramedic ?[phone] ?[facility]  ?03/31/22  ?

## 2022-04-01 ENCOUNTER — Other Ambulatory Visit (HOSPITAL_COMMUNITY): Payer: Self-pay | Admitting: Emergency Medicine

## 2022-04-01 NOTE — Progress Notes (Signed)
Paramedicine Encounter    Patient ID: Willie Campbell, male    DOB: Mar 21, 1971, 51 y.o.   MRN: [id number]   There were no vitals taken for this visit. Weight yesterday-216 lbs Last visit weight-221 lbs  Dropped off meds (Atorvastatin, Carvedilol, Jardiance, Spironolactone and ASA) that I picked up from [facility].  Added these meds to his pill box that I didn't get to add yesterday due to him being out of these meds.  He still needs his Brillinta and I will obtain a sample bottle from HF Clinic tomorrow and drop it by in the afternoon.    Patient Care Team: Patient, No Pcp Per (Inactive) as PCP - General (General Practice) Lyn Records, MD as PCP - Cardiology (Cardiology)  Patient Active Problem List   Diagnosis Date Noted   Cardiac arrest [facility]) 02/26/2022   STEMI (ST elevation myocardial infarction) (HCC) 02/26/2022   Acute systolic heart failure (HCC)    Cardiogenic shock (HCC)     Current Outpatient Medications:    aspirin 81 MG chewable tablet, Chew 1 tablet (81 mg total) by mouth daily., Disp: 31 tablet, Rfl: 5   atorvastatin (LIPITOR) 40 MG tablet, Take 1 tablet (40 mg total) by mouth daily., Disp: 30 tablet, Rfl: 5   carvedilol (COREG) 3.125 MG tablet, Take 1 tablet (3.125 mg total) by mouth 2 (two) times daily with a meal., Disp: 62 tablet, Rfl: 5   empagliflozin (JARDIANCE) 10 MG TABS tablet, Take 1 tablet (10 mg total) by mouth daily., Disp: 30 tablet, Rfl: 5   nitroGLYCERIN (NITROSTAT) 0.4 MG SL tablet, Place 1 tablet (0.4 mg total) under the tongue every 5 (five) minutes as needed for chest pain. (Patient taking differently: Place 0.4 mg under the tongue every 5 (five) minutes as needed for chest pain. Prescribed as needed but not taken yet,), Disp: 25 tablet, Rfl: 0   sacubitril-valsartan (ENTRESTO) 24-26 MG, Take 1 tablet by mouth 2 (two) times daily., Disp: 180 tablet, Rfl: 3   spironolactone (ALDACTONE) 25 MG tablet, Take 0.5 tablets (12.5 mg total) by mouth  daily., Disp: 15 tablet, Rfl: 5   ticagrelor (BRILINTA) 90 MG TABS tablet, Take 1 tablet (90 mg total) by mouth 2 (two) times daily., Disp: 60 tablet, Rfl: 5 No Known Allergies    Social History   Socioeconomic History   Marital status: Single    Spouse name: Not on file   Number of children: Not on file   Years of education: Not on file   Highest education level: Not on file  Occupational History   Not on file  Tobacco Use   Smoking status: Former    Types: Cigarettes    Quit date: 02/26/2022    Years since quitting: 0.0   Smokeless tobacco: Never  Substance and Sexual Activity   Alcohol use: Not Currently    Comment: sometimes   Drug use: No   Sexual activity: Not on file  Other Topics Concern   Not on file  Social History Narrative   Not on file   Social Determinants of Health   Financial Resource Strain: High Risk   Difficulty of Paying Living Expenses: Hard  Food Insecurity: No Food Insecurity   Worried About Running Out of Food in the Last Year: Never true   Ran Out of Food in the Last Year: Never true  Transportation Needs: No Transportation Needs   Lack of Transportation (Medical): No   Lack of Transportation (Non-Medical): No  Physical Activity:  Not on file  Stress: Not on file  Social Connections: Not on file  Intimate Partner Violence: Not on file    Physical Exam      Future Appointments  Date Time Provider Department Center  04/06/2022  1:00 PM [facility] PHARMACY [facility] None  04/20/2022  1:30 PM [facility] PA/NP [facility] None       Beatrix Shipper, EMT-Paramedic [phone] [facility] Paramedic  04/01/22

## 2022-04-01 NOTE — Telephone Encounter (Signed)
Both applications sent and documents scanned to chart. No POI was attached. [employer] application will initially be denied.

## 2022-04-06 ENCOUNTER — Ambulatory Visit (HOSPITAL_COMMUNITY): Admission: RE | Admit: 2022-04-06 | Payer: Self-pay | Source: Ambulatory Visit

## 2022-04-07 ENCOUNTER — Other Ambulatory Visit (HOSPITAL_COMMUNITY): Payer: Self-pay | Admitting: Emergency Medicine

## 2022-04-07 NOTE — Progress Notes (Signed)
Advanced Heart Failure Clinic Note   PCP: Establishing with [facility] Primary Cardiologist: Dr. Haroldine Laws  HPI:  51 y.o. male with GERD and tobacco abuse. Admitted 02/2022 with anterolateral STEMI. In ambulance developed VT/VF. Multiple shocks in ambulance with CPR. On arrival to ER at least 2:1 block HR in 30s with ST elevation in ant lat leads.  He then developed VT and V fib and was shocked.  Intubated emergently and brought to cath lab. He had totally occluded LAD with EF 20-25%. LAD opened with PCI/DES. Started on EPI for BP support and shock team called.   RHC cath on NE 2   Ao 119/87 (104) LV 157/37 RA 17 PA 45/25 (34)  PCWP 28 Fick 5.61/2.51 SVR 1,240 CPO 1.2 PAPi 1.2   Initiated on inotrope support with milrinone and diuresed with IV Lasix. Inotropes/pressors weaned off with stable CO-OX. Continued to progress well and was started on GDMT. No arrhythmias documented post PCI. Echo EF ~ 40%.  Treated abx for possible aspiration PNA vs pneumonitis. Course c/b AKI and shock liver which improved prior to discharge.   He presented to AHF Clinic 03/11/22 for hospital follow-up. No recurrent angina. Noted a little bit of shortness of breath with exertion but reported that he felt better than he had in a while. No orthopnea, PND or LE edema. Weight had been stable around 222 lbs at home. Denied dizziness or palpitations. He reported taking all medications as prescribed. Had many questions about his medicines and follow-up appointments. He was unsure of the status of his medicaid application.    Today he returns to HF clinic for pharmacist medication titration. At last visit with APP, losartan was discontinued and Entresto 24/26 mg BID was initiated. Overall he is feeling well today. No dizziness, lightheadedness, CP or palpitations. No SOB/DOE. Weight at home has been stable at 220 lbs. Does not need a loop diuretic. No LEE, PND or orthopnea. Has HF paramedicine (Dede) to help with medications.     HF Medications: Carvedilol 3.125 mg BID Entresto 24/26 mg BID Spironolactone 12.5 mg daily Jardiance 10 mg daily  Has the patient been experiencing any side effects to the medications prescribed?  no  Does the patient have any problems obtaining medications due to transportation or finances?   No insurance. Brilinta PAP pending through [employer] - must be denied Medicaid before they will approve him. However, they will provide 3, 30 day supplies to bridge him to a Medicaid decision. Entresto PAP through [employer] pending income documentation. Instructed patient to call them today and let them know he has no income. Other HF medications [facility].   Understanding of regimen: good Understanding of indications: good Potential of compliance: fair Patient understands to avoid NSAIDs. Patient understands to avoid decongestants.    Pertinent Lab Values: 03/11/22: Serum creatinine 1.42, BUN 13, Potassium 4.5, Sodium 139 BMET today pending  Vital Signs: Weight: 219.4 lbs (last clinic weight: 221.2 lbs) Blood pressure: 114/76  Heart rate: 77   Assessment/Plan: 1. CAD with acute anterolateral MI - Cath 02/2022: LAD 100% occluded -> PCI/DES - C/b VT/VF arrest. No recurrent ventricular arrhythmias post PCI. - No recurrent angina - DAPT with aspirin and ticagrelor. Medicaid application is pending. Must be denied Medicaid for before PAP will be approved.  - Continue Atorvastatin 40 mg daily. Lipid panel next f/u.  - Continue carvedilol   2. Chronic systolic HF  - cardiogenic shock 02/2022 in setting of acute MI, EF 20-25% on cath -  Echo 02/2022 EF ~40%, multiple RWMA consistent with LAD infarct, RV okay - NYHA II. Volume stable on exam. No need for loop diuretic. - BMET today pending - Continue carvedilol 3.125 mg BID - Continue Entresto 24/26 mg BID.  - Increase spironolactone to 25 mg daily. Repeat BMET in 1 week.  - Continue Jardiance 10 mg daily - Will need repeat echo  once on maximally tolerated GDMT.   3. Recent AKI due to ATN/shock - Scr improving 1.8 -> 1.57 -> 1.57 at discharge - Scr 1.42 on last labs 03/11/22. BMET today pending   4. Recent shock liver - LFTs improved 03/11/22   5. Tobacco abuse - Has not smoked cigarettes since hospital discharge. He was congratulated on this.   SDOH: No insurance. Medicaid pending.  Multiple questions about his meds and appointments. Poor health literacy. Now helped by HF paramedicine (Dede). Appreciate their assistance.   Follow up 1 week with APP [place], PharmD, BCPS, BCCP, CPP Heart Failure Clinic Pharmacist [phone]

## 2022-04-07 NOTE — Progress Notes (Signed)
Paramedicine Encounter    Patient ID: Willie Campbell, male    DOB: 05-12-1971, 51 y.o.   MRN: [id number]  Willie Campbell presents w/o complaints of chest pain or SOB today.  No edema noted and lung sounds clear and equal bilat.  He is constantly talking about his nutrition and making good food choices.   He has been searching heart healthy recipes on the Internet.  Willie Campbell also reports that he is trying to exercise a little playing some basketball and listening to his body as to not overdo it.  I was able to get two more sample bottles of Brillinta at the clinic to refill his pill box for today.  His [employer] paperwork is still pending.  Also reviewed the proper use of his pill box to make sure he does not miss a dose of his meds.  Med rec. Completed and scheduled next visit for 5/31 @ 1:00.  BP 90/60 (BP Location: Right Arm, Patient Position: Sitting, Cuff Size: Normal)   Pulse 80   Resp 16   Wt 218 lb (98.9 kg)   SpO2 94%   BMI 30.40 kg/m  Weight yesterday-did not weigh Last visit weight-218 lbs  Patient Care Team: Patient, No Pcp Per (Inactive) as PCP - General (General Practice) Lyn Records, MD as PCP - Cardiology (Cardiology)  Patient Active Problem List   Diagnosis Date Noted   Cardiac arrest [facility]) 02/26/2022   STEMI (ST elevation myocardial infarction) (HCC) 02/26/2022   Acute systolic heart failure (HCC)    Cardiogenic shock (HCC)     Current Outpatient Medications:    aspirin 81 MG chewable tablet, Chew 1 tablet (81 mg total) by mouth daily., Disp: 31 tablet, Rfl: 5   atorvastatin (LIPITOR) 40 MG tablet, Take 1 tablet (40 mg total) by mouth daily., Disp: 30 tablet, Rfl: 5   carvedilol (COREG) 3.125 MG tablet, Take 1 tablet (3.125 mg total) by mouth 2 (two) times daily with a meal., Disp: 62 tablet, Rfl: 5   empagliflozin (JARDIANCE) 10 MG TABS tablet, Take 1 tablet (10 mg total) by mouth daily., Disp: 30 tablet, Rfl: 5   nitroGLYCERIN (NITROSTAT) 0.4 MG SL tablet, Place 1 tablet  (0.4 mg total) under the tongue every 5 (five) minutes as needed for chest pain. (Patient taking differently: Place 0.4 mg under the tongue every 5 (five) minutes as needed for chest pain. Prescribed as needed but not taken yet,), Disp: 25 tablet, Rfl: 0   sacubitril-valsartan (ENTRESTO) 24-26 MG, Take 1 tablet by mouth 2 (two) times daily., Disp: 180 tablet, Rfl: 3   spironolactone (ALDACTONE) 25 MG tablet, Take 0.5 tablets (12.5 mg total) by mouth daily., Disp: 15 tablet, Rfl: 5   ticagrelor (BRILINTA) 90 MG TABS tablet, Take 1 tablet (90 mg total) by mouth 2 (two) times daily., Disp: 60 tablet, Rfl: 5 No Known Allergies    Social History   Socioeconomic History   Marital status: Single    Spouse name: Not on file   Number of children: Not on file   Years of education: Not on file   Highest education level: Not on file  Occupational History   Not on file  Tobacco Use   Smoking status: Former    Types: Cigarettes    Quit date: 02/26/2022    Years since quitting: 0.1   Smokeless tobacco: Never  Substance and Sexual Activity   Alcohol use: Not Currently    Comment: sometimes   Drug use: No  Sexual activity: Not on file  Other Topics Concern   Not on file  Social History Narrative   Not on file   Social Determinants of Health   Financial Resource Strain: High Risk   Difficulty of Paying Living Expenses: Hard  Food Insecurity: No Food Insecurity   Worried About Running Out of Food in the Last Year: Never true   Ran Out of Food in the Last Year: Never true  Transportation Needs: No Transportation Needs   Lack of Transportation (Medical): No   Lack of Transportation (Non-Medical): No  Physical Activity: Not on file  Stress: Not on file  Social Connections: Not on file  Intimate Partner Violence: Not on file    Physical Exam      Future Appointments  Date Time Provider Department Center  04/14/2022 11:00 AM [facility] PHARMACY [facility] None  04/20/2022  1:30 PM [facility]  PA/NP [facility] None       Beatrix Shipper, EMT-Paramedic [phone] [facility] Paramedic  04/07/22

## 2022-04-07 NOTE — Telephone Encounter (Signed)
[employer] wants the patient to apply for and be denied Medicaid in order to continue processing his application. Based off of when the patient applied for Medicaid, we are hoping to hear back in August regarding a determination.  Patient would also need to fill in an income verification letter and send back to [employer] for [employer].

## 2022-04-08 ENCOUNTER — Encounter (HOSPITAL_COMMUNITY): Payer: Self-pay

## 2022-04-09 NOTE — Telephone Encounter (Signed)
Spoke to [employer]. Was able to get the patient approved for temporary status while we wait for the medicaid determination. The patient will be eligible to receive three 30 day refills during this time. The first one was placed today and should be delivered to the patient in the next 14 business days. The patient should call about 10 days ahead to ask for the remaining two refills.   Sent update to Dede (EMT).

## 2022-04-09 NOTE — Telephone Encounter (Signed)
Opened in error

## 2022-04-14 ENCOUNTER — Other Ambulatory Visit (HOSPITAL_COMMUNITY): Payer: Self-pay

## 2022-04-14 ENCOUNTER — Other Ambulatory Visit (HOSPITAL_COMMUNITY): Payer: Self-pay | Admitting: Emergency Medicine

## 2022-04-14 ENCOUNTER — Ambulatory Visit (HOSPITAL_COMMUNITY)
Admission: RE | Admit: 2022-04-14 | Discharge: 2022-04-14 | Disposition: A | Discharge status: Home / Self Care | Payer: Self-pay | Source: Ambulatory Visit | Attending: Internal Medicine | Admitting: Internal Medicine

## 2022-04-14 VITALS — BP 114/76 | HR 77 | Wt 219.4 lb

## 2022-04-14 DIAGNOSIS — K7200 Acute and subacute hepatic failure without coma: Secondary | ICD-10-CM | POA: Insufficient documentation

## 2022-04-14 DIAGNOSIS — I2510 Atherosclerotic heart disease of native coronary artery without angina pectoris: Secondary | ICD-10-CM | POA: Insufficient documentation

## 2022-04-14 DIAGNOSIS — Z720 Tobacco use: Secondary | ICD-10-CM | POA: Insufficient documentation

## 2022-04-14 DIAGNOSIS — N179 Acute kidney failure, unspecified: Secondary | ICD-10-CM | POA: Insufficient documentation

## 2022-04-14 DIAGNOSIS — K219 Gastro-esophageal reflux disease without esophagitis: Secondary | ICD-10-CM | POA: Insufficient documentation

## 2022-04-14 DIAGNOSIS — I252 Old myocardial infarction: Secondary | ICD-10-CM | POA: Insufficient documentation

## 2022-04-14 DIAGNOSIS — I5022 Chronic systolic (congestive) heart failure: Secondary | ICD-10-CM | POA: Insufficient documentation

## 2022-04-14 LAB — BASIC METABOLIC PANEL
Anion gap: 7 (ref 5–15)
BUN: 12 mg/dL (ref 6–20)
CO2: 21 mmol/L — ABNORMAL LOW (ref 22–32)
Calcium: 9.1 mg/dL (ref 8.9–10.3)
Chloride: 110 mmol/L (ref 98–111)
Creatinine, Ser: 1.18 mg/dL (ref 0.61–1.24)
GFR, Estimated: >60 mL/min (ref >60)
Glucose, Bld: 110 mg/dL — ABNORMAL HIGH (ref 70–99)
Potassium: 3.3 mmol/L — ABNORMAL LOW (ref 3.5–5.1)
Sodium: 138 mmol/L (ref 135–145)

## 2022-04-14 MED ORDER — SPIRONOLACTONE 25 MG PO TABS
25.0000 mg | ORAL_TABLET | Freq: Every day | ORAL | 5 refills | Status: DC
  Filled 2022-04-14 – 2022-04-20 (×2): qty 30, 30d supply, fill #0
  Filled 2022-06-03: qty 30, 30d supply, fill #1
  Filled 2022-07-15 – 2022-08-03 (×2): qty 30, 30d supply, fill #2
  Filled 2022-09-28: qty 30, 30d supply, fill #3
  Filled 2022-11-11: qty 30, 30d supply, fill #4
  Filled 2023-01-20: qty 30, 30d supply, fill #5

## 2022-04-14 NOTE — Patient Instructions (Addendum)
It was a pleasure seeing you today!  MEDICATIONS: -We are changing your medications today -Increase spironolactone to 25 mg (1 tablet) daily -Please call [employer] at [phone]. You will need to tell them you have no income so they can continue to process your [facility] application.  -Call if you have questions about your medications.  LABS: -We will call you if your labs need attention.  NEXT APPOINTMENT: Return to clinic in 1 week with [facility].  In general, to take care of your heart failure: -Limit your fluid intake to 2 Liters (half-gallon) per day.   -Limit your salt intake to ideally 2-3 grams (2000-3000 mg) per day. -Weigh yourself daily and record, and bring that "weight diary" to your next appointment.  (Weight gain of 2-3 pounds in 1 day typically means fluid weight.) -The medications for your heart are to help your heart and help you live longer.   -Please contact us before stopping any of your heart medications.  Call the clinic at [phone] with questions or to reschedule future appointments.

## 2022-04-14 NOTE — Progress Notes (Signed)
Paramedicine Encounter    Patient ID: Willie Campbell, male    DOB: Oct 27, 1971, 51 y.o.   MRN: [id number]  Met with Willie Campbell today he was 20 minutes late for our visit and had been at the clinic getting blood work done.  He reports to be feeling welll today.  No chest pain or SOB.  No edema to his lower extremities.  Lung sounds clear and equal bilat.  He missed 2 morning doses of his meds and 1 evening dose of meds in pill box.   Lauren reached out to me to let me know that his Spironlolactone to 66m daily.  Following up with [employer] AP[employer]oniafor his EDelene [place]ol[place] BWallis  He has enough Entresto for two weeks and only one week worth of Brillinta.   BP 100/68 (BP Location: Left Arm, Patient Position: Sitting, Cuff Size: Normal)   Pulse 63   Wt 217 lb 12.8 oz (98.8 kg)   SpO2 91%   BMI 30.38 kg/m  Weight yesterday-not taking Last visit weight-218  Patient Care Team: Patient, No Pcp Per (Inactive) as PCP - General ([place] SBelva Crome MD as PCP - Cardiology (Cardiology)  Patient Active Problem List   Diagnosis Date Noted   Cardiac arrest ([facility] 02/26/2022   STEMI (ST elevation myocardial infarction) ([place] 017/51/0258  Acute systolic heart failure ([place]    Cardiogenic shock (HCC)     Current Outpatient Medications:    aspirin 81 MG chewable tablet, Chew 1 tablet (81 mg total) by mouth daily., Disp: 31 tablet, Rfl: 5   atorvastatin (LIPITOR) 40 MG tablet, Take 1 tablet (40 mg total) by mouth daily., Disp: 30 tablet, Rfl: 5   carvedilol (COREG) 3.125 MG tablet, Take 1 tablet (3.125 mg total) by mouth 2 (two) times daily with a meal., Disp: 62 tablet, Rfl: 5   empagliflozin (JARDIANCE) 10 MG TABS tablet, Take 1 tablet (10 mg total) by mouth daily., Disp: 30 tablet, Rfl: 5   nitroGLYCERIN (NITROSTAT) 0.4 MG SL tablet, Place 1 tablet (0.4 mg total) under the tongue every 5 (five) minutes as needed for chest pain., Disp: 25 tablet, Rfl: 0   sacubitril-valsartan (ENTRESTO) 24-26  MG, Take 1 tablet by mouth 2 (two) times daily., Disp: 180 tablet, Rfl: 3   spironolactone (ALDACTONE) 25 MG tablet, Take 1 tablet (25 mg total) by mouth daily., Disp: 30 tablet, Rfl: 5   ticagrelor (BRILINTA) 90 MG TABS tablet, Take 1 tablet (90 mg total) by mouth 2 (two) times daily., Disp: 60 tablet, Rfl: 5 No Known Allergies    Social History   Socioeconomic History   Marital status: Single    Spouse name: Not on file   Number of children: Not on file   Years of education: Not on file   Highest education level: Not on file  Occupational History   Not on file  Tobacco Use   Smoking status: Former    Types: Cigarettes    Quit date: 02/26/2022    Years since quitting: 0.1   Smokeless tobacco: Never  Substance and Sexual Activity   Alcohol use: Not Currently    Comment: sometimes   Drug use: No   Sexual activity: Not on file  Other Topics Concern   Not on file  Social History Narrative   Not on file   Social Determinants of Health   Financial Resource Strain: High Risk   Difficulty of Paying Living Expenses: Hard  Food Insecurity: No Food  Insecurity   Worried About Charity fundraiser in the Last Year: Never true   [place] in the Last Year: Never true  Transportation Needs: No Transportation Needs   Lack of Transportation (Medical): No   Lack of Transportation (Non-Medical): No  Physical Activity: Not on file  Stress: Not on file  Social Connections: Not on file  Intimate Partner Violence: Not on file    Physical Exam      Future Appointments  Date Time Provider [place]  04/20/2022  1:30 PM [place], [place] Paramedic  04/14/22

## 2022-04-19 NOTE — Progress Notes (Signed)
Advanced Heart Failure Clinic Note   PCP: Establishing with [facility] HF Cardiologist: Dr. Gala Romney  HPI: Willie Campbell is a 51 y.o.male with GERD, tobacco abuse, and newly diagnosed systolic heart failure and CAD.  Admitted 04/23 with anterolateral STEMI. In ambulance developed VT/VF. Multiple shocks in ambulance with CPR. On arrival to ER at least 2:1 block HR in 30s with ST elevation in ant lat leads.  He then developed VT and V fib and was shocked.  Intubated emergently and brought to cath lab. R/LHC showed totally occluded LAD with EF 20-25%, s/p PCI/DES to LAD. Started on EPI and shock team called. RHC on NE 2 showed elevated right filling and PA pressures, CO/CI (Fick) 5.61/2.51 OK on NE support. Started on milrinone and diuresed with IV lasix. Drips eventually weaned off with stable CO-OX. GDMT started. No arrhythmias post PCI. Repeat echo EF ~ 40%.  Treated abx for possible aspiration PNA vs pneumonitis. Course c/b AKI and shock liver which improved prior to discharge.   Post hospital follow up 4/23, stable NYHA II symptoms, losartan switched to Entresto.  Today he returns for HF follow up with paramedic, DeDe. Overall feeling fine. He has mild SOB running or pushing himself. Has tried playing basketball to stay physically active. Denies palpitations, CP, dizziness, edema, or PND/Orthopnea. Appetite ok. No fever or chills. Weight at home 216 pounds. Taking all medications. Back to smoking 2 cigs/day, occasional ETOH use.  Cardiac Studies: - Echo, post PCI (4/23): EF 40%  - R/LHC (4/23): on NE 2, PCI to LAD Ao 119/87 (104) LV 157/37 RA 17 PA 45/25 (34)  PCWP 28 Fick 5.61/2.51 SVR 1,240 CPO 1.2 PAPi 1.2  - Echo (4/23): EF 20-25%   ROS: All systems negative except as listed in HPI, PMH and Problem List.  SH:  Social History   Socioeconomic History   Marital status: Single    Spouse name: Not on file   Number of children: Not on file   Years of education: Not on file    Highest education level: Not on file  Occupational History   Not on file  Tobacco Use   Smoking status: Former    Types: Cigarettes    Quit date: 02/26/2022    Years since quitting: 0.1   Smokeless tobacco: Never  Substance and Sexual Activity   Alcohol use: Not Currently    Comment: sometimes   Drug use: No   Sexual activity: Not on file  Other Topics Concern   Not on file  Social History Narrative   Not on file   Social Determinants of Health   Financial Resource Strain: High Risk   Difficulty of Paying Living Expenses: Hard  Food Insecurity: No Food Insecurity   Worried About Running Out of Food in the Last Year: Never true   Ran Out of Food in the Last Year: Never true  Transportation Needs: No Transportation Needs   Lack of Transportation (Medical): No   Lack of Transportation (Non-Medical): No  Physical Activity: Not on file  Stress: Not on file  Social Connections: Not on file  Intimate Partner Violence: Not on file   FH: No family history on file.  Past Medical History:  Diagnosis Date   Cardiac arrest (HCC) 02/26/2022   STEMI (ST elevation myocardial infarction) (HCC) 02/26/2022   Current Outpatient Medications  Medication Sig Dispense Refill   aspirin 81 MG chewable tablet Chew 1 tablet (81 mg total) by mouth daily. 31 tablet 5   atorvastatin (LIPITOR)  40 MG tablet Take 1 tablet (40 mg total) by mouth daily. 30 tablet 5   carvedilol (COREG) 3.125 MG tablet Take 1 tablet (3.125 mg total) by mouth 2 (two) times daily with a meal. 62 tablet 5   empagliflozin (JARDIANCE) 10 MG TABS tablet Take 1 tablet (10 mg total) by mouth daily. 30 tablet 5   nitroGLYCERIN (NITROSTAT) 0.4 MG SL tablet Place 1 tablet (0.4 mg total) under the tongue every 5 (five) minutes as needed for chest pain. 25 tablet 0   sacubitril-valsartan (ENTRESTO) 24-26 MG Take 1 tablet by mouth 2 (two) times daily. 180 tablet 3   spironolactone (ALDACTONE) 25 MG tablet Take 1 tablet (25 mg total) by  mouth daily. 30 tablet 5   ticagrelor (BRILINTA) 90 MG TABS tablet Take 1 tablet (90 mg total) by mouth 2 (two) times daily. 60 tablet 5   No current facility-administered medications for this encounter.   BP 102/60   Pulse 82   Wt 97.9 kg (215 lb 12.8 oz)   SpO2 97%   BMI 30.10 kg/m   Wt Readings from Last 3 Encounters:  04/20/22 97.9 kg (215 lb 12.8 oz)  04/20/22 97.9 kg (215 lb 12.8 oz)  04/14/22 99.5 kg (219 lb 6.4 oz)   PHYSICAL EXAM: General:  NAD. No resp difficulty HEENT: Normal Neck: Supple. No JVD. Carotids 2+ bilat; no bruits. No lymphadenopathy or thryomegaly appreciated. Cor: PMI nondisplaced. Regular rate & rhythm. No rubs, gallops or murmurs. Lungs: Clear Abdomen: Soft, nontender, nondistended. No hepatosplenomegaly. No bruits or masses. Good bowel sounds. Extremities: No cyanosis, clubbing, rash, edema Neuro: Alert & oriented x 3, cranial nerves grossly intact. Moves all 4 extremities w/o difficulty. Affect pleasant.  ASSESSMENT & PLAN:   1. CAD  - Cath 4/23: LAD 100% occluded -> PCI/DES, c/b VT/VF arrest. - DAPT with aspirin and ticagrelor. Received 30 days free.  - No chest pain. - Continue atorvastatin 40 mg daily. - Continue beta blocker. - Waiting to start CR. - Lipids today.  2. Chronic systolic HF  - cardiogenic shock 04/23 in setting of acute MI. - Echo (4/23): EF ~40%, multiple RWMA consistent with LAD infarct, RV okay. - NYHA II, volume ok today. He does not need daily loops. - No BP room to increase GDMT today. - Continue carvedilol 3.125 mg bid - Continue Entresto 24/26 mg bid. - Continue spiro 25 mg daily. - Continue Jardiance 10 mg daily - Repeat echo next appt. - Labs today.   3. H/o AKI - Resolved. - Check labs today.  4. H/o shock liver - Resolved.    5. Tobacco abuse - Previously quit but now back smoking 2 cigs/day. Discussed patches/gum. - Discussed complete cessation.  6. SDOH:  - No insurance. He is unsure about  status of his medicaid application. [employer]FSW helping. - Poor health literacy. Continue paramedicine, appreciate their assistance.   Follow up in 2-3 months with Dr. Gala Romney + echo.  Prince Rome, FNP-BC 04/20/22

## 2022-04-20 ENCOUNTER — Other Ambulatory Visit (HOSPITAL_COMMUNITY): Payer: Self-pay | Admitting: Physician Assistant

## 2022-04-20 ENCOUNTER — Other Ambulatory Visit (HOSPITAL_COMMUNITY): Payer: Self-pay

## 2022-04-20 ENCOUNTER — Ambulatory Visit (HOSPITAL_COMMUNITY)
Admission: RE | Admit: 2022-04-20 | Discharge: 2022-04-20 | Disposition: A | Discharge status: Home / Self Care | Payer: Self-pay | Source: Ambulatory Visit | Attending: Cardiology | Admitting: Cardiology

## 2022-04-20 ENCOUNTER — Other Ambulatory Visit (HOSPITAL_COMMUNITY): Payer: Self-pay | Admitting: Emergency Medicine

## 2022-04-20 ENCOUNTER — Encounter (HOSPITAL_COMMUNITY): Payer: Self-pay

## 2022-04-20 VITALS — BP 102/60 | HR 82 | Wt 215.8 lb

## 2022-04-20 DIAGNOSIS — Z79899 Other long term (current) drug therapy: Secondary | ICD-10-CM | POA: Insufficient documentation

## 2022-04-20 DIAGNOSIS — Z7984 Long term (current) use of oral hypoglycemic drugs: Secondary | ICD-10-CM | POA: Insufficient documentation

## 2022-04-20 DIAGNOSIS — I5022 Chronic systolic (congestive) heart failure: Secondary | ICD-10-CM | POA: Insufficient documentation

## 2022-04-20 DIAGNOSIS — Z556 Problems related to health literacy: Secondary | ICD-10-CM | POA: Insufficient documentation

## 2022-04-20 DIAGNOSIS — I252 Old myocardial infarction: Secondary | ICD-10-CM | POA: Insufficient documentation

## 2022-04-20 DIAGNOSIS — Z8674 Personal history of sudden cardiac arrest: Secondary | ICD-10-CM | POA: Insufficient documentation

## 2022-04-20 DIAGNOSIS — Z139 Encounter for screening, unspecified: Secondary | ICD-10-CM

## 2022-04-20 DIAGNOSIS — Z955 Presence of coronary angioplasty implant and graft: Secondary | ICD-10-CM | POA: Insufficient documentation

## 2022-04-20 DIAGNOSIS — K219 Gastro-esophageal reflux disease without esophagitis: Secondary | ICD-10-CM | POA: Insufficient documentation

## 2022-04-20 DIAGNOSIS — I2510 Atherosclerotic heart disease of native coronary artery without angina pectoris: Secondary | ICD-10-CM | POA: Insufficient documentation

## 2022-04-20 DIAGNOSIS — F17210 Nicotine dependence, cigarettes, uncomplicated: Secondary | ICD-10-CM | POA: Insufficient documentation

## 2022-04-20 LAB — BASIC METABOLIC PANEL
Anion gap: 6 (ref 5–15)
BUN: 10 mg/dL (ref 6–20)
CO2: 23 mmol/L (ref 22–32)
Calcium: 9 mg/dL (ref 8.9–10.3)
Chloride: 108 mmol/L (ref 98–111)
Creatinine, Ser: 1.16 mg/dL (ref 0.61–1.24)
GFR, Estimated: >60 mL/min (ref >60)
Glucose, Bld: 107 mg/dL — ABNORMAL HIGH (ref 70–99)
Potassium: 3.8 mmol/L (ref 3.5–5.1)
Sodium: 137 mmol/L (ref 135–145)

## 2022-04-20 LAB — LIPID PANEL
Cholesterol: 128 mg/dL (ref 0–200)
HDL: 41 mg/dL (ref >40)
LDL Cholesterol: 63 mg/dL (ref 0–99)
Total CHOL/HDL Ratio: 3.1 RATIO
Triglycerides: 121 mg/dL (ref <150)
VLDL: 24 mg/dL (ref 0–40)

## 2022-04-20 NOTE — Patient Instructions (Addendum)
Thank you for coming in today  Labs were done today, if any labs are abnormal the clinic will call you No news is good news  Your physician recommends that you schedule a follow-up appointment in:  2-3 months with Dr. Gala Romney with echocardiogram  Your physician has requested that you have an echocardiogram. Echocardiography is a painless test that uses sound waves to create images of your heart. It provides your doctor with information about the size and shape of your heart and how well your heart's chambers and valves are working. This procedure takes approximately one hour. There are no restrictions for this procedure.   At the Advanced [facility] Clinic, you and your health needs are our priority. As part of our continuing mission to provide you with exceptional heart care, we have created designated Provider Care Teams. These Care Teams include your primary Cardiologist (physician) and Advanced Practice Providers (APPs- Physician Assistants and Nurse Practitioners) who all work together to provide you with the care you need, when you need it.   You may see any of the following providers on your designated Care Team at your next follow up: Dr Arvilla Meres Dr Carron Curie, NP Robbie Lis, [place] [facility] [place], [place] Karle Plumber, PharmD   Please be sure to bring in all your medications bottles to every appointment.   If you have any questions or concerns before your next appointment please send [place] a message through [place] or call our office at [phone].    TO LEAVE A MESSAGE FOR THE NURSE SELECT OPTION 2, PLEASE LEAVE A MESSAGE INCLUDING: YOUR NAME DATE OF BIRTH CALL BACK NUMBER REASON FOR CALL**this is important as we prioritize the call backs  YOU WILL RECEIVE A CALL BACK THE SAME DAY AS LONG AS YOU CALL BEFORE 4:00 PM

## 2022-04-20 NOTE — Progress Notes (Signed)
Paramedicine Encounter   Patient ID: Willie Campbell , male,   DOB: Mar 28, 1971,50 y.o.,  MRN: [id number]   Met patient in clinic today with provider.  Time spent with patient 45 minutes  Met pt with [name] today in clinic.  Pt. Without complaints and has been compliant with his medications.  [name] discussed getting Mr. Cordaro  into a Cardiac Rehab and currently they are waiting for his Medicaid to kick in.  Pt denies chest pain or SOB.  No edema to his lower extremities.  Lung sounds clear throughout.  Med box reconciled.  Refill for his Spironolactone and Carvedilol called in to [facility].   No medication changes at this time per [facility].    [name], [place] 04/20/2022

## 2022-04-21 ENCOUNTER — Encounter (HOSPITAL_COMMUNITY): Payer: Self-pay | Admitting: Emergency Medicine

## 2022-04-21 NOTE — Progress Notes (Signed)
Medication Samples have been provided to the patient.  Drug name: Sherryll Burger       Strength: 24/26 mg        Qty: 1  LOT: [id number]  Exp.Date: 12/2023  Dosing instructions: Take 1 tablet Twice daily   The patient has been instructed regarding the correct time, dose, and frequency of taking this medication, including desired effects and most common side effects.   Smitty Cords Julliette Frentz 4:10 PM 04/21/2022

## 2022-04-21 NOTE — Progress Notes (Unsigned)
Picked up sample bottle of Entresto 24/26 from HF Clinic.  Willie Campbell still needs Attestation form from [employer] to get assistance with this medication

## 2022-04-28 ENCOUNTER — Other Ambulatory Visit (HOSPITAL_COMMUNITY): Payer: Self-pay

## 2022-04-28 ENCOUNTER — Other Ambulatory Visit (HOSPITAL_COMMUNITY): Payer: Self-pay | Admitting: Emergency Medicine

## 2022-04-28 ENCOUNTER — Other Ambulatory Visit (HOSPITAL_COMMUNITY): Payer: Self-pay | Admitting: *Deleted

## 2022-04-28 MED ORDER — ENTRESTO 24-26 MG PO TABS
1.0000 | ORAL_TABLET | Freq: Two times a day (BID) | ORAL | 3 refills | Status: DC
  Filled 2022-04-28 – 2023-01-25 (×5): qty 60, 30d supply, fill #0
  Filled 2023-03-26 – 2023-04-06 (×2): qty 60, 30d supply, fill #1

## 2022-04-28 NOTE — Progress Notes (Signed)
Paramedicine Encounter    Patient ID: Willie Campbell, male    DOB: [birth year], 51 y.o.   MRN: [id number]   BP 100/70 (BP Location: Left Arm, Patient Position: Sitting, Cuff Size: Normal)   Pulse 86   Resp 16   Wt 214 lb 6.4 oz (97.3 kg)   SpO2 96%   BMI 29.90 kg/m  Weight yesterday-did not weigh Last visit weight-215lb  ATF Willie Campbell, CA&O x 4, skin warm and dry with good color.  He has no complaints of chest pain or SOB.  Lung sounds clear and equal in all fields.  No peripheral edema noted and no obvious JVD.  He only missed 1 dose of his medications since my last visit.  Refills called into [facility].  Pt was completely out of his Spironolactone and only had enough Entresto to last till Monday morning.   Pt. Advised he call [employer] on Thursday of last week requesting an Research scientist (medical) and they are supposed to be sending him one in the mail.  Contacted [employer] during this visit with pt to see if the [employer] letter had been mailed out yet.  They advised they had no record of same.  I was able to stay on the phone with Willie Campbell and [employer] and provided the appropriate information.  [employer] advised he would receive the letter in the mail 7-10 business days from today.    Patient Care Team: Patient, No Pcp Per (Inactive) as PCP - General (General Practice) Lyn Records, MD as PCP - Cardiology (Cardiology)  Patient Active Problem List   Diagnosis Date Noted   Cardiac arrest [facility]) 02/26/2022   STEMI (ST elevation myocardial infarction) (HCC) 02/26/2022   Acute systolic heart failure (HCC)    Cardiogenic shock (HCC)     Current Outpatient Medications:    aspirin 81 MG chewable tablet, Chew 1 tablet (81 mg total) by mouth daily., Disp: 31 tablet, Rfl: 5   atorvastatin (LIPITOR) 40 MG tablet, Take 1 tablet (40 mg total) by mouth daily., Disp: 30 tablet, Rfl: 5   carvedilol (COREG) 3.125 MG tablet, Take 1 tablet (3.125 mg total) by mouth 2 (two) times daily with a  meal., Disp: 62 tablet, Rfl: 5   empagliflozin (JARDIANCE) 10 MG TABS tablet, Take 1 tablet (10 mg total) by mouth daily., Disp: 30 tablet, Rfl: 5   nitroGLYCERIN (NITROSTAT) 0.4 MG SL tablet, Place 1 tablet (0.4 mg total) under the tongue every 5 (five) minutes as needed for chest pain., Disp: 25 tablet, Rfl: 0   sacubitril-valsartan (ENTRESTO) 24-26 MG, Take 1 tablet by mouth 2 (two) times daily., Disp: 180 tablet, Rfl: 3   spironolactone (ALDACTONE) 25 MG tablet, Take 1 tablet (25 mg total) by mouth daily., Disp: 30 tablet, Rfl: 5   ticagrelor (BRILINTA) 90 MG TABS tablet, Take 1 tablet (90 mg total) by mouth 2 (two) times daily., Disp: 60 tablet, Rfl: 5 No Known Allergies    Social History   Socioeconomic History   Marital status: Single    Spouse name: Not on file   Number of children: Not on file   Years of education: Not on file   Highest education level: Not on file  Occupational History   Not on file  Tobacco Use   Smoking status: Former    Types: Cigarettes    Quit date: 02/26/2022    Years since quitting: 0.1   Smokeless tobacco: Never  Substance and Sexual Activity   Alcohol use:  Not Currently    Comment: sometimes   Drug use: No   Sexual activity: Not on file  Other Topics Concern   Not on file  Social History Narrative   Not on file   Social Determinants of Health   Financial Resource Strain: High Risk (03/15/2022)   Overall Financial Resource Strain (CARDIA)    Difficulty of Paying Living Expenses: Hard  Food Insecurity: No Food Insecurity (03/15/2022)   Hunger Vital Sign    Worried About Running Out of Food in the Last Year: Never true    Ran Out of Food in the Last Year: Never true  Transportation Needs: No Transportation Needs (03/15/2022)   PRAPARE - Administrator, Civil Service (Medical): No    Lack of Transportation (Non-Medical): No  Physical Activity: Not on file  Stress: Not on file  Social Connections: Not on file  Intimate Partner  Violence: Not on file    Physical Exam      Future Appointments  Date Time Provider Department Center  08/05/2022  1:00 PM [facility] ECHO OP 1 [facility]  08/05/2022  2:00 PM Bensimhon, Bevelyn Buckles, MD [facility] None       Beatrix Shipper, EMT-Paramedic [phone] [facility] Paramedic  04/28/22

## 2022-04-28 NOTE — Progress Notes (Signed)
Met pt. @ clinic parking lot to deliver a 1 sample bottle of Entresto 24/26 to complete his med box.

## 2022-04-29 ENCOUNTER — Other Ambulatory Visit: Payer: Self-pay

## 2022-04-29 ENCOUNTER — Other Ambulatory Visit (HOSPITAL_COMMUNITY): Payer: Self-pay

## 2022-04-30 ENCOUNTER — Telehealth (HOSPITAL_COMMUNITY): Payer: Self-pay

## 2022-04-30 NOTE — Telephone Encounter (Signed)
No response from pt in regards to cardiac rehab. Closed referral 

## 2022-05-05 ENCOUNTER — Other Ambulatory Visit (HOSPITAL_COMMUNITY): Payer: Self-pay | Admitting: Emergency Medicine

## 2022-05-05 NOTE — Progress Notes (Signed)
Paramedicine Encounter    Patient ID: Willie Campbell, male    DOB: 1971-06-29, 51 y.o.   MRN: [id number]   BP 110/78 (BP Location: Left Arm, Patient Position: Sitting, Cuff Size: Normal)   Pulse 85   Wt 217 lb 9.6 oz (98.7 kg)   SpO2 95%   BMI 30.35 kg/m  Weight yesterday-no weight Last visit weight-214.6  ATF Mr. Janoski CA&O x 4, skin warm and dry with good color.  Pt was doing housework on my arrival and states he feels good today.  No chest pain or SOB.  Lung sounds clear and equal bilat.  No peripheral edema noted.  Med box reconciled.  He is still waiting on his letter from [employer] for his [place] assistance.  Patient Care Team: Patient, No Pcp Per as PCP - General (General Practice) Lyn Records, MD as PCP - Cardiology (Cardiology)  Patient Active Problem List   Diagnosis Date Noted   Cardiac arrest [facility]) 02/26/2022   STEMI (ST elevation myocardial infarction) (HCC) 02/26/2022   Acute systolic heart failure (HCC)    Cardiogenic shock (HCC)     Current Outpatient Medications:    aspirin 81 MG chewable tablet, Chew 1 tablet (81 mg total) by mouth daily., Disp: 31 tablet, Rfl: 5   atorvastatin (LIPITOR) 40 MG tablet, Take 1 tablet (40 mg total) by mouth daily., Disp: 30 tablet, Rfl: 5   carvedilol (COREG) 3.125 MG tablet, Take 1 tablet (3.125 mg total) by mouth 2 (two) times daily with a meal., Disp: 62 tablet, Rfl: 5   empagliflozin (JARDIANCE) 10 MG TABS tablet, Take 1 tablet (10 mg total) by mouth daily., Disp: 30 tablet, Rfl: 5   nitroGLYCERIN (NITROSTAT) 0.4 MG SL tablet, Place 1 tablet (0.4 mg total) under the tongue every 5 (five) minutes as needed for chest pain., Disp: 25 tablet, Rfl: 0   sacubitril-valsartan (ENTRESTO) 24-26 MG, Take 1 tablet by mouth 2 (two) times daily., Disp: 60 tablet, Rfl: 3   spironolactone (ALDACTONE) 25 MG tablet, Take 1 tablet (25 mg total) by mouth daily., Disp: 30 tablet, Rfl: 5   ticagrelor (BRILINTA) 90 MG TABS tablet, Take 1 tablet (90 mg  total) by mouth 2 (two) times daily., Disp: 60 tablet, Rfl: 5 No Known Allergies    Social History   Socioeconomic History   Marital status: Single    Spouse name: Not on file   Number of children: Not on file   Years of education: Not on file   Highest education level: Not on file  Occupational History   Not on file  Tobacco Use   Smoking status: Former    Types: Cigarettes    Quit date: 02/26/2022    Years since quitting: 0.1   Smokeless tobacco: Never  Substance and Sexual Activity   Alcohol use: Not Currently    Comment: sometimes   Drug use: No   Sexual activity: Not on file  Other Topics Concern   Not on file  Social History Narrative   Not on file   Social Determinants of Health   Financial Resource Strain: High Risk (03/15/2022)   Overall Financial Resource Strain (CARDIA)    Difficulty of Paying Living Expenses: Hard  Food Insecurity: No Food Insecurity (03/15/2022)   Hunger Vital Sign    Worried About Running Out of Food in the Last Year: Never true    Ran Out of Food in the Last Year: Never true  Transportation Needs: No Transportation Needs (03/15/2022)  PRAPARE - Administrator, Civil Service (Medical): No    Lack of Transportation (Non-Medical): No  Physical Activity: Not on file  Stress: Not on file  Social Connections: Not on file  Intimate Partner Violence: Not on file    Physical Exam      Future Appointments  Date Time Provider Department Center  08/05/2022  1:00 PM [facility] ECHO OP 1 [facility]  08/05/2022  2:00 PM Bensimhon, Bevelyn Buckles, MD MC-HV[facility] None       Beatrix Shipper, EMT-Paramedic [phone] [facility] Paramedic  05/05/22

## 2022-05-12 ENCOUNTER — Other Ambulatory Visit (HOSPITAL_COMMUNITY): Payer: Self-pay | Admitting: Emergency Medicine

## 2022-05-12 NOTE — Progress Notes (Signed)
Paramedicine Encounter    Patient ID: Willie Campbell, male    DOB: 1971/10/20, 51 y.o.   MRN: [id number]   BP 110/80 (BP Location: Right Arm, Patient Position: Sitting, Cuff Size: Normal)   Pulse 70   Resp 16   Wt 216 lb (98 kg)   SpO2 97%   BMI 30.13 kg/m  Weight yesterday-not taken Last visit weight-217lb  ATF [name] A&O x 4, skin warm and dry with good color.  Pt denies C/P or SOB.  Lung sounds clear and equal bilat.  No pedal edema noted no JVD.  Pt missed taking two evening doses of his medications.  Med box reconciled, filled for 1 week.  Worked with pt to fill out [employer] Assistance Form.  Pt did not file taxes for last year.  I called [employer] and they advised he could provide them a copy of last year's W2 as proof of income instead.  Pt. States he will have to go pick up a copy of last year's W-2 and then we can fax it to [employer].  He states he has also applied for medicaid also and I have encouraged him to stay on top of this so he can get assistance.  Needs to get established with a PCP and we discussed [employer] [facility].  Message sent to Willie Campbell @ Willie Campbell. Home visit complete.  Patient Care Team: Patient, No Pcp Per as PCP - General (General Practice) Willie Records, MD as PCP - Cardiology (Cardiology)  Patient Active Problem List   Diagnosis Date Noted   Cardiac arrest [facility]) 02/26/2022   STEMI (ST elevation myocardial infarction) (HCC) 02/26/2022   Acute systolic heart failure (HCC)    Cardiogenic shock (HCC)     Current Outpatient Medications:    aspirin 81 MG chewable tablet, Chew 1 tablet (81 mg total) by mouth daily., Disp: 31 tablet, Rfl: 5   atorvastatin (LIPITOR) 40 MG tablet, Take 1 tablet (40 mg total) by mouth daily., Disp: 30 tablet, Rfl: 5   carvedilol (COREG) 3.125 MG tablet, Take 1 tablet (3.125 mg total) by mouth 2 (two) times daily with a meal., Disp: 62 tablet, Rfl: 5   empagliflozin (JARDIANCE) 10 MG TABS tablet, Take 1 tablet (10 mg total) by  mouth daily., Disp: 30 tablet, Rfl: 5   nitroGLYCERIN (NITROSTAT) 0.4 MG SL tablet, Place 1 tablet (0.4 mg total) under the tongue every 5 (five) minutes as needed for chest pain., Disp: 25 tablet, Rfl: 0   sacubitril-valsartan (ENTRESTO) 24-26 MG, Take 1 tablet by mouth 2 (two) times daily., Disp: 60 tablet, Rfl: 3   spironolactone (ALDACTONE) 25 MG tablet, Take 1 tablet (25 mg total) by mouth daily., Disp: 30 tablet, Rfl: 5   ticagrelor (BRILINTA) 90 MG TABS tablet, Take 1 tablet (90 mg total) by mouth 2 (two) times daily., Disp: 60 tablet, Rfl: 5 No Known Allergies    Social History   Socioeconomic History   Marital status: Single    Spouse name: Not on file   Number of children: Not on file   Years of education: Not on file   Highest education level: Not on file  Occupational History   Not on file  Tobacco Use   Smoking status: Former    Types: Cigarettes    Quit date: 02/26/2022    Years since quitting: 0.2   Smokeless tobacco: Never  Substance and Sexual Activity   Alcohol use: Not Currently    Comment: sometimes  Drug use: No   Sexual activity: Not on file  Other Topics Concern   Not on file  Social History Narrative   Not on file   Social Determinants of Health   Financial Resource Strain: High Risk (03/15/2022)   Overall Financial Resource Strain (CARDIA)    Difficulty of Paying Living Expenses: Hard  Food Insecurity: No Food Insecurity (03/15/2022)   Hunger Vital Sign    Worried About Running Out of Food in the Last Year: Never true    Ran Out of Food in the Last Year: Never true  Transportation Needs: No Transportation Needs (03/15/2022)   PRAPARE - Administrator, Civil Service (Medical): No    Lack of Transportation (Non-Medical): No  Physical Activity: Not on file  Stress: Not on file  Social Connections: Not on file  Intimate Partner Violence: Not on file    Physical Exam      Future Appointments  Date Time Provider Department Center   08/05/2022  1:00 PM [facility] ECHO OP 1 [facility]  08/05/2022  2:00 PM Bensimhon, Willie Buckles, MD MC-[facility] None       Beatrix Shipper, EMT-Paramedic [phone] [facility] Paramedic  05/12/22

## 2022-05-21 ENCOUNTER — Other Ambulatory Visit (HOSPITAL_COMMUNITY): Payer: Self-pay | Admitting: Emergency Medicine

## 2022-05-21 NOTE — Progress Notes (Signed)
Paramedicine Encounter    Patient ID: Willie Campbell, male    DOB: [birth year], 51 y.o.   MRN: [id number]   BP 108/70 (BP Location: Right Arm, Patient Position: Sitting, Cuff Size: Normal)   Pulse 76   Resp 16   Wt 218 lb (98.9 kg)   SpO2 98%   BMI 30.40 kg/m   Weight yesterday-not taken Last visit weight-216lb  Willie Campbell A&O x 4 skin warm and dry with good color.  Pt. Denies chest pain or SOB.  No edema or JVD noted.  Lung sounds clear and equal bilat.  Pt. Missed 1 evening dose of last weeks medications.  Med box reconciled.  Got [employer] paperwork from him and his W-2 for his financial assistance for his Willie Campbell.  I will deliver to clinic to [facility] for her to complete and send out for him.  Delivered paperwork to Willie Campbell at clinic and made copy of W-2 for her and secured his original which I will return to him Tuesday and this was discussed by phone conversation.  Home visit complete   Patient Care Team: Patient, No Pcp Per as PCP - General (General Practice) Willie Records, MD as PCP - Cardiology (Cardiology)  Patient Active Problem List   Diagnosis Date Noted   Cardiac arrest [facility]) 02/26/2022   STEMI (ST elevation myocardial infarction) (HCC) 02/26/2022   Acute systolic heart failure (HCC)    Cardiogenic shock (HCC)     Current Outpatient Medications:    aspirin 81 MG chewable tablet, Chew 1 tablet (81 mg total) by mouth daily., Disp: 31 tablet, Rfl: 5   atorvastatin (LIPITOR) 40 MG tablet, Take 1 tablet (40 mg total) by mouth daily., Disp: 30 tablet, Rfl: 5   carvedilol (COREG) 3.125 MG tablet, Take 1 tablet (3.125 mg total) by mouth 2 (two) times daily with a meal., Disp: 62 tablet, Rfl: 5   empagliflozin (JARDIANCE) 10 MG TABS tablet, Take 1 tablet (10 mg total) by mouth daily., Disp: 30 tablet, Rfl: 5   nitroGLYCERIN (NITROSTAT) 0.4 MG SL tablet, Place 1 tablet (0.4 mg total) under the tongue every 5 (five) minutes as needed for chest pain., Disp: 25 tablet, Rfl: 0    sacubitril-valsartan (ENTRESTO) 24-26 MG, Take 1 tablet by mouth 2 (two) times daily., Disp: 60 tablet, Rfl: 3   spironolactone (ALDACTONE) 25 MG tablet, Take 1 tablet (25 mg total) by mouth daily., Disp: 30 tablet, Rfl: 5   ticagrelor (BRILINTA) 90 MG TABS tablet, Take 1 tablet (90 mg total) by mouth 2 (two) times daily., Disp: 60 tablet, Rfl: 5 No Known Allergies    Social History   Socioeconomic History   Marital status: Single    Spouse name: Not on file   Number of children: Not on file   Years of education: Not on file   Highest education level: Not on file  Occupational History   Not on file  Tobacco Use   Smoking status: Former    Types: Cigarettes    Quit date: 02/26/2022    Years since quitting: 0.2   Smokeless tobacco: Never  Substance and Sexual Activity   Alcohol use: Not Currently    Comment: sometimes   Drug use: No   Sexual activity: Not on file  Other Topics Concern   Not on file  Social History Narrative   Not on file   Social Determinants of Health   Financial Resource Strain: High Risk (03/15/2022)   Overall Financial Resource Strain (CARDIA)  Difficulty of Paying Living Expenses: Hard  Food Insecurity: No Food Insecurity (03/15/2022)   Hunger Vital Sign    Worried About Running Out of Food in the Last Year: Never true    Ran Out of Food in the Last Year: Never true  Transportation Needs: No Transportation Needs (03/15/2022)   PRAPARE - Administrator, Civil Service (Medical): No    Lack of Transportation (Non-Medical): No  Physical Activity: Not on file  Stress: Not on file  Social Connections: Not on file  Intimate Partner Violence: Not on file    Physical Exam      Future Appointments  Date Time Provider Department Center  06/21/2022  8:50 AM Willie Rigg, NP [facility]W-[facility] None  08/05/2022  1:00 PM [facility] ECHO OP 1 [facility]  08/05/2022  2:00 PM Bensimhon, Bevelyn Buckles, MD MC-[facility] None       Willie Campbell,  EMT-Paramedic [phone] [facility] Paramedic  05/21/22

## 2022-05-21 NOTE — Telephone Encounter (Signed)
Sent POI to [employer] via fax.  ?

## 2022-05-28 ENCOUNTER — Other Ambulatory Visit (HOSPITAL_COMMUNITY): Payer: Self-pay | Admitting: Emergency Medicine

## 2022-05-28 NOTE — Progress Notes (Signed)
Paramedicine Encounter    Patient ID: Willie Campbell, male    DOB: 01/23/1971, 51 y.o.   MRN: [id number]   There were no vitals taken for this visit. Weight yesterday-did not weigh Last visit weight-218lb  ATF Mr. Panas A&O x 4, skin warm and dry with good color.  Pt denies chest pain or SOB.  Lung sounds clear and equal bilat.  No peripheral edema noted.  No JVD.  Pt. Missed 2 doses out of his weeks worth of meds.  Pill box reconciled.  Needs refill of his Cleda Daub and [place].  Pt reports that he has been trying to exercise - taking walks and short runs.  He denies any exertional chest pain and no significant SOB w/ his exercise.  Still waiting to get approved for Medicaid and working on getting his disability.  Home visit complete.   Patient Care Team: Patient, No Pcp Per as PCP - General (General Practice) Lyn Records, MD as PCP - Cardiology (Cardiology)  Patient Active Problem List   Diagnosis Date Noted   Cardiac arrest [facility]) 02/26/2022   STEMI (ST elevation myocardial infarction) (HCC) 02/26/2022   Acute systolic heart failure (HCC)    Cardiogenic shock (HCC)     Current Outpatient Medications:    aspirin 81 MG chewable tablet, Chew 1 tablet (81 mg total) by mouth daily., Disp: 31 tablet, Rfl: 5   atorvastatin (LIPITOR) 40 MG tablet, Take 1 tablet (40 mg total) by mouth daily., Disp: 30 tablet, Rfl: 5   carvedilol (COREG) 3.125 MG tablet, Take 1 tablet (3.125 mg total) by mouth 2 (two) times daily with a meal., Disp: 62 tablet, Rfl: 5   sacubitril-valsartan (ENTRESTO) 24-26 MG, Take 1 tablet by mouth 2 (two) times daily., Disp: 60 tablet, Rfl: 3   spironolactone (ALDACTONE) 25 MG tablet, Take 1 tablet (25 mg total) by mouth daily., Disp: 30 tablet, Rfl: 5   ticagrelor (BRILINTA) 90 MG TABS tablet, Take 1 tablet (90 mg total) by mouth 2 (two) times daily., Disp: 60 tablet, Rfl: 5   empagliflozin (JARDIANCE) 10 MG TABS tablet, Take 1 tablet (10 mg total) by mouth daily., Disp: 30  tablet, Rfl: 5   nitroGLYCERIN (NITROSTAT) 0.4 MG SL tablet, Place 1 tablet (0.4 mg total) under the tongue every 5 (five) minutes as needed for chest pain. (Patient not taking: Reported on 05/28/2022), Disp: 25 tablet, Rfl: 0 No Known Allergies    Social History   Socioeconomic History   Marital status: Single    Spouse name: Not on file   Number of children: Not on file   Years of education: Not on file   Highest education level: Not on file  Occupational History   Not on file  Tobacco Use   Smoking status: Former    Types: Cigarettes    Quit date: 02/26/2022    Years since quitting: 0.2   Smokeless tobacco: Never  Substance and Sexual Activity   Alcohol use: Not Currently    Comment: sometimes   Drug use: No   Sexual activity: Not on file  Other Topics Concern   Not on file  Social History Narrative   Not on file   Social Determinants of Health   Financial Resource Strain: High Risk (03/15/2022)   Overall Financial Resource Strain (CARDIA)    Difficulty of Paying Living Expenses: Hard  Food Insecurity: No Food Insecurity (03/15/2022)   Hunger Vital Sign    Worried About Running Out of Food in the Last  Year: Never true    Ran Out of Food in the Last Year: Never true  Transportation Needs: No Transportation Needs (03/15/2022)   PRAPARE - Administrator, Civil Service (Medical): No    Lack of Transportation (Non-Medical): No  Physical Activity: Not on file  Stress: Not on file  Social Connections: Not on file  Intimate Partner Violence: Not on file    Physical Exam      Future Appointments  Date Time Provider Department Center  06/21/2022  8:50 AM Claiborne Rigg, NP [facility] None  08/05/2022  1:00 PM [facility] ECHO OP 1 [facility]OL[facility]  08/05/2022  2:00 PM Bensimhon, Bevelyn Buckles, MD [facility] None       Beatrix Shipper, EMT-Paramedic [phone] [facility] Paramedic  05/28/22

## 2022-06-01 ENCOUNTER — Encounter (HOSPITAL_COMMUNITY): Payer: Self-pay

## 2022-06-01 NOTE — Progress Notes (Unsigned)
Medication Samples have been provided to the patient.  Drug name: Brilinta       Strength: 90 mg        Qty: 2  LOT: [id number]  Exp.Date: 05/14/2024  Dosing instructions: Take 1 tablet Twice daily   The patient has been instructed regarding the correct time, dose, and frequency of taking this medication, including desired effects and most common side effects.   Smitty Cords Kortney Schoenfelder 3:43 PM 06/01/2022

## 2022-06-01 NOTE — Telephone Encounter (Signed)
Advanced Heart Failure Patient Advocate Encounter   Patient was approved to receive Entresto from [employer]  Effective dates: 05/25/22 through 05/26/23  Provided phone number and approval information to Dede (EMT).  Archer Asa, CPhT

## 2022-06-03 ENCOUNTER — Other Ambulatory Visit (HOSPITAL_COMMUNITY): Payer: Self-pay

## 2022-06-03 ENCOUNTER — Other Ambulatory Visit (HOSPITAL_COMMUNITY): Payer: Self-pay | Admitting: Emergency Medicine

## 2022-06-03 NOTE — Progress Notes (Signed)
Paramedicine Encounter    Patient ID: Willie Campbell, male    DOB: [birth year], 51 y.o.   MRN: [id number]   BP 100/70 (BP Location: Right Arm, Patient Position: Sitting, Cuff Size: Normal)   Pulse 80   Resp 16   SpO2 96%  Weight yesterday-not taken Last visit weight-214lb   Home visit today with pt at his daughter's house.  Pt. CA&O x 4, skin warm and dry with good color.  Pt has no complaints of chest pain or SOB.  Lung sounds clear and equal bilat.  No edema to lower extremities.  Meds reconciled.  Pt called [employer] and [employer] for refills on his Entresto and Brilinta.  Meds should be arriving in the next 6-10 business days.  Home visit complete.  Called in refills for: ASA, Atorvastatin, Carvedilol, Marice Potter,  Patient Care Team: Patient, No Pcp Per as PCP - General (General Practice) Lyn Records, MD as PCP - Cardiology (Cardiology)  Patient Active Problem List   Diagnosis Date Noted   Cardiac arrest [facility]) 02/26/2022   STEMI (ST elevation myocardial infarction) (HCC) 02/26/2022   Acute systolic heart failure (HCC)    Cardiogenic shock (HCC)     Current Outpatient Medications:    aspirin 81 MG chewable tablet, Chew 1 tablet (81 mg total) by mouth daily., Disp: 31 tablet, Rfl: 5   atorvastatin (LIPITOR) 40 MG tablet, Take 1 tablet (40 mg total) by mouth daily., Disp: 30 tablet, Rfl: 5   carvedilol (COREG) 3.125 MG tablet, Take 1 tablet (3.125 mg total) by mouth 2 (two) times daily with a meal., Disp: 62 tablet, Rfl: 5   empagliflozin (JARDIANCE) 10 MG TABS tablet, Take 1 tablet (10 mg total) by mouth daily., Disp: 30 tablet, Rfl: 5   sacubitril-valsartan (ENTRESTO) 24-26 MG, Take 1 tablet by mouth 2 (two) times daily., Disp: 60 tablet, Rfl: 3   spironolactone (ALDACTONE) 25 MG tablet, Take 1 tablet (25 mg total) by mouth daily., Disp: 30 tablet, Rfl: 5   ticagrelor (BRILINTA) 90 MG TABS tablet, Take 1 tablet (90 mg total) by mouth 2 (two) times daily., Disp: 60 tablet, Rfl:  5   nitroGLYCERIN (NITROSTAT) 0.4 MG SL tablet, Place 1 tablet (0.4 mg total) under the tongue every 5 (five) minutes as needed for chest pain. (Patient not taking: Reported on 05/28/2022), Disp: 25 tablet, Rfl: 0 No Known Allergies    Social History   Socioeconomic History   Marital status: Single    Spouse name: Not on file   Number of children: Not on file   Years of education: Not on file   Highest education level: Not on file  Occupational History   Not on file  Tobacco Use   Smoking status: Former    Types: Cigarettes    Quit date: 02/26/2022    Years since quitting: 0.2   Smokeless tobacco: Never  Substance and Sexual Activity   Alcohol use: Not Currently    Comment: sometimes   Drug use: No   Sexual activity: Not on file  Other Topics Concern   Not on file  Social History Narrative   Not on file   Social Determinants of Health   Financial Resource Strain: High Risk (03/15/2022)   Overall Financial Resource Strain (CARDIA)    Difficulty of Paying Living Expenses: Hard  Food Insecurity: No Food Insecurity (03/15/2022)   Hunger Vital Sign    Worried About Running Out of Food in the Last Year: Never true  Ran Out of Food in the Last Year: Never true  Transportation Needs: No Transportation Needs (03/15/2022)   PRAPARE - Administrator, Civil Service (Medical): No    Lack of Transportation (Non-Medical): No  Physical Activity: Not on file  Stress: Not on file  Social Connections: Not on file  Intimate Partner Violence: Not on file    Physical Exam      Future Appointments  Date Time Provider Department Center  06/21/2022  8:50 AM Claiborne Rigg, NP [facility] None  08/05/2022  1:00 PM [facility] ECHO OP 1 [facility]OL[facility]  08/05/2022  2:00 PM Bensimhon, Bevelyn Buckles, MD [facility] None       Beatrix Shipper, EMT-Paramedic [phone] [facility] Paramedic  06/03/22

## 2022-06-04 NOTE — Telephone Encounter (Addendum)
Advanced Heart Failure Patient Advocate Encounter  Sent Medicaid denial to [employer] via fax. Document scanned to chart.

## 2022-06-11 ENCOUNTER — Other Ambulatory Visit (HOSPITAL_COMMUNITY): Payer: Self-pay

## 2022-06-11 ENCOUNTER — Other Ambulatory Visit (HOSPITAL_COMMUNITY): Payer: Self-pay | Admitting: Emergency Medicine

## 2022-06-11 NOTE — Progress Notes (Signed)
Paramedicine Encounter    Patient ID: Willie Campbell, male    DOB: 1971-09-04, 51 y.o.   MRN: [id number]   BP 120/90 (BP Location: Left Arm, Patient Position: Sitting, Cuff Size: Normal)   Pulse 78   Resp 16   Wt 220 lb (99.8 kg)   SpO2 96%   BMI 30.68 kg/m  Weight yesterday-not taken Last visit weight-214lb  Mr. Abel reports to be doing well today.  He has no complaints of chest pain or SOB.  Lung sounds clar and equal bilat.  No edema or JVD noted.  He has been compliant with his meds.  I picked up ASA, Carvedilol , Atorvastatin, Marice Potter at [facility] and delivered to pt.  Med box reconciled.  He did not have any Brillinta on hand.  He states it came in the mail and is at his sister's house.  He says he will go pick it up later and add it to his med box 1 in a.m. and 1 in p.m.  No other needs at this time and home visit complete.  Patient Care Team: Patient, No Pcp Per as PCP - General (General Practice) Lyn Records, MD as PCP - Cardiology (Cardiology)  Patient Active Problem List   Diagnosis Date Noted   Cardiac arrest [facility]) 02/26/2022   STEMI (ST elevation myocardial infarction) (HCC) 02/26/2022   Acute systolic heart failure (HCC)    Cardiogenic shock (HCC)     Current Outpatient Medications:    aspirin 81 MG chewable tablet, Chew 1 tablet (81 mg total) by mouth daily., Disp: 31 tablet, Rfl: 5   atorvastatin (LIPITOR) 40 MG tablet, Take 1 tablet (40 mg total) by mouth daily., Disp: 30 tablet, Rfl: 5   carvedilol (COREG) 3.125 MG tablet, Take 1 tablet (3.125 mg total) by mouth 2 (two) times daily with a meal., Disp: 62 tablet, Rfl: 5   empagliflozin (JARDIANCE) 10 MG TABS tablet, Take 1 tablet (10 mg total) by mouth daily., Disp: 30 tablet, Rfl: 5   sacubitril-valsartan (ENTRESTO) 24-26 MG, Take 1 tablet by mouth 2 (two) times daily., Disp: 60 tablet, Rfl: 3   spironolactone (ALDACTONE) 25 MG tablet, Take 1 tablet (25 mg total) by mouth daily., Disp: 30 tablet,  Rfl: 5   ticagrelor (BRILINTA) 90 MG TABS tablet, Take 1 tablet (90 mg total) by mouth 2 (two) times daily., Disp: 60 tablet, Rfl: 5   nitroGLYCERIN (NITROSTAT) 0.4 MG SL tablet, Place 1 tablet (0.4 mg total) under the tongue every 5 (five) minutes as needed for chest pain. (Patient not taking: Reported on 05/28/2022), Disp: 25 tablet, Rfl: 0 No Known Allergies    Social History   Socioeconomic History   Marital status: Single    Spouse name: Not on file   Number of children: Not on file   Years of education: Not on file   Highest education level: Not on file  Occupational History   Not on file  Tobacco Use   Smoking status: Former    Types: Cigarettes    Quit date: 02/26/2022    Years since quitting: 0.2   Smokeless tobacco: Never  Substance and Sexual Activity   Alcohol use: Not Currently    Comment: sometimes   Drug use: No   Sexual activity: Not on file  Other Topics Concern   Not on file  Social History Narrative   Not on file   Social Determinants of Health   Financial Resource Strain: High Risk (03/15/2022)  Overall Financial Resource Strain (CARDIA)    Difficulty of Paying Living Expenses: Hard  Food Insecurity: No Food Insecurity (03/15/2022)   Hunger Vital Sign    Worried About Running Out of Food in the Last Year: Never true    Ran Out of Food in the Last Year: Never true  Transportation Needs: No Transportation Needs (03/15/2022)   PRAPARE - Administrator, Civil Service (Medical): No    Lack of Transportation (Non-Medical): No  Physical Activity: Not on file  Stress: Not on file  Social Connections: Not on file  Intimate Partner Violence: Not on file    Physical Exam      Future Appointments  Date Time Provider Department Center  06/21/2022  8:50 AM Claiborne Rigg, NP [facility] None  08/05/2022  1:00 PM [facility] ECHO OP 1 [facility]  08/05/2022  2:00 PM Bensimhon, Bevelyn Buckles, MD [facility] None       Beatrix Shipper,  EMT-Paramedic [phone] [facility] Paramedic  06/11/22

## 2022-06-17 NOTE — Telephone Encounter (Signed)
Attempted to call [employer] for a status update regarding the patient's Brilinta application now that the Medicaid denial has been sent in. [employer] is closed today for a training.  Will call back.

## 2022-06-18 ENCOUNTER — Telehealth (HOSPITAL_COMMUNITY): Payer: Self-pay | Admitting: Emergency Medicine

## 2022-06-18 ENCOUNTER — Other Ambulatory Visit (HOSPITAL_COMMUNITY): Payer: Self-pay

## 2022-06-18 ENCOUNTER — Other Ambulatory Visit (HOSPITAL_COMMUNITY): Payer: Self-pay | Admitting: Emergency Medicine

## 2022-06-18 ENCOUNTER — Telehealth (HOSPITAL_COMMUNITY): Payer: Self-pay | Admitting: Cardiology

## 2022-06-18 NOTE — Telephone Encounter (Signed)
Contacted Mr. Consiglio to let him  I was able to p/u samples of Brilllinta.  He still hasn't heard back from [employer] regarding status.

## 2022-06-18 NOTE — Progress Notes (Signed)
Paramedicine Encounter    Patient ID: Willie Campbell, male    DOB: May 13, 1971, 51 y.o.   MRN: [id number]   There were no vitals taken for this visit. Weight yesterday-not taken Last visit weight-220lb  ATF Willie Campbell A&O x 4, skin W&D w/ good color.  Pt. Denies chest pain or SOB.  Lung sounds clear and equal bilat.  No edema to extremities and no JVD.  Pt. Is compliant with his meds minus his Brillinta.  He called [employer] on 7/20 and it was to ship in 6-10 business days.  He still has not received.  Med box reconciled minus Brillinta.  He called back today and was advised they are still waiting on a determination and the company will call him back later today.  Home visit complete.   Patient Care Team: Patient, No Pcp Per as PCP - General (General Practice) Lyn Records, MD as PCP - Cardiology (Cardiology)  Patient Active Problem List   Diagnosis Date Noted  . Cardiac arrest (HCC) 02/26/2022  . STEMI (ST elevation myocardial infarction) (HCC) 02/26/2022  . Acute systolic heart failure (HCC)   . Cardiogenic shock (HCC)     Current Outpatient Medications:  .  aspirin 81 MG chewable tablet, Chew 1 tablet (81 mg total) by mouth daily., Disp: 31 tablet, Rfl: 5 .  atorvastatin (LIPITOR) 40 MG tablet, Take 1 tablet (40 mg total) by mouth daily., Disp: 30 tablet, Rfl: 5 .  carvedilol (COREG) 3.125 MG tablet, Take 1 tablet (3.125 mg total) by mouth 2 (two) times daily with a meal., Disp: 62 tablet, Rfl: 5 .  empagliflozin (JARDIANCE) 10 MG TABS tablet, Take 1 tablet (10 mg total) by mouth daily., Disp: 30 tablet, Rfl: 5 .  nitroGLYCERIN (NITROSTAT) 0.4 MG SL tablet, Place 1 tablet (0.4 mg total) under the tongue every 5 (five) minutes as needed for chest pain. (Patient not taking: Reported on 05/28/2022), Disp: 25 tablet, Rfl: 0 .  sacubitril-valsartan (ENTRESTO) 24-26 MG, Take 1 tablet by mouth 2 (two) times daily., Disp: 60 tablet, Rfl: 3 .  spironolactone (ALDACTONE) 25 MG tablet, Take 1 tablet (25  mg total) by mouth daily., Disp: 30 tablet, Rfl: 5 .  ticagrelor (BRILINTA) 90 MG TABS tablet, Take 1 tablet (90 mg total) by mouth 2 (two) times daily., Disp: 60 tablet, Rfl: 5 No Known Allergies    Social History   Socioeconomic History  . Marital status: Single    Spouse name: Not on file  . Number of children: Not on file  . Years of education: Not on file  . Highest education level: Not on file  Occupational History  . Not on file  Tobacco Use  . Smoking status: Former    Types: Cigarettes    Quit date: 02/26/2022    Years since quitting: 0.3  . Smokeless tobacco: Never  Substance and Sexual Activity  . Alcohol use: Not Currently    Comment: sometimes  . Drug use: No  . Sexual activity: Not on file  Other Topics Concern  . Not on file  Social History Narrative  . Not on file   Social Determinants of Health   Financial Resource Strain: High Risk (03/15/2022)   Overall Financial Resource Strain (CARDIA)   . Difficulty of Paying Living Expenses: Hard  Food Insecurity: No Food Insecurity (03/15/2022)   Hunger Vital Sign   . Worried About Programme researcher, broadcasting/film/video in the Last Year: Never true   . Ran Out of Food  in the Last Year: Never true  Transportation Needs: No Transportation Needs (03/15/2022)   PRAPARE - Transportation   . Lack of Transportation (Medical): No   . Lack of Transportation (Non-Medical): No  Physical Activity: Not on file  Stress: Not on file  Social Connections: Not on file  Intimate Partner Violence: Not on file    Physical Exam      Future Appointments  Date Time Provider Department Center  06/21/2022  8:50 AM Claiborne Rigg, NP [facility] None  08/05/2022  1:00 PM [facility] ECHO OP 1 [facility]  08/05/2022  2:00 PM Bensimhon, Bevelyn Buckles, MD [facility] None       Beatrix Shipper, EMT-Paramedic [phone] [facility] Paramedic  06/18/22

## 2022-06-18 NOTE — Telephone Encounter (Signed)
Samples are needed until patient assistance mail order is received  Medication Samples have been provided to the patient.  Drug name: brilinta       Strength: 90 mg        Qty: 32  LOT: [id number]  Exp.Date: 05/14/1924  Dosing instructions: ONE TAB TWICE A DAY   The patient has been instructed regarding the correct time, dose, and frequency of taking this medication, including desired effects and most common side effects.   Magda Bernheim M 1:29 PM 06/18/2022

## 2022-06-21 ENCOUNTER — Ambulatory Visit: Payer: Self-pay | Admitting: Nurse Practitioner

## 2022-06-21 NOTE — Telephone Encounter (Signed)
Advanced Heart Failure Patient Advocate Encounter   Patient was approved to receive Brilinta from [employer]  Effective dates: 06/18/22 through 06/19/23  Document scanned to chart. Sent Dede (EMT) approval information.  Archer Asa, CPhT

## 2022-06-24 ENCOUNTER — Other Ambulatory Visit (HOSPITAL_COMMUNITY): Payer: Self-pay | Admitting: Emergency Medicine

## 2022-06-24 NOTE — Progress Notes (Signed)
Paramedicine Encounter    Patient ID: Willie Campbell, male    DOB: 11-07-1971, 51 y.o.   MRN: [id number]   There were no vitals taken for this visit. Weight yesterday-not taken Last visit weight-223lb ATF Mr. Dorantes A&O x4, skin W&D w/ good color.  Pt missed 2 doses of Entresto and 2 Carvedilol out of the weeks med regimen.  He denies chest pain or SOB.  He was finally approved for Brillinta thru [employer].  Med Box reconciled for two weeks.  Refills or Jardiance, ASA, and Cleda Daub will be needed in two weeks.  Pt states he is feeling good but does still have his normal SOB with exertion.  Lung sounds clear and equal bilat.  NO edema to his lower extremities and no JVD.  He has an appointment with Bertram Denver 8/21 to get established with PCP.  Home visit complete.   Patient Care Team: Patient, No Pcp Per as PCP - General (General Practice) Lyn Records, MD as PCP - Cardiology (Cardiology)  Patient Active Problem List   Diagnosis Date Noted   Cardiac arrest [facility]) 02/26/2022   STEMI (ST elevation myocardial infarction) (HCC) 02/26/2022   Acute systolic heart failure (HCC)    Cardiogenic shock (HCC)     Current Outpatient Medications:    aspirin 81 MG chewable tablet, Chew 1 tablet (81 mg total) by mouth daily., Disp: 31 tablet, Rfl: 5   atorvastatin (LIPITOR) 40 MG tablet, Take 1 tablet (40 mg total) by mouth daily., Disp: 30 tablet, Rfl: 5   carvedilol (COREG) 3.125 MG tablet, Take 1 tablet (3.125 mg total) by mouth 2 (two) times daily with a meal., Disp: 62 tablet, Rfl: 5   empagliflozin (JARDIANCE) 10 MG TABS tablet, Take 1 tablet (10 mg total) by mouth daily., Disp: 30 tablet, Rfl: 5   nitroGLYCERIN (NITROSTAT) 0.4 MG SL tablet, Place 1 tablet (0.4 mg total) under the tongue every 5 (five) minutes as needed for chest pain. (Patient not taking: Reported on 05/28/2022), Disp: 25 tablet, Rfl: 0   sacubitril-valsartan (ENTRESTO) 24-26 MG, Take 1 tablet by mouth 2 (two) times daily., Disp: 60 tablet,  Rfl: 3   spironolactone (ALDACTONE) 25 MG tablet, Take 1 tablet (25 mg total) by mouth daily., Disp: 30 tablet, Rfl: 5   ticagrelor (BRILINTA) 90 MG TABS tablet, Take 1 tablet (90 mg total) by mouth 2 (two) times daily. (Patient not taking: Reported on 06/18/2022), Disp: 60 tablet, Rfl: 5 No Known Allergies    Social History   Socioeconomic History   Marital status: Single    Spouse name: Not on file   Number of children: Not on file   Years of education: Not on file   Highest education level: Not on file  Occupational History   Not on file  Tobacco Use   Smoking status: Former    Types: Cigarettes    Quit date: 02/26/2022    Years since quitting: 0.3   Smokeless tobacco: Never  Substance and Sexual Activity   Alcohol use: Not Currently    Comment: sometimes   Drug use: No   Sexual activity: Not on file  Other Topics Concern   Not on file  Social History Narrative   Not on file   Social Determinants of Health   Financial Resource Strain: High Risk (03/15/2022)   Overall Financial Resource Strain (CARDIA)    Difficulty of Paying Living Expenses: Hard  Food Insecurity: No Food Insecurity (03/15/2022)   Hunger Vital Sign  Worried About Programme researcher, broadcasting/film/video in the Last Year: Never true    Ran Out of Food in the Last Year: Never true  Transportation Needs: No Transportation Needs (03/15/2022)   PRAPARE - Administrator, Civil Service (Medical): No    Lack of Transportation (Non-Medical): No  Physical Activity: Not on file  Stress: Not on file  Social Connections: Not on file  Intimate Partner Violence: Not on file    Physical Exam      Future Appointments  Date Time Provider Department Center  07/05/2022  8:50 AM Claiborne Rigg, NP [facility] None  08/05/2022  1:00 PM [facility] ECHO OP 1 [facility]  08/05/2022  2:00 PM Bensimhon, Bevelyn Buckles, MD [facility] None       Beatrix Shipper, EMT-Paramedic [phone] [facility] Paramedic  06/24/22

## 2022-07-05 ENCOUNTER — Encounter: Payer: Self-pay | Admitting: Nurse Practitioner

## 2022-07-05 ENCOUNTER — Ambulatory Visit: Payer: Self-pay | Attending: Nurse Practitioner | Admitting: Nurse Practitioner

## 2022-07-05 VITALS — BP 121/74 | HR 83 | Temp 98.1°F | Ht 71.0 in | Wt 223.6 lb

## 2022-07-05 DIAGNOSIS — Z7689 Persons encountering health services in other specified circumstances: Secondary | ICD-10-CM

## 2022-07-05 DIAGNOSIS — Z23 Encounter for immunization: Secondary | ICD-10-CM

## 2022-07-05 NOTE — Progress Notes (Addendum)
Assessment & Plan:  Woodruff was seen today for new patient (initial visit).  Diagnoses and all orders for this visit:  Encounter to establish care Continue all medications as prescribed Continue to weigh yourself daily or as instructed DASH diet Stop smoking   Need for Tdap vaccination -     Tdap vaccine greater than or equal to 51yo IM    Patient has been counseled on age-appropriate routine health concerns for screening and prevention. These are reviewed and up-to-date. Referrals have been placed accordingly. Immunizations are up-to-date or declined.    Subjective:   Chief Complaint  Patient presents with   New Patient (Initial Visit)   HPI Willie Campbell 51 y.o. male presents to office today to establish care.  He has a past medical history of CAD, Cardiac arrest (02/26/2022), CHF, and STEMI (02/26/2022).   Started smoking at the age of 85 (34 years ago) and currently smokes about 3 cigarettes a day.   Admitted 4-14-202 for 3 days with VF arrest, Acute anterolateral STEMI, cardiogenic shock/cardiac arrest, acute systolic CHF, acute hypoxic resp failure, shock liver, AKI. Required CPR and multiple shocks in ambulance.  Required pressors, IV lasix and PCI/DES for totally occluded LAD with EF 20-25%. Extubated 4-15 (Echo with EF 40% post procedure). Treated with abx for possible aspiration PNA vs pneumonitis.  Discharged home on ASA and brillinta (can switch to plavix if cost an issue), low dose coreg, atorvastatin, entresto, spironolactone, jardiance, a scale and a pill box.   Since being discharged he has been evaluated OP by cardiology and the outreach HF paramedicine program. Endorses shortness of breath with exertion. Blood pressure is well controlled.   BP Readings from Last 3 Encounters:  07/05/22 121/74  06/24/22 110/78  06/18/22 110/80       Review of Systems  Constitutional:  Negative for fever, malaise/fatigue and weight loss.  HENT: Negative.  Negative for  nosebleeds.   Eyes: Negative.  Negative for blurred vision, double vision and photophobia.  Respiratory:  Positive for shortness of breath (with exertion). Negative for cough.   Cardiovascular: Negative.  Negative for chest pain, palpitations and leg swelling.  Gastrointestinal: Negative.  Negative for heartburn, nausea and vomiting.  Musculoskeletal: Negative.  Negative for myalgias.  Neurological: Negative.  Negative for dizziness, focal weakness, seizures and headaches.  Psychiatric/Behavioral: Negative.  Negative for suicidal ideas.     Past Medical History:  Diagnosis Date   CAD (coronary artery disease)    Cardiac arrest (HCC) 02/26/2022   CHF (congestive heart failure) (HCC)    STEMI (ST elevation myocardial infarction) (HCC) 02/26/2022    Past Surgical History:  Procedure Laterality Date   CORONARY STENT INTERVENTION N/A 02/26/2022   Procedure: CORONARY STENT INTERVENTION;  Surgeon: Lyn Records, MD;  Location[facility] IN[facility];  Service: Cardiovascular;  Laterality: N/A;   HERNIA REPAIR     RIGHT/LEFT HEART CATH AND CORONARY ANGIOGRAPHY N/A 02/26/2022   Procedure: RIGHT/LEFT HEART CATH AND CORONARY ANGIOGRAPHY;  Surgeon: Lyn Records, MD;  Location[facility];  Service: Cardiovascular;  Laterality: N/A;    History reviewed. No pertinent family history.  Social History Reviewed with no changes to be made today.   Outpatient Medications Prior to Visit  Medication Sig Dispense Refill   aspirin 81 MG chewable tablet Chew 1 tablet (81 mg total) by mouth daily. 31 tablet 5   atorvastatin (LIPITOR) 40 MG tablet Take 1 tablet (40 mg total) by mouth daily. 30 tablet 5  carvedilol (COREG) 3.125 MG tablet Take 1 tablet (3.125 mg total) by mouth 2 (two) times daily with a meal. 62 tablet 5   empagliflozin (JARDIANCE) 10 MG TABS tablet Take 1 tablet (10 mg total) by mouth daily. 30 tablet 5   nitroGLYCERIN (NITROSTAT) 0.4 MG SL tablet Place 1 tablet (0.4 mg total) under  the tongue every 5 (five) minutes as needed for chest pain. 25 tablet 0   sacubitril-valsartan (ENTRESTO) 24-26 MG Take 1 tablet by mouth 2 (two) times daily. 60 tablet 3   spironolactone (ALDACTONE) 25 MG tablet Take 1 tablet (25 mg total) by mouth daily. 30 tablet 5   ticagrelor (BRILINTA) 90 MG TABS tablet Take 1 tablet (90 mg total) by mouth 2 (two) times daily. 60 tablet 5   No facility-administered medications prior to visit.    No Known Allergies     Objective:    BP 121/74   Pulse 83   Temp 98.1 F (36.7 C) (Oral)   Ht 5\' 11"  (1.803 m)   Wt 223 lb 9.6 oz (101.4 kg)   SpO2 100%   BMI 31.19 kg/m  Wt Readings from Last 3 Encounters:  07/05/22 223 lb 9.6 oz (101.4 kg)  06/24/22 223 lb 9.6 oz (101.4 kg)  06/18/22 223 lb (101.2 kg)    Physical Exam Vitals and nursing note reviewed.  Constitutional:      Appearance: He is well-developed.  HENT:     Head: Normocephalic and atraumatic.  Cardiovascular:     Rate and Rhythm: Normal rate and regular rhythm.     Heart sounds: Normal heart sounds. No murmur heard.    No friction rub. No gallop.  Pulmonary:     Effort: Pulmonary effort is normal. No tachypnea or respiratory distress.     Breath sounds: Normal breath sounds. No decreased breath sounds, wheezing, rhonchi or rales.  Chest:     Chest wall: No tenderness.  Abdominal:     General: Bowel sounds are normal.     Palpations: Abdomen is soft.  Musculoskeletal:        General: Normal range of motion.     Cervical back: Normal range of motion.  Skin:    General: Skin is warm and dry.  Neurological:     Mental Status: He is alert and oriented to person, place, and time.     Coordination: Coordination normal.  Psychiatric:        Behavior: Behavior normal. Behavior is cooperative.        Thought Content: Thought content normal.        Judgment: Judgment normal.          Patient has been counseled extensively about nutrition and exercise as well as the  importance of adherence with medications and regular follow-up. The patient was given clear instructions to go to ER or return to medical center if symptoms don't improve, worsen or new problems develop. The patient verbalized understanding.   Follow-up: Return for physical in october.   November, FNP-BC [facility] [place], [place]   07/05/2022, 8:11 PM

## 2022-07-15 ENCOUNTER — Other Ambulatory Visit (HOSPITAL_COMMUNITY): Payer: Self-pay

## 2022-07-15 ENCOUNTER — Other Ambulatory Visit (HOSPITAL_COMMUNITY): Payer: Self-pay | Admitting: Emergency Medicine

## 2022-07-15 NOTE — Progress Notes (Signed)
Paramedicine Encounter    Patient ID: Willie Campbell, male    DOB: Apr 11, 1971, 51 y.o.   MRN: [id number]   BP 120/84 (BP Location: Left Arm, Patient Position: Sitting, Cuff Size: Normal)   Pulse 72   Resp 16   Wt 219 lb (99.3 kg)   SpO2 96%   BMI 30.54 kg/m  Weight yesterday-not taken Last visit weight-223lb  ATF [name] A&O x 4, skin W&D w/ good color.  He denies chest pain or SOB today.   He does state that he has had a couple of episodes of SOB at rest but no chest pain or any other associated cardiac signs or symptoms.  I asked him to take notes when this happens to him again - what he's doing when it starts, how long it lasts and date & time.  Certainly he should call 911 at any time he feels the event is emergent.  Med box reconciled for two weeks.   Refills called in for ASA, Jardiance, Spironolactone, Atorvastatin and Carvedilol to [facility] and will be ready next week.  Home visit complete.    Beatrix Shipper, EMT-Paramedic [phone] 07/15/2022   Patient Care Team: Claiborne Rigg, NP as PCP - General (Nurse Practitioner) Lyn Records, MD as PCP - Cardiology (Cardiology)  Patient Active Problem List   Diagnosis Date Noted   Cardiac arrest [facility]) 02/26/2022   STEMI (ST elevation myocardial infarction) (HCC) 02/26/2022   Acute systolic heart failure (HCC)    Cardiogenic shock (HCC)     Current Outpatient Medications:    aspirin 81 MG chewable tablet, Chew 1 tablet (81 mg total) by mouth daily., Disp: 31 tablet, Rfl: 5   atorvastatin (LIPITOR) 40 MG tablet, Take 1 tablet (40 mg total) by mouth daily., Disp: 30 tablet, Rfl: 5   carvedilol (COREG) 3.125 MG tablet, Take 1 tablet (3.125 mg total) by mouth 2 (two) times daily with a meal., Disp: 62 tablet, Rfl: 5   empagliflozin (JARDIANCE) 10 MG TABS tablet, Take 1 tablet (10 mg total) by mouth daily., Disp: 30 tablet, Rfl: 5   nitroGLYCERIN (NITROSTAT) 0.4 MG SL tablet, Place 1 tablet (0.4 mg total) under the  tongue every 5 (five) minutes as needed for chest pain., Disp: 25 tablet, Rfl: 0   sacubitril-valsartan (ENTRESTO) 24-26 MG, Take 1 tablet by mouth 2 (two) times daily., Disp: 60 tablet, Rfl: 3   spironolactone (ALDACTONE) 25 MG tablet, Take 1 tablet (25 mg total) by mouth daily., Disp: 30 tablet, Rfl: 5   ticagrelor (BRILINTA) 90 MG TABS tablet, Take 1 tablet (90 mg total) by mouth 2 (two) times daily., Disp: 60 tablet, Rfl: 5 No Known Allergies    Social History   Socioeconomic History   Marital status: Single    Spouse name: Not on file   Number of children: Not on file   Years of education: Not on file   Highest education level: Not on file  Occupational History   Not on file  Tobacco Use   Smoking status: Former    Types: Cigarettes    Quit date: 02/26/2022    Years since quitting: 0.3   Smokeless tobacco: Never  Substance and Sexual Activity   Alcohol use: Not Currently    Comment: sometimes   Drug use: No   Sexual activity: Not on file  Other Topics Concern   Not on file  Social History Narrative   Not on file   Social Determinants of Health  Financial Resource Strain: High Risk (03/15/2022)   Overall Financial Resource Strain (CARDIA)    Difficulty of Paying Living Expenses: Hard  Food Insecurity: No Food Insecurity (03/15/2022)   Hunger Vital Sign    Worried About Running Out of Food in the Last Year: Never true    Ran Out of Food in the Last Year: Never true  Transportation Needs: No Transportation Needs (03/15/2022)   PRAPARE - Administrator, Civil Service (Medical): No    Lack of Transportation (Non-Medical): No  Physical Activity: Not on file  Stress: Not on file  Social Connections: Not on file  Intimate Partner Violence: Not on file    Physical Exam      Future Appointments  Date Time Provider Department Center  08/05/2022  1:00 PM [facility] ECHO OP 1 [facility]  08/05/2022  2:00 PM Bensimhon, Bevelyn Buckles, MD [facility] None  09/06/2022  2:50  PM Claiborne Rigg, NP [facility] None       Beatrix Shipper, EMT-Paramedic [phone] [facility] Paramedic  07/15/22

## 2022-07-26 ENCOUNTER — Other Ambulatory Visit (HOSPITAL_COMMUNITY): Payer: Self-pay

## 2022-07-28 ENCOUNTER — Telehealth (HOSPITAL_COMMUNITY): Payer: Self-pay | Admitting: Licensed Clinical Social Worker

## 2022-07-28 NOTE — Telephone Encounter (Signed)
HF Paramedicine Team Based Care Meeting  HF MD- NA  HF NP - Amy Clegg NP-C   [facility] HF Paramedicine  Katie Vicente Males  [facility] admit within the last 30 days for heart failure?   Medications concerns? Seems to be compliant- will start teaching him to do his own pill box in prep for DC  Eligible for discharge? Think he is getting close- has some social concerns up in the air that are concerning for DC at this time.  Burna Sis, LCSW Clinical Social Worker [facility] Desk#: [phone] Cell#: [phone]

## 2022-07-29 ENCOUNTER — Other Ambulatory Visit (HOSPITAL_COMMUNITY): Payer: Self-pay | Admitting: Emergency Medicine

## 2022-07-29 NOTE — Progress Notes (Signed)
Had appointment scheduled for today @ 2:00.  Called repeatedly with no answer and left voicemail and sent text.  Drove by his apartment and rang the doorbell with no answer.  It is not like him to be unreachable.  I will reach out to him on Tuesday as I am out of the office tomorrow.

## 2022-08-03 ENCOUNTER — Other Ambulatory Visit (HOSPITAL_COMMUNITY): Payer: Self-pay

## 2022-08-04 ENCOUNTER — Telehealth (HOSPITAL_COMMUNITY): Payer: Self-pay | Admitting: Emergency Medicine

## 2022-08-04 NOTE — Telephone Encounter (Signed)
Mr. Fier finally returned my call.  I've called him multiple times the last few days with no answer and LVM.   When we finally spoke today I reminded him of his ECHO scheduled @ [facility] @ 1:00 and 2:00 appt w/ Dr. Haroldine Laws.  I requested he bring his meds and pill box for reconciliation and he agreed to same.

## 2022-08-05 ENCOUNTER — Ambulatory Visit (HOSPITAL_COMMUNITY)
Admission: RE | Admit: 2022-08-05 | Discharge: 2022-08-05 | Disposition: A | Discharge status: Home / Self Care | Payer: Self-pay | Source: Ambulatory Visit | Attending: Internal Medicine | Admitting: Internal Medicine

## 2022-08-05 ENCOUNTER — Ambulatory Visit (HOSPITAL_BASED_OUTPATIENT_CLINIC_OR_DEPARTMENT_OTHER)
Admission: RE | Admit: 2022-08-05 | Discharge: 2022-08-05 | Disposition: A | Discharge status: Home / Self Care | Payer: Self-pay | Source: Ambulatory Visit | Attending: Internal Medicine | Admitting: Internal Medicine

## 2022-08-05 ENCOUNTER — Other Ambulatory Visit (HOSPITAL_COMMUNITY): Payer: Self-pay | Admitting: Emergency Medicine

## 2022-08-05 ENCOUNTER — Encounter (HOSPITAL_COMMUNITY): Payer: Self-pay | Admitting: Internal Medicine

## 2022-08-05 VITALS — BP 110/70 | HR 81 | Wt 211.6 lb

## 2022-08-05 DIAGNOSIS — Z7902 Long term (current) use of antithrombotics/antiplatelets: Secondary | ICD-10-CM | POA: Insufficient documentation

## 2022-08-05 DIAGNOSIS — I5022 Chronic systolic (congestive) heart failure: Secondary | ICD-10-CM | POA: Insufficient documentation

## 2022-08-05 DIAGNOSIS — I252 Old myocardial infarction: Secondary | ICD-10-CM | POA: Insufficient documentation

## 2022-08-05 DIAGNOSIS — Z720 Tobacco use: Secondary | ICD-10-CM

## 2022-08-05 DIAGNOSIS — F17200 Nicotine dependence, unspecified, uncomplicated: Secondary | ICD-10-CM | POA: Insufficient documentation

## 2022-08-05 DIAGNOSIS — Z8674 Personal history of sudden cardiac arrest: Secondary | ICD-10-CM | POA: Insufficient documentation

## 2022-08-05 DIAGNOSIS — Z7982 Long term (current) use of aspirin: Secondary | ICD-10-CM | POA: Insufficient documentation

## 2022-08-05 DIAGNOSIS — Z79899 Other long term (current) drug therapy: Secondary | ICD-10-CM | POA: Insufficient documentation

## 2022-08-05 DIAGNOSIS — Z955 Presence of coronary angioplasty implant and graft: Secondary | ICD-10-CM | POA: Insufficient documentation

## 2022-08-05 DIAGNOSIS — I2582 Chronic total occlusion of coronary artery: Secondary | ICD-10-CM | POA: Insufficient documentation

## 2022-08-05 DIAGNOSIS — I2510 Atherosclerotic heart disease of native coronary artery without angina pectoris: Secondary | ICD-10-CM | POA: Insufficient documentation

## 2022-08-05 DIAGNOSIS — Z7984 Long term (current) use of oral hypoglycemic drugs: Secondary | ICD-10-CM | POA: Insufficient documentation

## 2022-08-05 DIAGNOSIS — K7200 Acute and subacute hepatic failure without coma: Secondary | ICD-10-CM | POA: Insufficient documentation

## 2022-08-05 DIAGNOSIS — Z556 Problems related to health literacy: Secondary | ICD-10-CM | POA: Insufficient documentation

## 2022-08-05 LAB — ECHOCARDIOGRAM COMPLETE
Area-P 1/2: 1.96 cm2
S' Lateral: 3.5 cm

## 2022-08-05 LAB — BASIC METABOLIC PANEL
Anion gap: 3 — ABNORMAL LOW (ref 5–15)
BUN: 8 mg/dL (ref 6–20)
CO2: 23 mmol/L (ref 22–32)
Calcium: 9.5 mg/dL (ref 8.9–10.3)
Chloride: 110 mmol/L (ref 98–111)
Creatinine, Ser: 1.1 mg/dL (ref 0.61–1.24)
GFR, Estimated: >60 mL/min (ref >60)
Glucose, Bld: 103 mg/dL — ABNORMAL HIGH (ref 70–99)
Potassium: 4 mmol/L (ref 3.5–5.1)
Sodium: 136 mmol/L (ref 135–145)

## 2022-08-05 LAB — BRAIN NATRIURETIC PEPTIDE: B Natriuretic Peptide: 50.2 pg/mL (ref 0.0–100.0)

## 2022-08-05 NOTE — Progress Notes (Signed)
  Echocardiogram 2D Echocardiogram has been performed.  Darlina Sicilian M 08/05/2022, 1:31 PM

## 2022-08-05 NOTE — Progress Notes (Signed)
Advanced Heart Failure Clinic Note   PCP: Establishing with [facility] HF Cardiologist: Dr. Haroldine Laws  HPI: Willie Campbell is a 51 y.o.male with GERD, tobacco abuse, and newly diagnosed systolic heart failure and CAD.  Admitted 04/23 with anterolateral STEMI. In ambulance developed VT/VF. Multiple shocks in ambulance with CPR. On arrival to ER at least 2:1 block HR in 30s with ST elevation in ant lat leads.  He then developed VT and V fib and was shocked.  Intubated emergently and brought to cath lab. R/LHC showed totally occluded LAD with EF 20-25%, s/p PCI/DES to LAD. Started on EPI and shock team called. RHC on NE 2 showed elevated right filling and PA pressures, CO/CI (Fick) 5.61/2.51 OK on NE support. Started on milrinone and diuresed with IV lasix. Drips eventually weaned off with stable CO-OX. GDMT started. No arrhythmias post PCI. Repeat echo EF ~ 40%.  Treated abx for possible aspiration PNA vs pneumonitis. Course c/b AKI and shock liver which improved prior to discharge.   Post hospital follow up 4/23, stable NYHA II symptoms, losartan switched to Entresto.  Today he returns for HF follow up with paramedic, De[place]. Overall feeling fine. Mind seems to race at times and he can't sleep, then feels like he has "SOB and chest pain". Resolves "CP and SOB" by going for walks, seems to be anxiety related. SOB minimal, no SOB with ambulation. 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 Has been smoking more recently, about 1/2 pack a day. Minimal ETOH use.    Cardiac Studies: - Echo today: EF 50-55% - Echo, post PCI (4/23): EF 40%  - R/LHC (4/23): on NE 2, PCI to LAD Ao 119/87 (104) LV 157/37 RA 17 PA 45/25 (34)  PCWP 28 Fick 5.61/2.51 SVR 1,240 CPO 1.2 PAPi 1.2  - Echo (4/23): EF 20-25%   ROS: All systems negative except as listed in HPI, PMH and Problem List.  SH:  Social History   Socioeconomic History   Marital status: Single    Spouse name: Not on file   Number of  children: Not on file   Years of education: Not on file   Highest education level: Not on file  Occupational History   Not on file  Tobacco Use   Smoking status: Former    Types: Cigarettes    Quit date: 02/26/2022    Years since quitting: 0.4   Smokeless tobacco: Never  Substance and Sexual Activity   Alcohol use: Not Currently    Comment: sometimes   Drug use: No   Sexual activity: Not on file  Other Topics Concern   Not on file  Social History Narrative   Not on file   Social Determinants of Health   Financial Resource Strain: High Risk (03/15/2022)   Overall Financial Resource Strain (CARDIA)    Difficulty of Paying Living Expenses: Hard  Food Insecurity: No Food Insecurity (03/15/2022)   Hunger Vital Sign    Worried About Running Out of Food in the Last Year: Never true    Ran Out of Food in the Last Year: Never true  Transportation Needs: No Transportation Needs (03/15/2022)   PRAPARE - Hydrologist (Medical): No    Lack of Transportation (Non-Medical): No  Physical Activity: Not on file  Stress: Not on file  Social Connections: Not on file  Intimate Partner Violence: Not on file   FH: History reviewed. No pertinent family history.  Past Medical History:  Diagnosis Date  CAD (coronary artery disease)    Cardiac arrest ([place]) 02/26/2022   CHF (congestive heart failure) (HCC)    STEMI (ST elevation myocardial infarction) ([place]) 02/26/2022   Current Outpatient Medications  Medication Sig Dispense Refill   aspirin 81 MG chewable tablet Chew 1 tablet (81 mg total) by mouth daily. 31 tablet 5   atorvastatin (LIPITOR) 40 MG tablet Take 1 tablet (40 mg total) by mouth daily. 30 tablet 5   carvedilol (COREG) 3.125 MG tablet Take 1 tablet (3.125 mg total) by mouth 2 (two) times daily with a meal. 62 tablet 5   empagliflozin (JARDIANCE) 10 MG TABS tablet Take 1 tablet (10 mg total) by mouth daily. 30 tablet 5   nitroGLYCERIN (NITROSTAT) 0.4 MG SL  tablet Place 1 tablet (0.4 mg total) under the tongue every 5 (five) minutes as needed for chest pain. 25 tablet 0   sacubitril-valsartan (ENTRESTO) 24-26 MG Take 1 tablet by mouth 2 (two) times daily. 60 tablet 3   spironolactone (ALDACTONE) 25 MG tablet Take 1 tablet (25 mg total) by mouth daily. 30 tablet 5   ticagrelor (BRILINTA) 90 MG TABS tablet Take 1 tablet (90 mg total) by mouth 2 (two) times daily. 60 tablet 5   No current facility-administered medications for this encounter.   BP 110/70   Pulse 81   Wt 96 kg (211 lb 9.6 oz)   SpO2 98%   BMI 29.51 kg/m   Wt Readings from Last 3 Encounters:  08/05/22 96 kg (211 lb 9.6 oz)  07/15/22 99.3 kg (219 lb)  07/05/22 101.4 kg (223 lb 9.6 oz)   PHYSICAL EXAM: General:  well appearing. No respiratory difficulty HEENT: normal Neck: supple. No JVD. Carotids 2+ bilat; no bruits. No lymphadenopathy or thyromegaly appreciated. Cor: PMI nondisplaced. Regular rate & rhythm. No rubs, gallops or murmurs. Lungs: clear Abdomen: soft, nontender, nondistended. No hepatosplenomegaly. No bruits or masses. Good bowel sounds. Extremities: no cyanosis, clubbing, rash, edema  Neuro: alert & oriented x 3, cranial nerves grossly intact. moves all 4 extremities w/o difficulty. Affect pleasant.   ASSESSMENT & PLAN:   1. CAD  - Cath 4/23: LAD 100% occluded -> PCI/DES, c/b VT/VF arrest. - DAPT with aspirin and ticagrelor.  - No chest pain. - Continue atorvastatin 40 mg daily. - Continue beta blocker. - Lipids stable, LDL 63  2. Chronic systolic HF  - cardiogenic shock 04/23 in setting of acute MI. - Echo (4/23): EF ~40%, multiple RWMA consistent with LAD infarct, RV okay. - NYHA II, volume looks stable. He does not need daily loops. - Echo today improved EF 50-55% - No BP room to increase GDMT today. - Continue carvedilol 3.125 mg bid - Continue Entresto 24/26 mg bid. - Continue spiro 25 mg daily. - Continue Jardiance 10 mg daily   3. H/o  AKI - Resolved. - last Cr 6/23 stable 1.16  4. H/o shock liver - Resolved.    5. Tobacco abuse - Previously quit, has gradually been smoking more, about 1/2 pack recently. Discussed patches/gum. - Discussed complete cessation.  6. SDOH:  - No insurance. He is unsure about status of his medicaid application. HFSW helping. - Poor health literacy. Continue paramedicine, appreciate their assistance.   Earnie Larsson, AGACNP-BC  08/05/22  Patient seen and examined with the above-signed Advanced Practice Provider and/or Housestaff. I personally reviewed laboratory data, imaging studies and relevant notes. I independently examined the patient and formulated the important aspects of the plan. I have edited the  note to reflect any of my changes or salient points. I have personally discussed the plan with the patient and/or family.  Doing well. NYHA II. Volume status looks ok. Back to smoking some. Still relying on paramedicine team for help with his meds.  Echo today EF 50-55% Personally reviewed  General:  Well appearing. No resp difficulty HEENT: normal Neck: supple. no JVD. Carotids 2+ bilat; no bruits. No lymphadenopathy or thryomegaly appreciated. Cor: PMI nondisplaced. Regular rate & rhythm. No rubs, gallops or murmurs. Lungs: clear Abdomen: soft, nontender, nondistended. No hepatosplenomegaly. No bruits or masses. Good bowel sounds. Extremities: no cyanosis, clubbing, rash, edema Neuro: alert & orientedx3, cranial nerves grossly intact. moves all 4 extremities w/o difficulty. Affect pleasant  Stable NYHA II. Echo today with EF recovery. On good GDMT. Check labs. Discussed need for smoking cessation and need to develop independence with medication administration.   Glori Bickers, MD  3:10 PM

## 2022-08-05 NOTE — Progress Notes (Addendum)
Paramedicine Encounter   Patient ID: Willie Campbell , male,   DOB: August 08, 1971,51 y.o.,  MRN: [id number]   Time spent with patient 42min.  ATF [name] in clinic post ECHO.  Appt w/ Dr. Haroldine Laws.  He was advised his heart function has returned to 50-55%.  Dr. Haroldine Laws pleased with [name] improved heart function/recovery.  Discussed the importance of compliance with meds to maintain his progress.  He was advised that he could resume most normal activities as tolerated listening to his body to rest should he have SOB or CP.  Pt has been compliant with medications.  No med changes at this time.  Med box reconciled in the clinic.  Advised pt. 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   I filled out [employer]  PAP form from Mr. Staples had him sign it and dropped it off in HF Triage for [employer].  Paperwork left with Jasmine.  [name], [place] 08/05/2022

## 2022-08-05 NOTE — Patient Instructions (Signed)
Good to see you today!  No medication changes were made  Lab work  done today we will call with any abnormal results   Follow up in 6 months(March 2024) call in January to schedule an appointment  If you have any questions or concerns before your next appointment please send [place] a message through [place] or call our office at [phone].    TO LEAVE A MESSAGE FOR THE NURSE SELECT OPTION 2, PLEASE LEAVE A MESSAGE INCLUDING: YOUR NAME DATE OF BIRTH CALL BACK NUMBER REASON FOR CALL**this is important as we prioritize the call backs  YOU WILL RECEIVE A CALL BACK THE SAME DAY AS LONG AS YOU CALL BEFORE 4:00 PM  At the [facility], you and your health needs are our priority. As part of our continuing mission to provide you with exceptional heart care, we have created designated Provider Care Teams. These Care Teams include your primary Cardiologist (physician) and Advanced Practice Providers (APPs- Physician Assistants and Nurse Practitioners) who all work together to provide you with the care you need, when you need it.   You may see any of the following providers on your designated Care Team at your next follow up: Dr Glori Bickers Dr Loralie Champagne Dr. Roxana Hires, NP Lyda Jester, [facility] [place], [place] Forestine Na, NP Audry Riles, PharmD   Please be sure to bring in all your medications bottles to every appointment.

## 2022-08-06 ENCOUNTER — Other Ambulatory Visit (HOSPITAL_COMMUNITY): Payer: Self-pay

## 2022-08-13 ENCOUNTER — Telehealth (HOSPITAL_COMMUNITY): Payer: Self-pay | Admitting: Pharmacy Technician

## 2022-08-13 NOTE — Telephone Encounter (Signed)
Advanced Heart Failure Patient Advocate Encounter  Patient is currently uninsured. Sent in [employer] application for Jardiance assistance 09/28. Document scanned to chart.

## 2022-08-17 ENCOUNTER — Other Ambulatory Visit (HOSPITAL_COMMUNITY): Payer: Self-pay

## 2022-08-19 ENCOUNTER — Other Ambulatory Visit (HOSPITAL_COMMUNITY): Payer: Self-pay

## 2022-08-19 ENCOUNTER — Telehealth (HOSPITAL_COMMUNITY): Payer: Self-pay | Admitting: Emergency Medicine

## 2022-08-19 NOTE — Telephone Encounter (Signed)
Called and spoke with Willie Campbell and scheduled home visit for 12:00 tomorrow 08/20/22    [name], [place] 08/19/2022

## 2022-08-20 ENCOUNTER — Other Ambulatory Visit (HOSPITAL_COMMUNITY): Payer: Self-pay | Admitting: Emergency Medicine

## 2022-08-20 ENCOUNTER — Other Ambulatory Visit (HOSPITAL_COMMUNITY): Payer: Self-pay

## 2022-08-20 NOTE — Progress Notes (Signed)
Paramedicine Encounter    Patient ID: Willie Campbell, male    DOB: 06/18/1971, 51 y.o.   MRN: [id number]   There were no vitals taken for this visit. Weight yesterday-not taken Last visit weight-219lb  ATF Mr. Mcwhirter A&O x 4, skin W&D w/ good color.  Pt denies chest pain or SOB.  Lung sounds are clear and equal bilat.  No edema noted.  Pt has been compliant with all medications.  Today he filled his own pill box as I supervised.  He is out of his Carvedilol and will pick it up at [facility].   Home visit complete.    [name], [place] 08/20/2022  Patient Care Team: Gildardo Pounds, NP as PCP - General (Nurse Practitioner) Belva Crome, MD as PCP - Cardiology (Cardiology)  Patient Active Problem List   Diagnosis Date Noted  . Cardiac arrest ([place]) 02/26/2022  . STEMI (ST elevation myocardial infarction) ([place]) 02/26/2022  . Acute systolic heart failure ([place])   . Cardiogenic shock (HCC)     Current Outpatient Medications:  .  aspirin 81 MG chewable tablet, Chew 1 tablet (81 mg total) by mouth daily., Disp: 31 tablet, Rfl: 5 .  atorvastatin (LIPITOR) 40 MG tablet, Take 1 tablet (40 mg total) by mouth daily., Disp: 30 tablet, Rfl: 5 .  carvedilol (COREG) 3.125 MG tablet, Take 1 tablet (3.125 mg total) by mouth 2 (two) times daily with a meal., Disp: 62 tablet, Rfl: 5 .  empagliflozin (JARDIANCE) 10 MG TABS tablet, Take 1 tablet (10 mg total) by mouth daily., Disp: 30 tablet, Rfl: 5 .  nitroGLYCERIN (NITROSTAT) 0.4 MG SL tablet, Place 1 tablet (0.4 mg total) under the tongue every 5 (five) minutes as needed for chest pain., Disp: 25 tablet, Rfl: 0 .  sacubitril-valsartan (ENTRESTO) 24-26 MG, Take 1 tablet by mouth 2 (two) times daily., Disp: 60 tablet, Rfl: 3 .  spironolactone (ALDACTONE) 25 MG tablet, Take 1 tablet (25 mg total) by mouth daily., Disp: 30 tablet, Rfl: 5 .  ticagrelor (BRILINTA) 90 MG TABS tablet, Take 1 tablet (90 mg total) by mouth 2  (two) times daily., Disp: 60 tablet, Rfl: 5 Allergies  Allergen Reactions  . Bee Venom Hives      Social History   Socioeconomic History  . Marital status: Single    Spouse name: Not on file  . Number of children: Not on file  . Years of education: Not on file  . Highest education level: Not on file  Occupational History  . Not on file  Tobacco Use  . Smoking status: Some Days    Packs/day: 0.50    Types: Cigarettes    Last attempt to quit: 02/26/2022    Years since quitting: 0.4  . Smokeless tobacco: Never  Substance and Sexual Activity  . Alcohol use: Not Currently    Comment: sometimes  . Drug use: No  . Sexual activity: Not on file  Other Topics Concern  . Not on file  Social History Narrative  . Not on file   Social Determinants of Health   Financial Resource Strain: High Risk (03/15/2022)   Overall Financial Resource Strain (CARDIA)   . Difficulty of Paying Living Expenses: Hard  Food Insecurity: No Food Insecurity (03/15/2022)   Hunger Vital Sign   . Worried About Charity fundraiser in the Last Year: Never true   . Ran Out of Food in the Last Year: Never true  Transportation Needs: No  Transportation Needs (03/15/2022)   PRAPARE - Transportation   . Lack of Transportation (Medical): No   . Lack of Transportation (Non-Medical): No  Physical Activity: Not on file  Stress: Not on file  Social Connections: Not on file  Intimate Partner Violence: Not on file    Physical Exam      Future Appointments  Date Time Provider Department Center  09/06/2022  2:50 PM Claiborne Rigg, NP [facility] None       Beatrix Shipper, EMT-Paramedic [phone] [facility] Paramedic  08/20/22

## 2022-09-06 ENCOUNTER — Encounter: Payer: Self-pay | Admitting: Nurse Practitioner

## 2022-09-06 ENCOUNTER — Ambulatory Visit: Payer: Commercial Managed Care - HMO | Attending: Nurse Practitioner | Admitting: Nurse Practitioner

## 2022-09-06 ENCOUNTER — Other Ambulatory Visit (HOSPITAL_COMMUNITY): Payer: Self-pay

## 2022-09-06 VITALS — BP 119/72 | HR 83 | Temp 98.1°F | Ht 71.0 in | Wt 216.6 lb

## 2022-09-06 DIAGNOSIS — Z1211 Encounter for screening for malignant neoplasm of colon: Secondary | ICD-10-CM

## 2022-09-06 DIAGNOSIS — Z0000 Encounter for general adult medical examination without abnormal findings: Secondary | ICD-10-CM

## 2022-09-06 NOTE — Progress Notes (Signed)
Assessment & Plan:  [name] was seen today for annual exam.  Diagnoses and all orders for this visit:  Encounter for annual physical exam  Colon cancer screening -     Fecal occult blood, imunochemical([facility])    Patient has been counseled on age-appropriate routine health concerns for screening and prevention. These are reviewed and up-to-date. Referrals have been placed accordingly. Immunizations are up-to-date or declined.    Subjective:   Chief Complaint  Patient presents with   Annual Exam   HPI Willie Campbell 51 y.o. male presents to office today for annual physical.    He has a past medical history of CAD, Cardiac arrest (02/26/2022), CHF, and STEMI (02/26/2022).    Started smoking at the age of 61 (34 years ago) and currently smokes about 3 cigarettes a day.    Admitted 4-14-202 for 3 days with VF arrest, Acute anterolateral STEMI, cardiogenic shock/cardiac arrest, acute systolic CHF, acute hypoxic resp failure, shock liver, AKI. Required CPR and multiple shocks in ambulance.  Required pressors, IV lasix and PCI/DES for totally occluded LAD with EF 20-25%. Extubated 4-15. Treated with abx for possible aspiration PNA vs pneumonitis. Most recent Echo with EF 50-55% post procedure Discharged home on ASA and brillinta (can switch to plavix if cost an issue), low dose coreg, atorvastatin, entresto, spironolactone, jardiance    Since being discharged he has been evaluated OP by cardiology and the outreach HF paramedicine program. Endorses shortness of breath with exertion. Blood pressure is well controlled.  Taking all medications as prescribed.  Doing well today. 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 Not sure if he is well suited for this type of work and I did advise him that he may or may not be able to work in this type of field depending on his job duties.  BP Readings from Last 3 Encounters:  09/06/22 119/72  08/20/22 118/78  08/05/22 110/70     Review  of Systems  Constitutional:  Negative for fever, malaise/fatigue and weight loss.  HENT: Negative.  Negative for nosebleeds.   Eyes: Negative.  Negative for blurred vision, double vision and photophobia.  Respiratory: Negative.  Negative for cough and shortness of breath.   Cardiovascular: Negative.  Negative for chest pain, palpitations and leg swelling.  Gastrointestinal: Negative.  Negative for heartburn, nausea and vomiting.  Genitourinary: Negative.   Musculoskeletal: Negative.  Negative for myalgias.  Skin: Negative.   Neurological: Negative.  Negative for dizziness, focal weakness, seizures and headaches.  Endo/Heme/Allergies: Negative.   Psychiatric/Behavioral: Negative.  Negative for suicidal ideas.     Past Medical History:  Diagnosis Date   CAD (coronary artery disease)    Cardiac arrest (HCC) 02/26/2022   CHF (congestive heart failure) (HCC)    STEMI (ST elevation myocardial infarction) (HCC) 02/26/2022    Past Surgical History:  Procedure Laterality Date   CORONARY STENT INTERVENTION N/A 02/26/2022   Procedure: CORONARY STENT INTERVENTION;  Surgeon: Lyn Records, MD;  Location: [facility] IN[facility];  Service: Cardiovascular;  Laterality: N/A;   HERNIA REPAIR     RIGHT/LEFT HEART CATH AND CORONARY ANGIOGRAPHY N/A 02/26/2022   Procedure: RIGHT/LEFT HEART CATH AND CORONARY ANGIOGRAPHY;  Surgeon: Lyn Records, MD;  Location: [facility] INVASIVE CV LAB;  Service: Cardiovascular;  Laterality: N/A;    No family history on file.  Social History Reviewed with no changes to be made today.   Outpatient Medications Prior to Visit  Medication Sig Dispense Refill   aspirin 81  MG chewable tablet Chew 1 tablet (81 mg total) by mouth daily. 31 tablet 5   atorvastatin (LIPITOR) 40 MG tablet Take 1 tablet (40 mg total) by mouth daily. 30 tablet 5   carvedilol (COREG) 3.125 MG tablet Take 1 tablet (3.125 mg total) by mouth 2 (two) times daily with a meal. 62 tablet 5   empagliflozin  (JARDIANCE) 10 MG TABS tablet Take 1 tablet (10 mg total) by mouth daily. 30 tablet 5   nitroGLYCERIN (NITROSTAT) 0.4 MG SL tablet Place 1 tablet (0.4 mg total) under the tongue every 5 (five) minutes as needed for chest pain. 25 tablet 0   sacubitril-valsartan (ENTRESTO) 24-26 MG Take 1 tablet by mouth 2 (two) times daily. 60 tablet 3   spironolactone (ALDACTONE) 25 MG tablet Take 1 tablet (25 mg total) by mouth daily. 30 tablet 5   ticagrelor (BRILINTA) 90 MG TABS tablet Take 1 tablet (90 mg total) by mouth 2 (two) times daily. 60 tablet 5   No facility-administered medications prior to visit.    Allergies  Allergen Reactions   Bee Venom Hives       Objective:    BP 119/72   Pulse 83   Temp 98.1 F (36.7 C) (Temporal)   Ht 5\' 11"  (1.803 m)   Wt 216 lb 9.6 oz (98.2 kg)   SpO2 97%   BMI 30.21 kg/m  Wt Readings from Last 3 Encounters:  09/06/22 216 lb 9.6 oz (98.2 kg)  08/20/22 219 lb (99.3 kg)  08/05/22 211 lb 9.6 oz (96 kg)    Physical Exam Constitutional:      Appearance: He is well-developed.  HENT:     Head: Normocephalic and atraumatic.     Right Ear: Hearing, tympanic membrane, ear canal and external ear normal.     Left Ear: Hearing, tympanic membrane, ear canal and external ear normal.     Nose: Nose normal. No mucosal edema or rhinorrhea.     Right Turbinates: Not enlarged.     Left Turbinates: Not enlarged.     Mouth/Throat:     Lips: Pink.     Mouth: Mucous membranes are moist.     Dentition: No gingival swelling, dental abscesses or gum lesions.     Pharynx: Uvula midline.     Tonsils: No tonsillar exudate. 1+ on the right. 1+ on the left.  Eyes:     General: Lids are normal. No scleral icterus.    Extraocular Movements: Extraocular movements intact.     Conjunctiva/sclera: Conjunctivae normal.     Pupils: Pupils are equal, round, and reactive to light.  Neck:     Thyroid: No thyromegaly.     Trachea: No tracheal deviation.  Cardiovascular:      Rate and Rhythm: Normal rate and regular rhythm.     Heart sounds: Normal heart sounds. No murmur heard.    No friction rub. No gallop.  Pulmonary:     Effort: Pulmonary effort is normal. No respiratory distress.     Breath sounds: Normal breath sounds. No wheezing or rales.  Chest:     Chest wall: No mass or tenderness.  Breasts:    Right: No inverted nipple, mass, nipple discharge, skin change or tenderness.     Left: No inverted nipple, mass, nipple discharge, skin change or tenderness.  Abdominal:     General: Bowel sounds are normal. There is no distension.     Palpations: Abdomen is soft. There is no mass.  Tenderness: There is no abdominal tenderness. There is no guarding or rebound.  Musculoskeletal:        General: No tenderness or deformity. Normal range of motion.     Cervical back: Normal range of motion and neck supple.  Lymphadenopathy:     Cervical: No cervical adenopathy.  Skin:    General: Skin is warm and dry.     Capillary Refill: Capillary refill takes less than 2 seconds.     Findings: No erythema.  Neurological:     Mental Status: He is alert and oriented to person, place, and time.     Cranial Nerves: No cranial nerve deficit.     Sensory: Sensation is intact.     Motor: No abnormal muscle tone.     Coordination: Coordination is intact. Coordination normal.     Gait: Gait is intact.     Deep Tendon Reflexes: Reflexes normal.     Reflex Scores:      Patellar reflexes are 1+ on the right side and 1+ on the left side. Psychiatric:        Attention and Perception: Attention normal.        Mood and Affect: Mood normal.        Speech: Speech normal.        Behavior: Behavior normal.        Thought Content: Thought content normal.        Judgment: Judgment normal.          Patient has been counseled extensively about nutrition and exercise as well as the importance of adherence with medications and regular follow-up. The patient was given clear  instructions to go to ER or return to medical center if symptoms don't improve, worsen or new problems develop. The patient verbalized understanding.   Follow-up: Return in about 3 months (around 12/07/2022).   Gildardo Pounds, FNP-BC [facility] and [place], [place]   09/06/2022, 8:07 PM

## 2022-09-09 NOTE — Telephone Encounter (Signed)
Advanced Heart Failure Patient Advocate Encounter  Contacted [employer] for update. Patient has been approved effective 08/13/2022 to 08/13/2023. First delivery was made to patient on 08/20/22.  Clista Bernhardt, CPhT Rx Patient Advocate Phone: [phone]

## 2022-09-24 ENCOUNTER — Telehealth (HOSPITAL_COMMUNITY): Payer: Self-pay | Admitting: Emergency Medicine

## 2022-09-24 ENCOUNTER — Other Ambulatory Visit (HOSPITAL_COMMUNITY): Payer: Self-pay

## 2022-09-24 NOTE — Telephone Encounter (Signed)
Mr. Scott called me back for 11/7 message and stated he has a new job and has been working.  We scheduled a visit for 11/14 @ 1:30.     Beatrix Shipper, EMT-Paramedic [phone] 09/24/2022

## 2022-09-24 NOTE — Telephone Encounter (Signed)
Attempted to reach Mr. Luchsinger to schedule an appointment.  Left voicemail for him to call me back.     Beatrix Shipper, EMT-Paramedic [phone] 09/24/2022

## 2022-09-28 ENCOUNTER — Other Ambulatory Visit (HOSPITAL_COMMUNITY): Payer: Self-pay | Admitting: Emergency Medicine

## 2022-09-28 ENCOUNTER — Other Ambulatory Visit (HOSPITAL_COMMUNITY): Payer: Self-pay

## 2022-09-28 NOTE — Progress Notes (Signed)
Paramedicine Encounter    Patient ID: Willie Campbell, male    DOB: 1971/02/03, 51 y.o.   MRN: [id number]   BP (!) 140/100 (BP Location: Right Arm, Patient Position: Standing, Cuff Size: Normal)   Pulse 83   SpO2 98%  Weight yesterday-not taken Last visit weight-not taken  Met with Willie Campbell at [place] as he is now living in [place] with is sister and her husband.  He has started a new job at [employer] working 3rd shift.  He forgot and left his pill box at home.  He needs refills on his meds.  He get his Writer by mail.  HF fund is no longer covering Jardiance and I am filling out his financial aid paperwork for him to sign on Friday.  His refills on his Atorvastatin, Carvedilol and Spironolactone will be ready to pick up this afternoon and he said he would do so.  I am going to meet him on Friday at the pharmacy to get signatures on his Jardiance paperwork and get that turned in.  Visit complete.    Willie Campbell, [place] 09/28/2022    Patient Care Team: Willie Pounds, NP as PCP - General (Nurse Practitioner) Willie Crome, MD as PCP - Cardiology (Cardiology)  Patient Active Problem List   Diagnosis Date Noted   Cardiac arrest [facility]) 02/26/2022   STEMI (ST elevation myocardial infarction) ([place]) 40/08/2724   Acute systolic heart failure ([place])    Cardiogenic shock (HCC)     Current Outpatient Medications:    aspirin 81 MG chewable tablet, Chew 1 tablet (81 mg total) by mouth daily., Disp: 31 tablet, Rfl: 5   atorvastatin (LIPITOR) 40 MG tablet, Take 1 tablet (40 mg total) by mouth daily., Disp: 30 tablet, Rfl: 5   carvedilol (COREG) 3.125 MG tablet, Take 1 tablet (3.125 mg total) by mouth 2 (two) times daily with a meal., Disp: 62 tablet, Rfl: 5   empagliflozin (JARDIANCE) 10 MG TABS tablet, Take 1 tablet (10 mg total) by mouth daily., Disp: 30 tablet, Rfl: 5   nitroGLYCERIN (NITROSTAT) 0.4 MG SL  tablet, Place 1 tablet (0.4 mg total) under the tongue every 5 (five) minutes as needed for chest pain., Disp: 25 tablet, Rfl: 0   sacubitril-valsartan (ENTRESTO) 24-26 MG, Take 1 tablet by mouth 2 (two) times daily., Disp: 60 tablet, Rfl: 3   spironolactone (ALDACTONE) 25 MG tablet, Take 1 tablet (25 mg total) by mouth daily., Disp: 30 tablet, Rfl: 5   ticagrelor (BRILINTA) 90 MG TABS tablet, Take 1 tablet (90 mg total) by mouth 2 (two) times daily., Disp: 60 tablet, Rfl: 5 Allergies  Allergen Reactions   Bee Venom Hives      Social History   Socioeconomic History   Marital status: Single    Spouse name: Not on file   Number of children: Not on file   Years of education: Not on file   Highest education level: Not on file  Occupational History   Not on file  Tobacco Use   Smoking status: Some Days    Packs/day: 0.50    Types: Cigarettes    Last attempt to quit: 02/26/2022    Years since quitting: 0.5   Smokeless tobacco: Never  Substance and Sexual Activity   Alcohol use: Not Currently    Comment: sometimes   Drug use: No   Sexual activity: Not on file  Other Topics Concern   Not on file  Social History Narrative   Not on file   Social Determinants of Health   Financial Resource Strain: High Risk (03/15/2022)   Overall Financial Resource Strain (CARDIA)    Difficulty of Paying Living Expenses: Hard  Food Insecurity: No Food Insecurity (03/15/2022)   Hunger Vital Sign    Worried About Running Out of Food in the Last Year: Never true    Ran Out of Food in the Last Year: Never true  Transportation Needs: No Transportation Needs (03/15/2022)   PRAPARE - Hydrologist (Medical): No    Lack of Transportation (Non-Medical): No  Physical Activity: Not on file  Stress: Not on file  Social Connections: Not on file  Intimate Partner Violence: Not on file    Physical Exam      Future Appointments  Date Time Provider [place]  12/07/2022   2:10 PM Willie Pounds, NP [place], [place] Paramedic  09/28/22

## 2022-10-01 ENCOUNTER — Other Ambulatory Visit (HOSPITAL_COMMUNITY): Payer: Self-pay | Admitting: Emergency Medicine

## 2022-10-01 NOTE — Progress Notes (Signed)
Paramedicine Encounter    Patient ID: Willie Campbell, male    DOB: 14-Oct-1971, 51 y.o.   MRN: [id number]   There were no vitals taken for this visit. Weight yesterday-not taken Last visit weight-not taken  Visit done in the parking lot of [facility] as he was there to pick up his refills.  He has been approved for Medicaid that will take effect Dec 1st.  He denies chest pain or SOB.  He has not been weighing but has no signs of edema and overall his weight looks well maintained.  He is now working at [employer] 3rd shift and states he likes his schedule and being able to have his weekends off.  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  We ultimately agreed to 1 more visit before graduation.    Beatrix Shipper, EMT-Paramedic [phone] 10/04/2022   Patient Care Team: Claiborne Rigg, NP as PCP - General (Nurse Practitioner) Lyn Records, MD as PCP - Cardiology (Cardiology)  Patient Active Problem List   Diagnosis Date Noted   Cardiac arrest [facility]) 02/26/2022   STEMI (ST elevation myocardial infarction) (HCC) 02/26/2022   Acute systolic heart failure (HCC)    Cardiogenic shock (HCC)     Current Outpatient Medications:    aspirin 81 MG chewable tablet, Chew 1 tablet (81 mg total) by mouth daily., Disp: 31 tablet, Rfl: 5   atorvastatin (LIPITOR) 40 MG tablet, Take 1 tablet (40 mg total) by mouth daily., Disp: 30 tablet, Rfl: 5   sacubitril-valsartan (ENTRESTO) 24-26 MG, Take 1 tablet by mouth 2 (two) times daily., Disp: 60 tablet, Rfl: 3   spironolactone (ALDACTONE) 25 MG tablet, Take 1 tablet (25 mg total) by mouth daily., Disp: 30 tablet, Rfl: 5   ticagrelor (BRILINTA) 90 MG TABS tablet, Take 1 tablet (90 mg total) by mouth 2 (two) times daily., Disp: 60 tablet, Rfl: 5   carvedilol (COREG) 3.125 MG tablet, Take 1 tablet (3.125 mg total) by mouth 2 (two) times daily with a meal., Disp: 62 tablet, Rfl: 5   empagliflozin  (JARDIANCE) 10 MG TABS tablet, Take 1 tablet (10 mg total) by mouth daily., Disp: 30 tablet, Rfl: 5   nitroGLYCERIN (NITROSTAT) 0.4 MG SL tablet, Place 1 tablet (0.4 mg total) under the tongue every 5 (five) minutes as needed for chest pain., Disp: 25 tablet, Rfl: 0 Allergies  Allergen Reactions   Bee Venom Hives      Social History   Socioeconomic History   Marital status: Single    Spouse name: Not on file   Number of children: Not on file   Years of education: Not on file   Highest education level: Not on file  Occupational History   Not on file  Tobacco Use   Smoking status: Some Days    Packs/day: 0.50    Types: Cigarettes    Last attempt to quit: 02/26/2022    Years since quitting: 0.5   Smokeless tobacco: Never  Substance and Sexual Activity   Alcohol use: Not Currently    Comment: sometimes   Drug use: No   Sexual activity: Not on file  Other Topics Concern   Not on file  Social History Narrative   Not on file   Social Determinants of Health   Financial Resource Strain: High Risk (03/15/2022)   Overall Financial Resource Strain (CARDIA)    Difficulty of Paying Living Expenses: Hard  Food Insecurity: No Food  Insecurity (03/15/2022)   Hunger Vital Sign    Worried About Running Out of Food in the Last Year: Never true    Ran Out of Food in the Last Year: Never true  Transportation Needs: No Transportation Needs (03/15/2022)   PRAPARE - Hydrologist (Medical): No    Lack of Transportation (Non-Medical): No  Physical Activity: Not on file  Stress: Not on file  Social Connections: Not on file  Intimate Partner Violence: Not on file    Physical Exam      Future Appointments  Date Time Provider [place]  12/07/2022  2:10 PM Gildardo Pounds, NP [facility] None       [name], EMT-P-Paramedic [place] Paramedic  10/01/22

## 2022-10-14 ENCOUNTER — Telehealth (HOSPITAL_COMMUNITY): Payer: Self-pay | Admitting: Emergency Medicine

## 2022-10-14 NOTE — Telephone Encounter (Signed)
Text Mr. Lankford to remind him of visit tomorrow @ 10:30 at [facility]. He responded and confirmed same via text msg.    Beatrix Shipper, EMT-Paramedic [phone] 10/14/2022

## 2022-10-15 ENCOUNTER — Telehealth (HOSPITAL_COMMUNITY): Payer: Self-pay

## 2022-10-15 ENCOUNTER — Other Ambulatory Visit (HOSPITAL_COMMUNITY): Payer: Self-pay

## 2022-10-15 NOTE — Telephone Encounter (Signed)
Advanced Heart Failure Patient Advocate Encounter   As of 10/15/2022, this patient has active Medicaid coverage and this will effect the patients needs for medication assistance.   Processing information has been added to WAM.   Stephaine H, CPhT Rx Patient Advocate Phone: [phone] 

## 2022-10-18 ENCOUNTER — Telehealth (HOSPITAL_COMMUNITY): Payer: Self-pay | Admitting: Licensed Clinical Social Worker

## 2022-10-18 NOTE — Telephone Encounter (Signed)
CSW reviewed pt who has been utilizing HF fund to see if they now had Medicaid following Medicaid expansion taking affect on 12/1.  Confirmed pt now has full Medicaid.    CSW mailed out flyer to pt on how to transition their care now that they have insurance.  Will continue to follow and assist as needed  Kyrah Schiro H. Kirin Brandenburger, LCSW Clinical Social Worker Advanced Heart Failure Clinic Desk#: [phone] Cell#: [phone]  

## 2022-11-10 IMAGING — DX DG CHEST 1V PORT
1 series · 1 of 1 positions shown · non-contrast
Comparison: None.

CLINICAL DATA: Intubated. Cardiac arrest.

EXAM:
PORTABLE CHEST 1 VIEW

[chest ap]
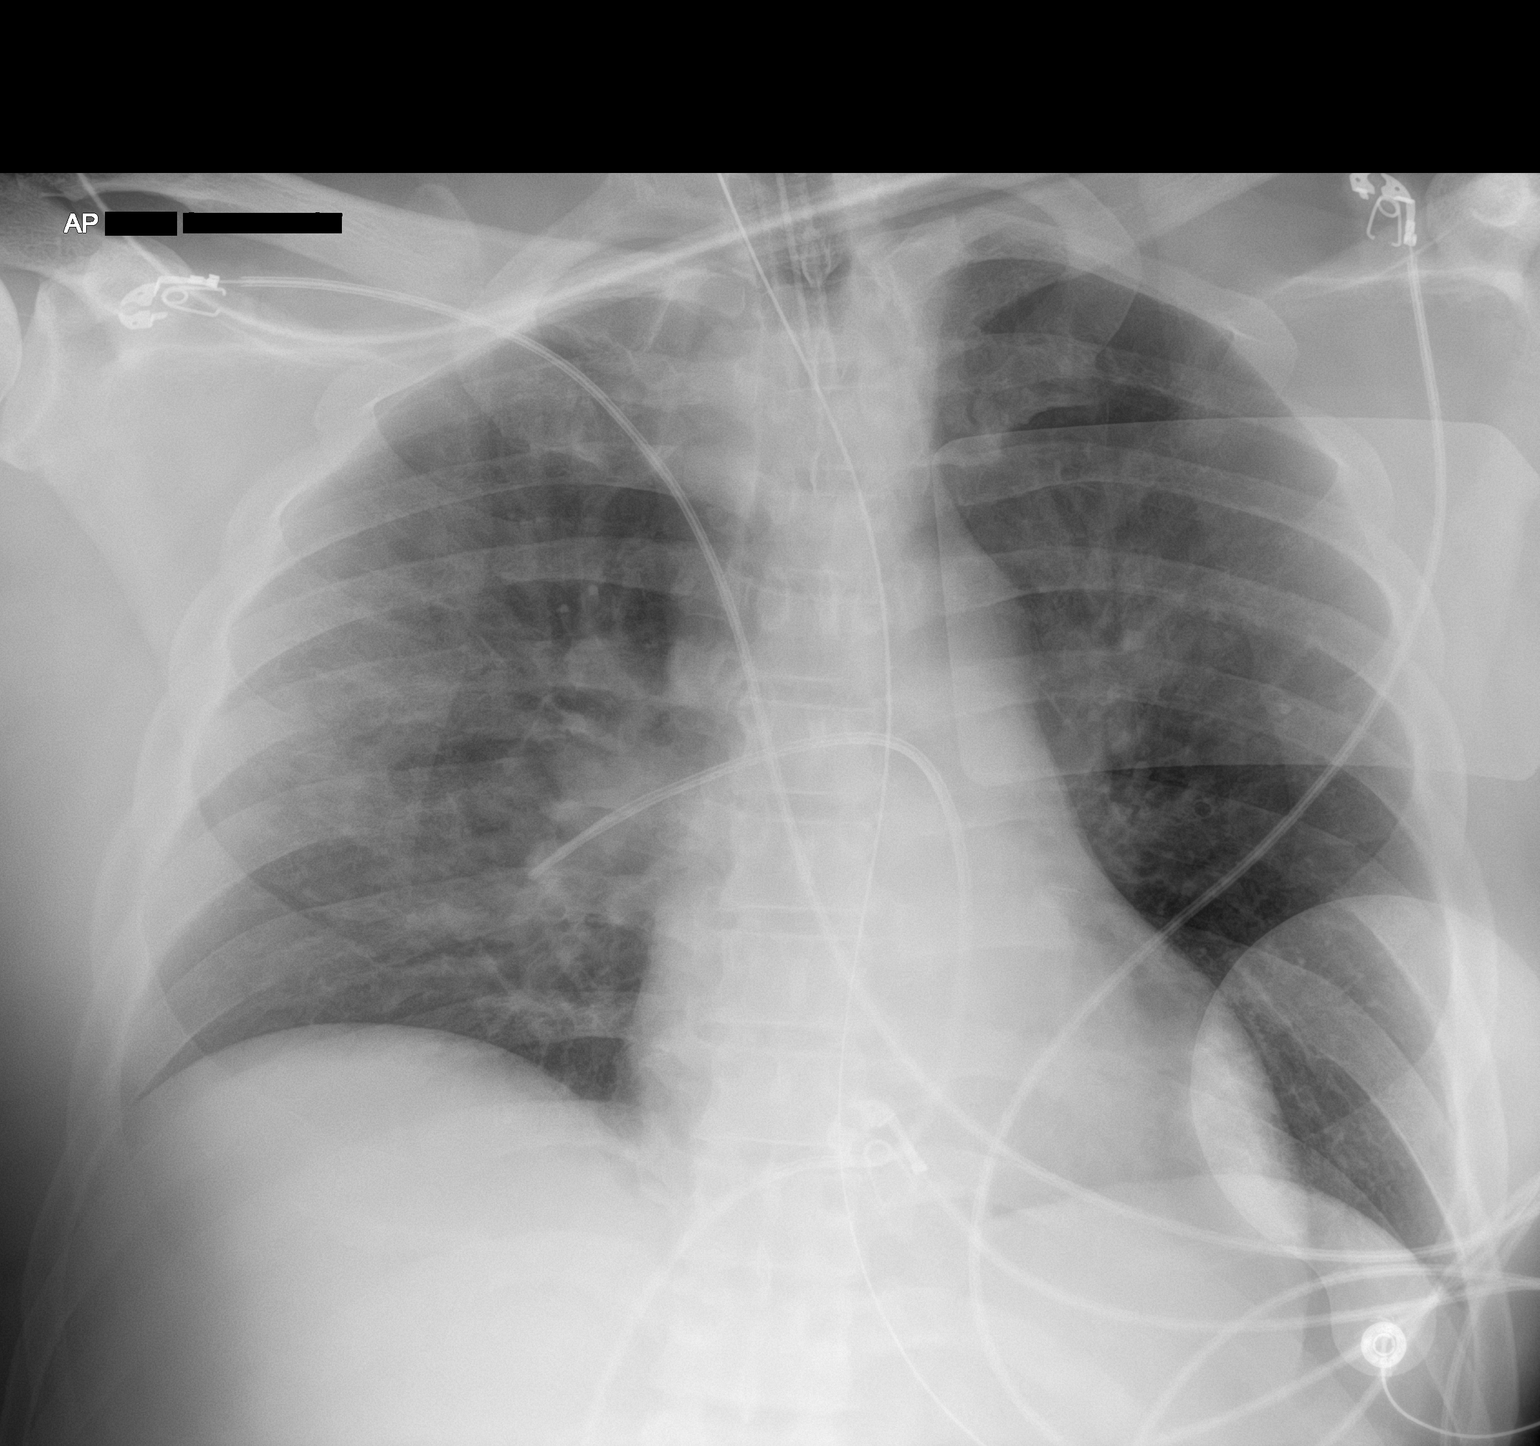

[1 of 1 positions shown; findings below may reference images not displayed]

FINDINGS: Endotracheal tube terminates 8 cm above the carina, the level
clavicles. NG tube courses off the inferior border the film.

A Swan-Ganz catheter enters from the IVC in terminates in the
interlobar artery, somewhat distal. Mild edema is present. No
significant airspace consolidation is present.
IMPRESSION: 1. Endotracheal tube terminates 8 cm above the carina.
2. Swan-Ganz catheter terminates in the interlobar artery.
3. Mild edema.

## 2022-11-11 ENCOUNTER — Other Ambulatory Visit (HOSPITAL_COMMUNITY): Payer: Self-pay | Admitting: Physician Assistant

## 2022-11-11 ENCOUNTER — Other Ambulatory Visit (HOSPITAL_COMMUNITY): Payer: Self-pay

## 2022-11-11 IMAGING — CT CT HEAD W/O CM
4 series · 16 of 47 positions shown, 18 images · non-contrast
Comparison: None.

CLINICAL DATA: Mental status change, unknown cause.



[Series 3: head wo · axial · 0.49mm/px · z∈[-413,-293]mm · 7 of 32 slices shown, 9 images]
[im 4/32  brain]
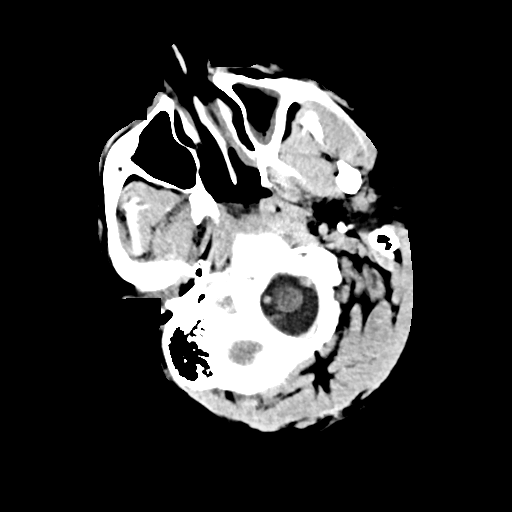
[im 4/32  bone]
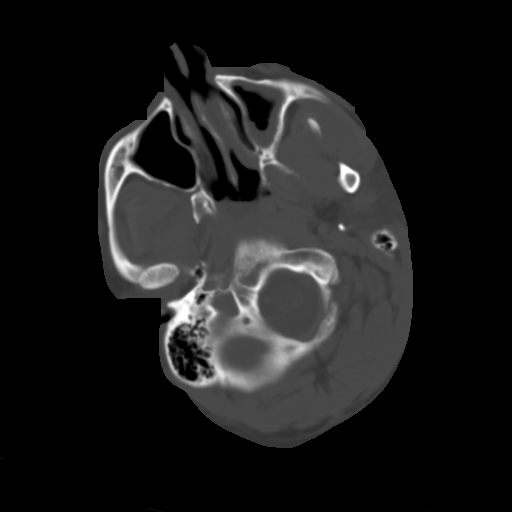
[im 8/32  brain]
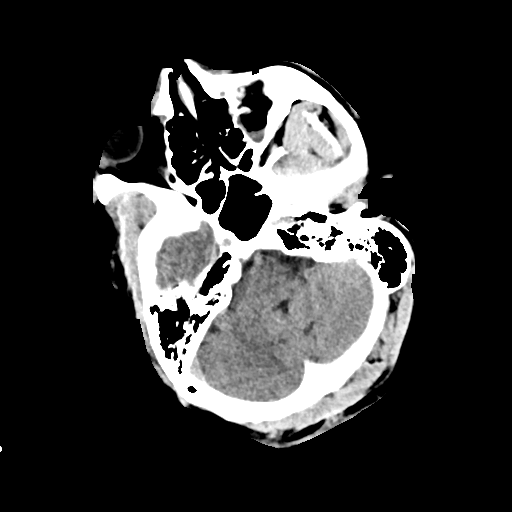
[im 12/32  brain]
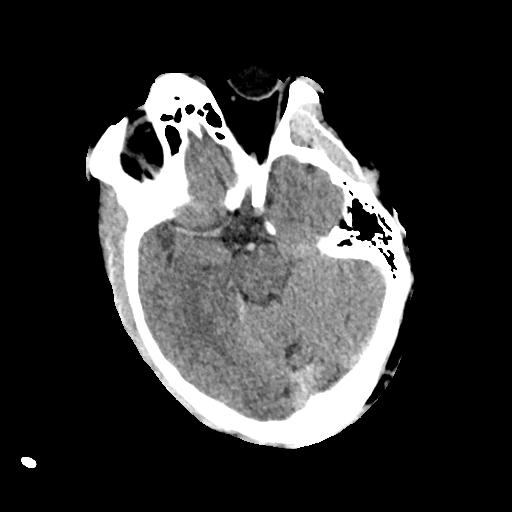
[im 16/32  brain]
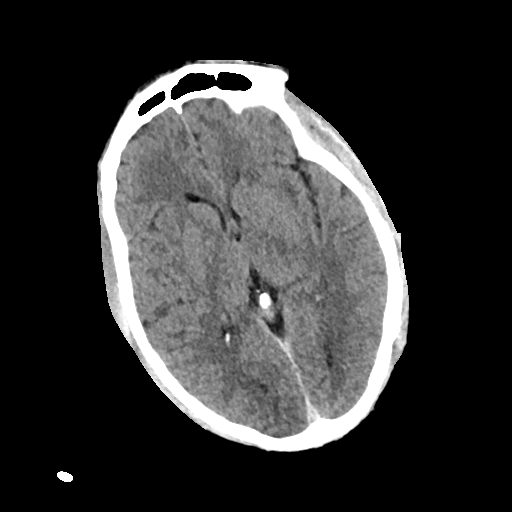
[im 20/32  brain]
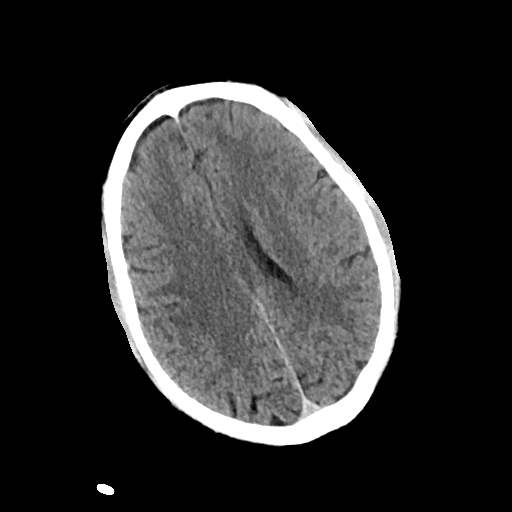
[im 20/32  bone]
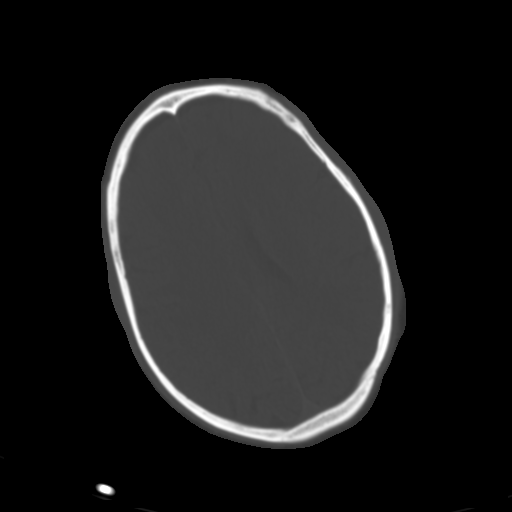
[im 24/32  brain]
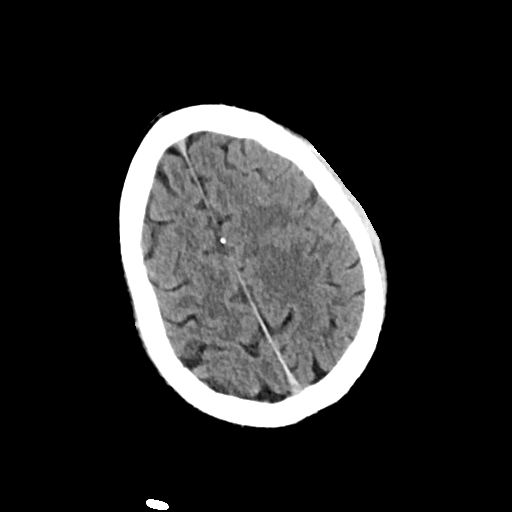
[im 28/32  brain]
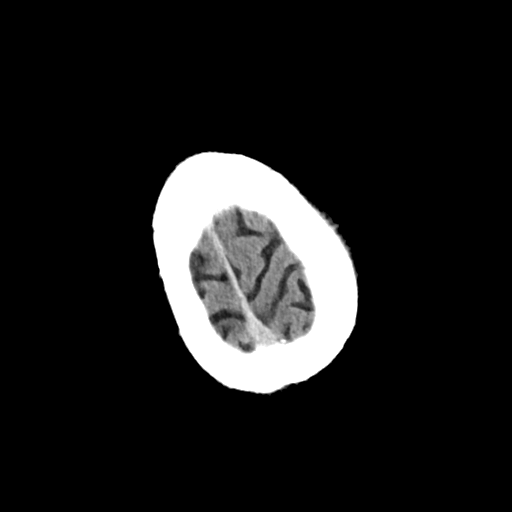

[Series 4: head bone · axial · 0.49mm/px · z∈[-414,-382]mm · 3 of 80 slices shown]
[im 8/80  bone]
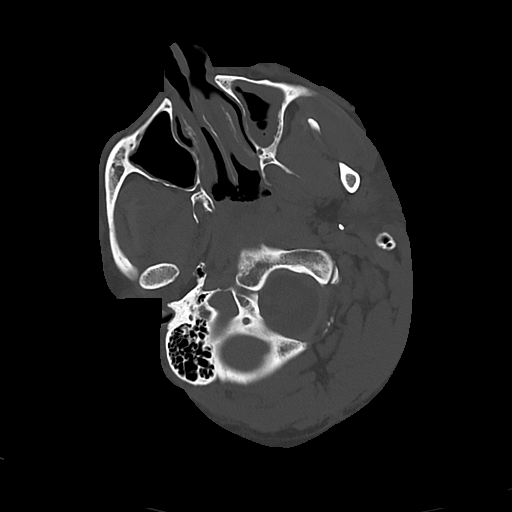
[im 16/80  bone]
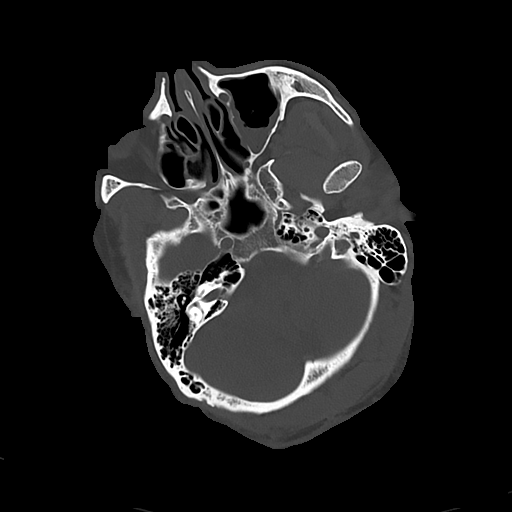
[im 24/80  bone]
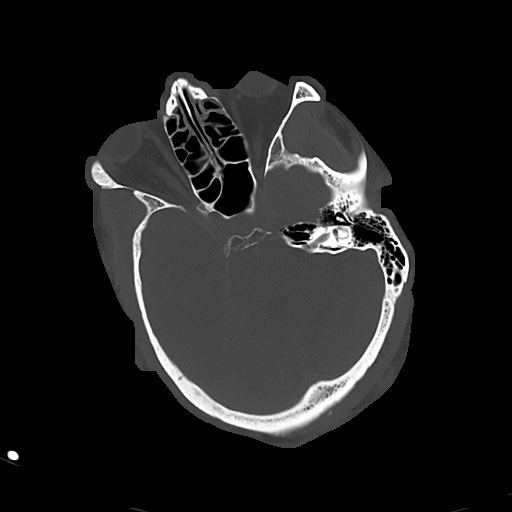

[Series 5: cor soft · coronal · 0.36mm/px · 3 of 78 slices shown]
[im 26/78  brain]
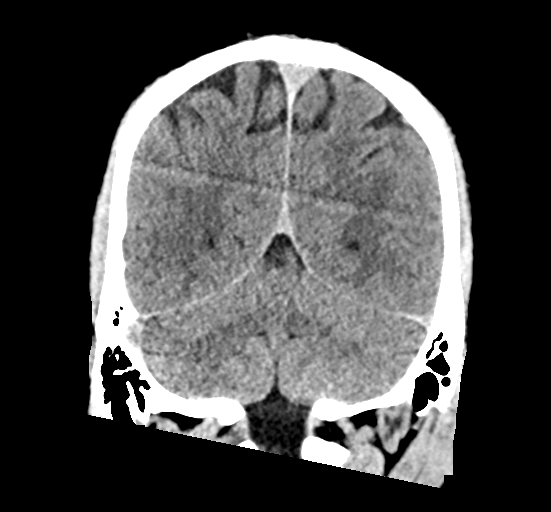
[im 35/78  brain]
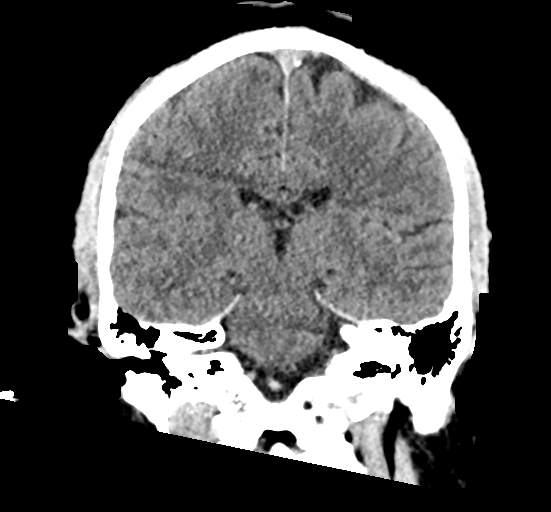
[im 43/78  brain]
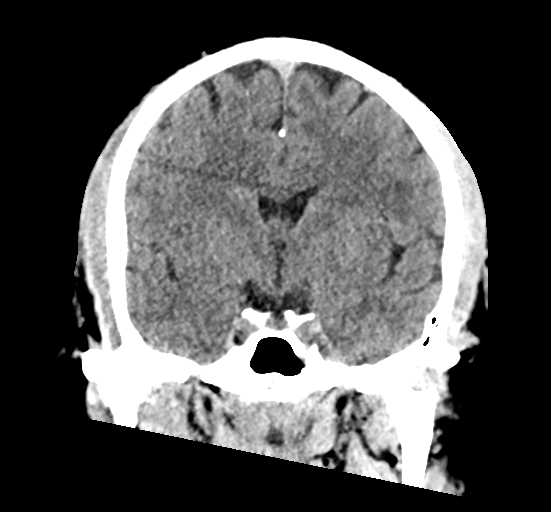

[Series 6: sag soft · sagittal · 0.39mm/px · 3 of 61 slices shown]
[im 25/61  brain]
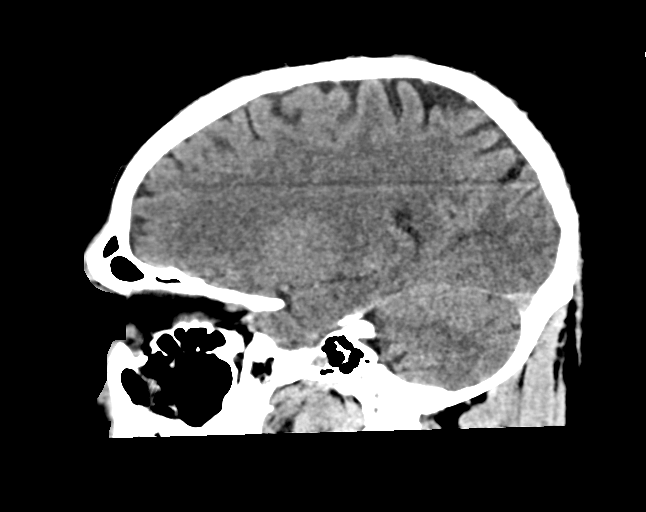
[im 31/61  brain]
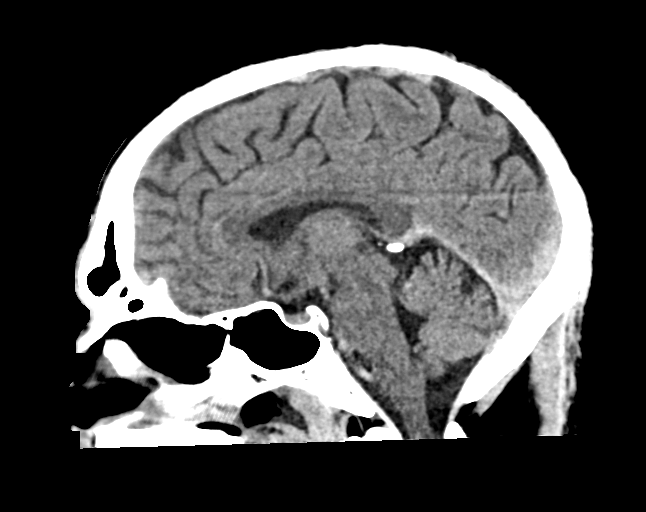
[im 36/61  brain]
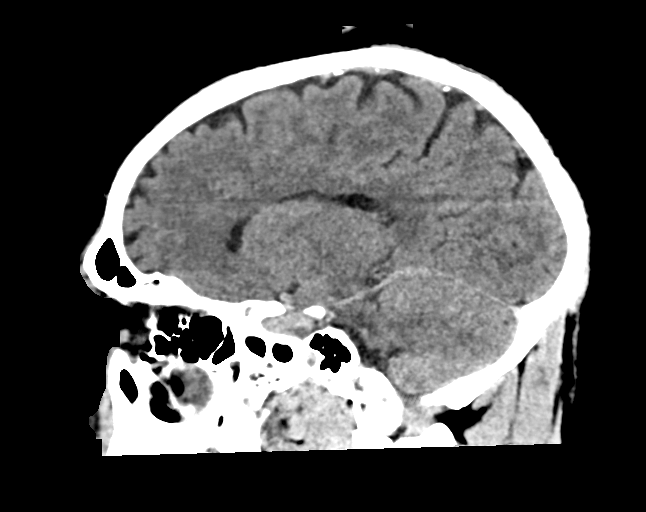

[16 of 47 positions shown; findings below may reference images not displayed]

FINDINGS: Brain: No acute intracranial hemorrhage, midline shift or mass
effect. No extra-axial fluid collection. Gray-white matter
differentiation is within normal limits. No hydrocephalus. Mild
periventricular white matter hypodensities are noted.

Vascular: No hyperdense vessel or unexpected calcification.

Skull: Normal. Negative for fracture or focal lesion.

Sinuses/Orbits: Mucosal thickening is present in the ethmoid air
cells, maxillary sinuses, right frontal sinus and right sphenoid
sinus

Other: None.
IMPRESSION: No acute intracranial process.

## 2022-11-11 MED ORDER — ATORVASTATIN CALCIUM 40 MG PO TABS
40.0000 mg | ORAL_TABLET | Freq: Every day | ORAL | 5 refills | Status: DC
  Filled 2022-11-11: qty 30, 30d supply, fill #0
  Filled 2023-01-20: qty 30, 30d supply, fill #1
  Filled 2023-02-18: qty 30, 30d supply, fill #2
  Filled 2023-03-26 – 2023-04-06 (×2): qty 30, 30d supply, fill #3

## 2022-11-11 MED ORDER — CARVEDILOL 3.125 MG PO TABS
3.1250 mg | ORAL_TABLET | Freq: Two times a day (BID) | ORAL | 5 refills | Status: DC
  Filled 2022-11-11: qty 62, 31d supply, fill #0
  Filled 2023-01-20: qty 62, 31d supply, fill #1
  Filled 2023-02-18: qty 62, 31d supply, fill #2
  Filled 2023-03-26 – 2023-04-06 (×2): qty 62, 31d supply, fill #3

## 2022-11-12 ENCOUNTER — Other Ambulatory Visit (HOSPITAL_COMMUNITY): Payer: Self-pay

## 2022-11-24 ENCOUNTER — Telehealth (HOSPITAL_COMMUNITY): Payer: Self-pay | Admitting: Emergency Medicine

## 2022-11-24 NOTE — Telephone Encounter (Signed)
Called and LVM for Mr. Robarts to call me back.  Want to do a visit on Friday.    [name], [place] 11/24/2022

## 2022-11-26 ENCOUNTER — Other Ambulatory Visit (HOSPITAL_COMMUNITY): Payer: Self-pay | Admitting: Emergency Medicine

## 2022-11-26 NOTE — Progress Notes (Signed)
Paramedicine Encounter    Patient ID: Willie Campbell, male    DOB: 16-Dec-1970, 52 y.o.   MRN: [id number]   Complaints NONE  Assessment DENIES CHEST PAIN OR SOB, NO EDEMA NOTED  Compliance with meds NO  Pill box filled OBSERVED PT ACCURATELY FILL PILL BOX X 2 WEEKS  Refills needed NONE  Meds changes since last visit NONE    Social changes has a 3rd shift job, looking to get his own place   Wt 208 lb (94.3 kg)   BMI 29.01 kg/m  Weight yesterday-not taken Last visit weight-216lb  Mr. Malena reports to be feeling well.  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  It was evident at today's visit that he has not been compliant with his meds.  He had duplicate med bottles that haven't even been open.  He says, "I've just been busy with work and everything."  I observed him reconcile 2 weeks worth of meds without difficulty.  I discussed with him the pitfalls that can occur with being non-compliant with taking his medications and states he understands same.   ACTION: Home visit completed  Skipper Cliche [phone] 11/26/22  Patient Care Team: Gildardo Pounds, NP as PCP - General (Nurse Practitioner) Belva Crome, MD as PCP - Cardiology (Cardiology)  Patient Active Problem List   Diagnosis Date Noted   Cardiac arrest [facility]) 02/26/2022   STEMI (ST elevation myocardial infarction) ([place]) 11/57/2620   Acute systolic heart failure ([place])    Cardiogenic shock (HCC)     Current Outpatient Medications:    aspirin 81 MG chewable tablet, Chew 1 tablet (81 mg total) by mouth daily., Disp: 31 tablet, Rfl: 5   atorvastatin (LIPITOR) 40 MG tablet, Take 1 tablet (40 mg total) by mouth daily., Disp: 30 tablet, Rfl: 5   carvedilol (COREG) 3.125 MG tablet, Take 1 tablet (3.125 mg total) by mouth 2 (two) times daily with a meal., Disp: 62 tablet, Rfl: 5   empagliflozin (JARDIANCE) 10 MG TABS tablet, Take 1 tablet (10 mg total) by mouth daily., Disp: 30 tablet,  Rfl: 5   sacubitril-valsartan (ENTRESTO) 24-26 MG, Take 1 tablet by mouth 2 (two) times daily., Disp: 60 tablet, Rfl: 3   spironolactone (ALDACTONE) 25 MG tablet, Take 1 tablet (25 mg total) by mouth daily., Disp: 30 tablet, Rfl: 5   ticagrelor (BRILINTA) 90 MG TABS tablet, Take 1 tablet (90 mg total) by mouth 2 (two) times daily., Disp: 60 tablet, Rfl: 5   nitroGLYCERIN (NITROSTAT) 0.4 MG SL tablet, Place 1 tablet (0.4 mg total) under the tongue every 5 (five) minutes as needed for chest pain., Disp: 25 tablet, Rfl: 0 Allergies  Allergen Reactions   Bee Venom Hives     Social History   Socioeconomic History   Marital status: Single    Spouse name: Not on file   Number of children: Not on file   Years of education: Not on file   Highest education level: Not on file  Occupational History   Not on file  Tobacco Use   Smoking status: Some Days    Packs/day: 0.50    Types: Cigarettes    Last attempt to quit: 02/26/2022    Years since quitting: 0.7   Smokeless tobacco: Never  Substance and Sexual Activity   Alcohol use: Not Currently    Comment: sometimes   Drug use: No   Sexual activity: Not on file  Other Topics  Concern   Not on file  Social History Narrative   Not on file   Social Determinants of Health   Financial Resource Strain: High Risk (03/15/2022)   Overall Financial Resource Strain (CARDIA)    Difficulty of Paying Living Expenses: Hard  Food Insecurity: No Food Insecurity (03/15/2022)   Hunger Vital Sign    Worried About Running Out of Food in the Last Year: Never true    Ran Out of Food in the Last Year: Never true  Transportation Needs: No Transportation Needs (03/15/2022)   PRAPARE - Hydrologist (Medical): No    Lack of Transportation (Non-Medical): No  Physical Activity: Not on file  Stress: Not on file  Social Connections: Not on file  Intimate Partner Violence: Not on file    Physical Exam      Future Appointments  Date  Time Provider [place]  12/07/2022  2:10 PM Gildardo Pounds, NP [facility] None

## 2022-12-02 ENCOUNTER — Other Ambulatory Visit (HOSPITAL_COMMUNITY): Payer: Self-pay

## 2022-12-02 ENCOUNTER — Other Ambulatory Visit (HOSPITAL_COMMUNITY): Payer: Self-pay | Admitting: Cardiology

## 2022-12-02 MED ORDER — TICAGRELOR 90 MG PO TABS: 90.0000 mg | ORAL_TABLET | Freq: Two times a day (BID) | ORAL | 3 refills | Status: DC

## 2022-12-02 NOTE — Telephone Encounter (Signed)
SCRIPT SENT TO ACCOMPANY PATIENT ASSISTANCE APPLICATION

## 2022-12-03 ENCOUNTER — Other Ambulatory Visit (HOSPITAL_COMMUNITY): Payer: Self-pay

## 2022-12-07 ENCOUNTER — Ambulatory Visit: Payer: Self-pay | Attending: Nurse Practitioner | Admitting: Nurse Practitioner

## 2022-12-08 ENCOUNTER — Encounter (HOSPITAL_COMMUNITY): Payer: Self-pay | Admitting: Emergency Medicine

## 2022-12-08 NOTE — Progress Notes (Signed)
Patient is now discharged from [facility].  Patient has/has not met the following goals:  Yes :Patient expresses basic understanding of medications and what they are for Yes :Patient able to verbalize heart failure specific dietary/fluid restrictions Yes :Patient is aware of who to call if they have medical concerns or if they need to schedule or change appts Yes :Patient has a scale for daily weights and weighs regularly Yes :Patient able to verbalize concerning symptoms when they should call the HF clinic (weight gain ranges, etc) Yes :Patient has a PCP and has seen within the past year or has upcoming appt Yes :Patient has reliable access to getting their medications Yes :Patient has shown they are able to reorder medications reliably No :Patient has had admission in past 30 days- if yes how many? No :Patient has had admission in past 90 days- if yes how many?  Discharge Comments:    At last pt outreach visit discussed with Willie Campbell graduation from the paramedicine program.  He demonstrates the ability to correctly fill his pill box and picks up his meds from the pharmacy when needed.  Last admission was 02/26/2022 when his heart failure began.  He has demonstrated the ability to do all the thing with his meds.  I do believe he has not been as compliant as he should as there are multiple unopened med bottles.  He is making a conscious choice in his non-compliance and we discussed the repercussions of same.    At the recommendation of providers at the heart failure clinic Willie Campbell is being discharged.  I advised him to reach out should he have further questions or needs.    Willie Campbell, [place] 12/08/2022

## 2023-01-20 ENCOUNTER — Encounter (HOSPITAL_COMMUNITY): Payer: Self-pay | Admitting: *Deleted

## 2023-01-20 ENCOUNTER — Other Ambulatory Visit (HOSPITAL_COMMUNITY): Payer: Self-pay

## 2023-01-20 ENCOUNTER — Ambulatory Visit (HOSPITAL_COMMUNITY)
Admission: EM | Admit: 2023-01-20 | Discharge: 2023-01-20 | Disposition: A | Discharge status: Home / Self Care | Payer: Commercial Managed Care - HMO | Attending: Internal Medicine | Admitting: Internal Medicine

## 2023-01-20 DIAGNOSIS — M940 Chondrocostal junction syndrome [Tietze]: Secondary | ICD-10-CM

## 2023-01-20 DIAGNOSIS — M5431 Sciatica, right side: Secondary | ICD-10-CM | POA: Diagnosis not present

## 2023-01-20 MED ORDER — METHOCARBAMOL 500 MG PO TABS
500.0000 mg | ORAL_TABLET | Freq: Every evening | ORAL | 0 refills | Status: DC | PRN
  Filled 2023-01-20: qty 20, 20d supply, fill #0

## 2023-01-20 MED ORDER — TRAMADOL HCL 50 MG PO TABS
50.0000 mg | ORAL_TABLET | Freq: Two times a day (BID) | ORAL | 0 refills | Status: DC | PRN
  Filled 2023-01-20: qty 10, 5d supply, fill #0
  Filled 2023-01-26: qty 2, 1d supply, fill #1

## 2023-01-20 NOTE — ED Provider Notes (Signed)
[place]    CSN: [id number] Arrival date & time: 01/20/23  1217      History   Chief Complaint Chief Complaint  Patient presents with   Chest Pain   Leg Pain    HPI Willie Campbell is a 52 y.o. male comes to the urgent care with 1 day history of right-sided lower back pain.  Right-sided back pain started abruptly and has been persistent.  Pain is currently 4-5 out of 10, sharp and radiates to right leg.  Patient has some numbness of the right leg.  No weakness in the right leg.  Patient has had a similar episode sometime back but that resolved by itself.  Pain is aggravated by movement.  He has not tried any over-the-counter medication.  Patient complains of right-sided chest pain.  The chest pain is sharp, intermittent and last for about a minute when it happens.  Pain is on the right side.  It is aggravated by stretching and moving the right upper extremity.  Patient denies any trauma or falls.  He denies any heavy lifting.  His job requires him to do repetitive movement.  Pain does not radiate to the shoulders or to the back.  No cough or sputum production.  No fever or chills.   HPI  Past Medical History:  Diagnosis Date   CAD (coronary artery disease)    Cardiac arrest ([place]) 02/26/2022   CHF (congestive heart failure) (HCC)    STEMI (ST elevation myocardial infarction) ([place]) 02/26/2022    Patient Active Problem List   Diagnosis Date Noted   Cardiac arrest ([place]) 02/26/2022   STEMI (ST elevation myocardial infarction) ([place]) [id number]   Acute systolic heart failure ([place])    Cardiogenic shock (HCC)     Past Surgical History:  Procedure Laterality Date   CORONARY STENT INTERVENTION N/A 02/26/2022   Procedure: CORONARY STENT INTERVENTION;  Surgeon: Belva Crome, MD;  Location: [facility];  Service: Cardiovascular;  Laterality: N/A;   HERNIA REPAIR     RIGHT/LEFT HEART CATH AND CORONARY ANGIOGRAPHY N/A 02/26/2022   Procedure: RIGHT/LEFT HEART CATH AND  CORONARY ANGIOGRAPHY;  Surgeon: Belva Crome, MD;  Location: [facility];  Service: Cardiovascular;  Laterality: N/A;       Home Medications    Prior to Admission medications   Medication Sig Start Date End Date Taking? Authorizing Provider  aspirin 81 MG chewable tablet Chew 1 tablet (81 mg total) by mouth daily. 03/02/22  Yes Joette Catching, PA-C  atorvastatin (LIPITOR) 40 MG tablet Take 1 tablet (40 mg total) by mouth daily. 11/11/22  Yes Joette Catching, PA-C  carvedilol (COREG) 3.125 MG tablet Take 1 tablet (3.125 mg total) by mouth 2 (two) times daily with a meal. 11/11/22  Yes Joette Catching, PA-C  empagliflozin (JARDIANCE) 10 MG TABS tablet Take 1 tablet (10 mg total) by mouth daily. 03/02/22  Yes Joette Catching, PA-C  methocarbamol (ROBAXIN) 500 MG tablet Take 1 tablet (500 mg total) by mouth at bedtime as needed for muscle spasms. 01/20/23  Yes Lynnelle Mesmer, Myrene Galas, MD  sacubitril-valsartan (ENTRESTO) 24-26 MG Take 1 tablet by mouth 2 (two) times daily. 04/28/22  Yes Bensimhon, Shaune Pascal, MD  spironolactone (ALDACTONE) 25 MG tablet Take 1 tablet (25 mg total) by mouth daily. 04/14/22  Yes Bensimhon, Shaune Pascal, MD  ticagrelor (BRILINTA) 90 MG TABS tablet Take 1 tablet (90 mg total) by mouth 2 (two) times daily. 12/02/22  Yes  Bensimhon, Shaune Pascal, MD  traMADol (ULTRAM) 50 MG tablet Take 1 tablet (50 mg total) by mouth every 12 (twelve) hours as needed. 01/20/23  Yes Kayci [place]ville, Myrene Galas, MD  nitroGLYCERIN (NITROSTAT) 0.4 MG SL tablet Place 1 tablet (0.4 mg total) under the tongue every 5 (five) minutes as needed for chest pain. 03/01/22 03/01/23  Joette Catching, PA-C    Family History History reviewed. No pertinent family history.  Social History Social History   Tobacco Use   Smoking status: Some Days    Packs/day: 0.50    Types: Cigarettes    Last attempt to quit: 02/26/2022    Years since quitting: 0.8   Smokeless tobacco: Never  Vaping Use    Vaping Use: Never used  Substance Use Topics   Alcohol use: Yes    Comment: sometimes   Drug use: No     Allergies   Bee venom   Review of Systems Review of Systems As per HPI  Physical Exam Triage Vital Signs ED Triage Vitals  Enc Vitals Group     BP 01/20/23 1345 127/87     Pulse Rate 01/20/23 1345 85     Resp 01/20/23 1345 18     Temp 01/20/23 1345 98 F (36.7 C)     Temp Source 01/20/23 1345 Oral     SpO2 01/20/23 1345 95 %     Weight --      Height --      Head Circumference --      Peak Flow --      Pain Score 01/20/23 1343 7     Pain Loc --      Pain Edu? --      Excl. in [place]? --    No data found.  Updated Vital Signs BP 127/87 (BP Location: Left Arm)   Pulse 85   Temp 98 F (36.7 C) (Oral)   Resp 18   SpO2 95%   Visual Acuity Right Eye Distance:   Left Eye Distance:   Bilateral Distance:    Right Eye Near:   Left Eye Near:    Bilateral Near:     Physical Exam Vitals and nursing note reviewed.  Constitutional:      General: He is not in acute distress.    Appearance: He is not ill-appearing.  Cardiovascular:     Heart sounds: Normal heart sounds.  Pulmonary:     Effort: Pulmonary effort is normal.     Breath sounds: Normal breath sounds.  Musculoskeletal:        General: Normal range of motion.     Comments: Right straight leg raise test is negative.  Deep tendon reflexes are 2+ in both knees.  Tenderness on palpation of the right costochondral joints.  Neurological:     Mental Status: He is alert.      UC Treatments / Results  Labs (all labs ordered are listed, but only abnormal results are displayed) Labs Reviewed - No data to display  EKG   Radiology No results found.  Procedures Procedures (including critical care time)  Medications Ordered in UC Medications - No data to display  Initial Impression / Assessment and Plan / UC Course  I have reviewed the triage vital signs and the nursing notes.  Pertinent labs &  imaging results that were available during my care of the patient were reviewed by me and considered in my medical decision making (see chart for details).     1.  Right-sided  sciatica: Tramadol 50 mg twice daily as needed for pain NSAIDs was not prescribed because of recent history of STEMI and the use of antiplatelet therapy Robaxin nightly as needed for back stiffness. Heating pad use only 20 minutes on-20 minutes off cycle Gentle stretching exercises Return precautions given.  2.  Chest wall pain (costochondritis): EKG showed T wave inversions in V leads V3, V4 and V5.  This is unchanged from previous EKGs. Patient's symptoms are atypical for ACS Chest wall tenderness was reproducible Return precautions given.  Final Clinical Impressions(s) / UC Diagnoses   Final diagnoses:  Right sided sciatica  Costochondritis     Discharge Instructions      Heating pad use on the 20 minutes on-20 minutes off cycle Gentle stretching exercises as the pain improves Please take medications as prescribed No imaging studies indicated at this point Your EKG looks reassuring Your chest wall is likely musculoskeletal Return to the urgent care if you have any other concerns.   ED Prescriptions     Medication Sig Dispense Auth. Provider   methocarbamol (ROBAXIN) 500 MG tablet Take 1 tablet (500 mg total) by mouth at bedtime as needed for muscle spasms. 20 tablet Chalisa Kobler, Myrene Galas, MD   traMADol (ULTRAM) 50 MG tablet Take 1 tablet (50 mg total) by mouth every 12 (twelve) hours as needed. 12 tablet Yanni Quiroa, Myrene Galas, MD      I have reviewed the PDMP during this encounter.   Chase Picket, MD 01/20/23 [phone]

## 2023-01-20 NOTE — Discharge Instructions (Addendum)
Heating pad use on the 20 minutes on-20 minutes off cycle Gentle stretching exercises as the pain improves Please take medications as prescribed No imaging studies indicated at this point Your EKG looks reassuring Your chest wall is likely musculoskeletal Return to the urgent care if you have any other concerns.

## 2023-01-20 NOTE — ED Triage Notes (Signed)
Pt states he had a heart attack in April 2023 and stent placed. He has having chest pain x 2 days and back pain x 1 week. He denies needing to take his nitro.   He did state he picked up a box at work but it wasn't heavy maybe he pulled his back but wasn't sure.

## 2023-01-24 ENCOUNTER — Ambulatory Visit: Payer: Commercial Managed Care - HMO | Attending: Nurse Practitioner | Admitting: Nurse Practitioner

## 2023-01-24 ENCOUNTER — Encounter: Payer: Self-pay | Admitting: Nurse Practitioner

## 2023-01-24 VITALS — BP 121/77 | HR 81 | Ht 71.0 in | Wt 210.6 lb

## 2023-01-24 DIAGNOSIS — M62838 Other muscle spasm: Secondary | ICD-10-CM | POA: Diagnosis not present

## 2023-01-24 DIAGNOSIS — E876 Hypokalemia: Secondary | ICD-10-CM | POA: Diagnosis not present

## 2023-01-24 DIAGNOSIS — D72829 Elevated white blood cell count, unspecified: Secondary | ICD-10-CM | POA: Diagnosis not present

## 2023-01-24 DIAGNOSIS — I5022 Chronic systolic (congestive) heart failure: Secondary | ICD-10-CM | POA: Diagnosis not present

## 2023-01-24 DIAGNOSIS — Z125 Encounter for screening for malignant neoplasm of prostate: Secondary | ICD-10-CM

## 2023-01-24 DIAGNOSIS — R7309 Other abnormal glucose: Secondary | ICD-10-CM | POA: Diagnosis not present

## 2023-01-24 DIAGNOSIS — Z1211 Encounter for screening for malignant neoplasm of colon: Secondary | ICD-10-CM

## 2023-01-24 MED ORDER — METHOCARBAMOL 500 MG PO TABS
500.0000 mg | ORAL_TABLET | Freq: Every evening | ORAL | 1 refills | Status: DC | PRN
  Filled 2023-01-24: qty 30, 30d supply, fill #0

## 2023-01-24 NOTE — Progress Notes (Signed)
Assessment & Plan:  Willie Campbell was seen today for blood pressure check.  Diagnoses and all orders for this visit:  Chronic systolic heart failure ([place]) Follow up with Cardiology as instructed  Colon cancer screening -     Ambulatory referral to Gastroenterology  Prostate cancer screening -     PSA  Leukocytosis, unspecified type -     CBC with Differential  Hypokalemia -     CMP14+EGFR  Elevated glucose -     CMP14+EGFR  Muscle spasm -     methocarbamol (ROBAXIN) 500 MG tablet; Take 1 tablet (500 mg total) by mouth at bedtime as needed for muscle spasms.    Patient has been counseled on age-appropriate routine health concerns for screening and prevention. These are reviewed and up-to-date. Referrals have been placed accordingly. Immunizations are up-to-date or declined.    Subjective:   Chief Complaint  Patient presents with   Blood Pressure Check   HPI Willie Campbell 52 y.o. male presents to office today for follow up.    PMH: GERD, Tobacco dependnece, CAD, Cardiac arrest, CHF, STEMI, PCE/DES LAD.  Blood pressure is well controlled. He is overdue for follow up with Cardiology. Taking carvedilol 3.125 mg BID, Jardiance 10 mg daily, entresto 24-26 mg BID and spironolactone 25 mg daily.  BP Readings from Last 3 Encounters:  01/24/23 121/77  01/20/23 127/87  11/26/22 128/80   LDL at goal with atorvastatin 40 mg daily.  Lab Results  Component Value Date   LDLCALC 63 04/20/2022    ROS  Past Medical History:  Diagnosis Date   CAD (coronary artery disease)    Cardiac arrest ([place]) 02/26/2022   CHF (congestive heart failure) (HCC)    STEMI (ST elevation myocardial infarction) ([place]) 02/26/2022    Past Surgical History:  Procedure Laterality Date   CORONARY STENT INTERVENTION N/A 02/26/2022   Procedure: CORONARY STENT INTERVENTION;  Surgeon: Belva Crome, MD;  Location: [facility];  Service: Cardiovascular;  Laterality: N/A;   HERNIA REPAIR     RIGHT/LEFT  HEART CATH AND CORONARY ANGIOGRAPHY N/A 02/26/2022   Procedure: RIGHT/LEFT HEART CATH AND CORONARY ANGIOGRAPHY;  Surgeon: Belva Crome, MD;  Location: [facility];  Service: Cardiovascular;  Laterality: N/A;    History reviewed. No pertinent family history.  Social History Reviewed with no changes to be made today.   Outpatient Medications Prior to Visit  Medication Sig Dispense Refill   aspirin 81 MG chewable tablet Chew 1 tablet (81 mg total) by mouth daily. 31 tablet 5   atorvastatin (LIPITOR) 40 MG tablet Take 1 tablet (40 mg total) by mouth daily. 30 tablet 5   carvedilol (COREG) 3.125 MG tablet Take 1 tablet (3.125 mg total) by mouth 2 (two) times daily with a meal. 62 tablet 5   empagliflozin (JARDIANCE) 10 MG TABS tablet Take 1 tablet (10 mg total) by mouth daily. 30 tablet 5   nitroGLYCERIN (NITROSTAT) 0.4 MG SL tablet Place 1 tablet (0.4 mg total) under the tongue every 5 (five) minutes as needed for chest pain. 25 tablet 0   sacubitril-valsartan (ENTRESTO) 24-26 MG Take 1 tablet by mouth 2 (two) times daily. 60 tablet 3   spironolactone (ALDACTONE) 25 MG tablet Take 1 tablet (25 mg total) by mouth daily. 30 tablet 5   ticagrelor (BRILINTA) 90 MG TABS tablet Take 1 tablet (90 mg total) by mouth 2 (two) times daily. 180 tablet 3   traMADol (ULTRAM) 50 MG tablet Take 1  tablet (50 mg total) by mouth every 12 (twelve) hours as needed. 12 tablet 0   methocarbamol (ROBAXIN) 500 MG tablet Take 1 tablet (500 mg total) by mouth at bedtime as needed for muscle spasms. 20 tablet 0   No facility-administered medications prior to visit.    Allergies  Allergen Reactions   Bee Venom Hives       Objective:    BP 121/77   Pulse 81   Ht '5\' 11"'$  (1.803 m)   Wt 210 lb 9.6 oz (95.5 kg)   SpO2 97%   BMI 29.37 kg/m  Wt Readings from Last 3 Encounters:  01/24/23 210 lb 9.6 oz (95.5 kg)  11/26/22 208 lb (94.3 kg)  09/06/22 216 lb 9.6 oz (98.2 kg)    Physical Exam        Patient has been counseled extensively about nutrition and exercise as well as the importance of adherence with medications and regular follow-up. The patient was given clear instructions to go to ER or return to medical center if symptoms don't improve, worsen or new problems develop. The patient verbalized understanding.   Follow-up: Return in about 3 months (around 04/26/2023).   Gildardo Pounds, FNP-BC [facility], [place]   01/24/2023, 9:22 PM

## 2023-01-24 NOTE — Patient Instructions (Signed)
Shaune Pascal. Bensimhon, MD Address: [address], [place], [place] [address] Phone: [phone]

## 2023-01-25 ENCOUNTER — Telehealth: Payer: Self-pay

## 2023-01-25 ENCOUNTER — Other Ambulatory Visit (HOSPITAL_COMMUNITY): Payer: Self-pay

## 2023-01-25 DIAGNOSIS — Z1211 Encounter for screening for malignant neoplasm of colon: Secondary | ICD-10-CM

## 2023-01-25 LAB — CBC WITH DIFFERENTIAL/PLATELET
Basophils Absolute: 0.1 10*3/uL (ref 0.0–0.2)
Basos: 1 %
EOS (ABSOLUTE): 0.1 10*3/uL (ref 0.0–0.4)
Eos: 1 %
Hematocrit: 46.9 % (ref 37.5–51.0)
Hemoglobin: 16.1 g/dL (ref 13.0–17.7)
Immature Grans (Abs): 0 10*3/uL (ref 0.0–0.1)
Immature Granulocytes: 0 %
Lymphocytes Absolute: 2.3 10*3/uL (ref 0.7–3.1)
Lymphs: 27 %
MCH: 33.2 pg — ABNORMAL HIGH (ref 26.6–33.0)
MCHC: 34.3 g/dL (ref 31.5–35.7)
MCV: 97 fL (ref 79–97)
Monocytes Absolute: 0.4 10*3/uL (ref 0.1–0.9)
Monocytes: 4 %
Neutrophils Absolute: 5.8 10*3/uL (ref 1.4–7.0)
Neutrophils: 67 %
Platelets: 285 10*3/uL (ref 150–450)
RBC: 4.85 x10E6/uL (ref 4.14–5.80)
RDW: 11 % — ABNORMAL LOW (ref 11.6–15.4)
WBC: 8.7 10*3/uL (ref 3.4–10.8)

## 2023-01-25 LAB — CMP14+EGFR
ALT: 17 IU/L (ref 0–44)
AST: 14 IU/L (ref 0–40)
Albumin/Globulin Ratio: 1.8 (ref 1.2–2.2)
Albumin: 4.6 g/dL (ref 3.8–4.9)
Alkaline Phosphatase: 132 IU/L — ABNORMAL HIGH (ref 44–121)
BUN/Creatinine Ratio: 9 (ref 9–20)
BUN: 10 mg/dL (ref 6–24)
Bilirubin Total: 0.8 mg/dL (ref 0.0–1.2)
CO2: 25 mmol/L (ref 20–29)
Calcium: 10 mg/dL (ref 8.7–10.2)
Chloride: 103 mmol/L (ref 96–106)
Creatinine, Ser: 1.17 mg/dL (ref 0.76–1.27)
Globulin, Total: 2.6 g/dL (ref 1.5–4.5)
Glucose: 91 mg/dL (ref 70–99)
Potassium: 4.4 mmol/L (ref 3.5–5.2)
Sodium: 138 mmol/L (ref 134–144)
Total Protein: 7.2 g/dL (ref 6.0–8.5)
eGFR: 75 mL/min/{1.73_m2} (ref >59)

## 2023-01-25 LAB — PSA: Prostate Specific Ag, Serum: 2 ng/mL (ref 0.0–4.0)

## 2023-01-25 NOTE — Telephone Encounter (Signed)
Gastroenterology Pre-Procedure Review Will call patient back to schedule after cardiac clearance has been obtained.  Sent to Dr. Jeffie Pollock  Request Date: TBD Requesting Physician: Dr. Dellie Catholic  PATIENT REVIEW QUESTIONS: The patient responded to the following health history questions as indicated:    1. Are you having any GI issues? no 2. Do you have a personal history of Polyps? no 3. Do you have a family history of Colon Cancer or Polyps? no 4. Diabetes Mellitus? no 5. Joint replacements in the past 12 months?no 6. Major health problems in the past 3 months? No however heart attack last April 2023 7. Any artificial heart valves, MVP, or defibrillator?no    MEDICATIONS & ALLERGIES:    Patient reports the following regarding taking any anticoagulation/antiplatelet therapy:   Plavix, Coumadin, Eliquis, Xarelto, Lovenox, Pradaxa, Brilinta, or Effient? yes (takes Brilinta cardiac clearance and blood thinner advice sent to Dr. Tempie Hoist) Aspirin? yes (81 mg daily)  Patient confirms/reports the following medications:  Current Outpatient Medications  Medication Sig Dispense Refill   aspirin 81 MG chewable tablet Chew 1 tablet (81 mg total) by mouth daily. 31 tablet 5   atorvastatin (LIPITOR) 40 MG tablet Take 1 tablet (40 mg total) by mouth daily. 30 tablet 5   carvedilol (COREG) 3.125 MG tablet Take 1 tablet (3.125 mg total) by mouth 2 (two) times daily with a meal. 62 tablet 5   empagliflozin (JARDIANCE) 10 MG TABS tablet Take 1 tablet (10 mg total) by mouth daily. 30 tablet 5   methocarbamol (ROBAXIN) 500 MG tablet Take 1 tablet (500 mg total) by mouth at bedtime as needed for muscle spasms. 30 tablet 1   nitroGLYCERIN (NITROSTAT) 0.4 MG SL tablet Place 1 tablet (0.4 mg total) under the tongue every 5 (five) minutes as needed for chest pain. 25 tablet 0   sacubitril-valsartan (ENTRESTO) 24-26 MG Take 1 tablet by mouth 2 (two) times daily. 60 tablet 3   spironolactone (ALDACTONE) 25 MG tablet  Take 1 tablet (25 mg total) by mouth daily. 30 tablet 5   ticagrelor (BRILINTA) 90 MG TABS tablet Take 1 tablet (90 mg total) by mouth 2 (two) times daily. 180 tablet 3   traMADol (ULTRAM) 50 MG tablet Take 1 tablet (50 mg total) by mouth every 12 (twelve) hours as needed. 12 tablet 0   No current facility-administered medications for this visit.    Patient confirms/reports the following allergies:  Allergies  Allergen Reactions   Bee Venom Hives    No orders of the defined types were placed in this encounter.   AUTHORIZATION INFORMATION Primary Insurance: 1D#: Group #:  Secondary Insurance: 1D#: Group #:  SCHEDULE INFORMATION: Date:  Time: Location:

## 2023-01-26 ENCOUNTER — Other Ambulatory Visit (HOSPITAL_COMMUNITY): Payer: Self-pay

## 2023-01-27 ENCOUNTER — Other Ambulatory Visit (HOSPITAL_COMMUNITY): Payer: Self-pay

## 2023-01-29 ENCOUNTER — Ambulatory Visit (HOSPITAL_COMMUNITY)
Admission: EM | Admit: 2023-01-29 | Discharge: 2023-01-29 | Disposition: A | Discharge status: Home / Self Care | Payer: Medicaid Other | Attending: Emergency Medicine | Admitting: Emergency Medicine

## 2023-01-29 ENCOUNTER — Encounter (HOSPITAL_COMMUNITY): Payer: Self-pay

## 2023-01-29 DIAGNOSIS — M5431 Sciatica, right side: Secondary | ICD-10-CM | POA: Diagnosis not present

## 2023-01-29 MED ORDER — DEXAMETHASONE SODIUM PHOSPHATE 10 MG/ML IJ SOLN
INTRAMUSCULAR | Status: AC
  Filled 2023-01-29: qty 1

## 2023-01-29 MED ORDER — NAPROXEN 500 MG PO TABS: 500.0000 mg | ORAL_TABLET | Freq: Two times a day (BID) | ORAL | 0 refills | Status: AC

## 2023-01-29 MED ORDER — PREDNISONE 10 MG (21) PO TBPK: ORAL_TABLET | Freq: Every day | ORAL | 0 refills | Status: DC

## 2023-01-29 MED ORDER — DEXAMETHASONE SODIUM PHOSPHATE 10 MG/ML IJ SOLN
10.0000 mg | Freq: Once | INTRAMUSCULAR | Status: AC
  Administered 2023-01-29: 10 mg via INTRAMUSCULAR

## 2023-01-29 NOTE — Discharge Instructions (Signed)
We have given you a steroid injection in clinic, please start the steroid Dosepak tomorrow morning, take this with breakfast.  You can take the anti-inflammatories twice daily with food.  Please perform the exercise we discussed, there are exercises attached to this discharge paper as well.  If your pain and symptoms persist, call and schedule an appointment with Raliegh Ip on Monday.  Please return to clinic or seek immediate care if you develop any inner leg numbness, incontinence, inability to walk, or worsening of pain.

## 2023-01-29 NOTE — ED Triage Notes (Signed)
Patient reports that he hurt his back on 01/17/23 and was seen on 01/20/23. 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 Patient is now saying the pain radiates into the right hip and the back of his right leg.  Patient states he has not taken anything for his pain today.

## 2023-01-29 NOTE — ED Provider Notes (Signed)
[place]    CSN: [id number] Arrival date & time: 01/29/23  1500      History   Chief Complaint Chief Complaint  Patient presents with   Back Pain    HPI Willie Campbell is a 52 y.o. male.   Patient reports injury to his back on March 4 and was seen March 7.  He took the tramadol and muscle relaxer, these helped a little bit.  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  Now the pain is radiating from his right hip down the back of his hamstring. His hip starts to hurt so bad when he is in bed that he has to roll over into a squat to get up.  Denies incontinence, saddle anesthesia, loss of bowel or bladder function, or inability to walk.   The history is provided by the patient.  Back Pain Associated symptoms: no abdominal pain, no chest pain, no dysuria and no fever     Past Medical History:  Diagnosis Date   CAD (coronary artery disease)    Cardiac arrest ([place]) 02/26/2022   CHF (congestive heart failure) (HCC)    STEMI (ST elevation myocardial infarction) ([place]) 02/26/2022    Patient Active Problem List   Diagnosis Date Noted   Cardiac arrest ([place]) 02/26/2022   STEMI (ST elevation myocardial infarction) ([place]) [id number]   Acute systolic heart failure (HCC)    Cardiogenic shock (HCC)     Past Surgical History:  Procedure Laterality Date   CORONARY STENT INTERVENTION N/A 02/26/2022   Procedure: CORONARY STENT INTERVENTION;  Surgeon: Belva Crome, MD;  Location: [facility];  Service: Cardiovascular;  Laterality: N/A;   HERNIA REPAIR     RIGHT/LEFT HEART CATH AND CORONARY ANGIOGRAPHY N/A 02/26/2022   Procedure: RIGHT/LEFT HEART CATH AND CORONARY ANGIOGRAPHY;  Surgeon: Belva Crome, MD;  Location: [facility];  Service: Cardiovascular;  Laterality: N/A;       Home Medications    Prior to Admission medications   Medication Sig Start Date End Date Taking? Authorizing Provider  naproxen (NAPROSYN) 500 MG tablet  Take 1 tablet (500 mg total) by mouth 2 (two) times daily for 10 days. 01/29/23 02/08/23 Yes Louretta Shorten, [place] N, FNP  predniSONE (STERAPRED UNI-PAK 21 TAB) 10 MG (21) TBPK tablet Take by mouth daily. Take 6 tabs by mouth daily  for 2 days, then 5 tabs for 2 days, then 4 tabs for 2 days, then 3 tabs for 2 days, 2 tabs for 2 days, then 1 tab by mouth daily for 2 days 01/29/23  Yes Louretta Shorten, [place] N, FNP  aspirin 81 MG chewable tablet Chew 1 tablet (81 mg total) by mouth daily. 03/02/22   Joette Catching, PA-C  atorvastatin (LIPITOR) 40 MG tablet Take 1 tablet (40 mg total) by mouth daily. 11/11/22   Joette Catching, PA-C  carvedilol (COREG) 3.125 MG tablet Take 1 tablet (3.125 mg total) by mouth 2 (two) times daily with a meal. 11/11/22   Joette Catching, PA-C  empagliflozin (JARDIANCE) 10 MG TABS tablet Take 1 tablet (10 mg total) by mouth daily. 03/02/22   Joette Catching, PA-C  nitroGLYCERIN (NITROSTAT) 0.4 MG SL tablet Place 1 tablet (0.4 mg total) under the tongue every 5 (five) minutes as needed for chest pain. 03/01/22 03/01/23  Joette Catching, PA-C  sacubitril-valsartan (ENTRESTO) 24-26 MG Take 1 tablet by mouth 2 (two) times daily. 04/28/22   Bensimhon, Quillian Quince  R, MD  spironolactone (ALDACTONE) 25 MG tablet Take 1 tablet (25 mg total) by mouth daily. 04/14/22   Bensimhon, Shaune Pascal, MD  ticagrelor (BRILINTA) 90 MG TABS tablet Take 1 tablet (90 mg total) by mouth 2 (two) times daily. 12/02/22   Bensimhon, Shaune Pascal, MD    Family History History reviewed. No pertinent family history.  Social History Social History   Tobacco Use   Smoking status: Some Days    Packs/day: 0.50    Years: 15.00    Additional pack years: 0.00    Total pack years: 7.50    Types: Cigarettes   Smokeless tobacco: Never  Vaping Use   Vaping Use: Never used  Substance Use Topics   Alcohol use: Yes    Comment: sometimes   Drug use: No     Allergies   Bee venom   Review of  Systems Review of Systems  Constitutional:  Negative for fever.  Respiratory:  Negative for cough and shortness of breath.   Cardiovascular:  Negative for chest pain.  Gastrointestinal:  Negative for abdominal pain.  Genitourinary:  Negative for difficulty urinating and dysuria.  Musculoskeletal:  Positive for back pain. Negative for gait problem, joint swelling and myalgias.     Physical Exam Triage Vital Signs ED Triage Vitals  Enc Vitals Group     BP 01/29/23 1542 118/84     Pulse Rate 01/29/23 1542 69     Resp 01/29/23 1542 16     Temp 01/29/23 1542 97.7 F (36.5 C)     Temp Source 01/29/23 1542 Oral     SpO2 01/29/23 1542 95 %     Weight --      Height --      Head Circumference --      Peak Flow --      Pain Score 01/29/23 1544 10     Pain Loc --      Pain Edu? --      Excl. in [place]? --    No data found.  Updated Vital Signs BP 118/84 (BP Location: Right Arm)   Pulse 69   Temp 97.7 F (36.5 C) (Oral)   Resp 16   SpO2 95%   Visual Acuity Right Eye Distance:   Left Eye Distance:   Bilateral Distance:    Right Eye Near:   Left Eye Near:    Bilateral Near:     Physical Exam Vitals and nursing note reviewed.  Constitutional:      Appearance: Normal appearance.  HENT:     Head: Normocephalic and atraumatic.  Cardiovascular:     Rate and Rhythm: Normal rate and regular rhythm.  Pulmonary:     Effort: Pulmonary effort is normal. No respiratory distress.  Musculoskeletal:        General: No swelling, tenderness, deformity or signs of injury. Normal range of motion.     Right lower leg: No edema.     Left lower leg: No edema.     Comments: Negative straight leg raise.  Skin:    General: Skin is warm and dry.     Capillary Refill: Capillary refill takes less than 2 seconds.  Neurological:     Mental Status: He is alert.      UC Treatments / Results  Labs (all labs ordered are listed, but only abnormal results are displayed) Labs Reviewed - No data  to display  EKG   Radiology No results found.  Procedures Procedures (including critical care time)  Medications Ordered in UC Medications  dexamethasone (DECADRON) injection 10 mg (10 mg Intramuscular Given 01/29/23 1607)    Initial Impression / Assessment and Plan / UC Course  I have reviewed the triage vital signs and the nursing notes.  Pertinent labs & imaging results that were available during my care of the patient were reviewed by me and considered in my medical decision making (see chart for details).  Vital signs and triage note reviewed, patient is hemodynamically stable.  Right hip pain that radiates down his right hamstring.  Negative straight leg raise in clinic.  Without saddle anesthesia, incontinence or other red flag symptoms.  Discussed etiology of lumbar radiculopathy, advised follow-up with orthopedic.  Will give steroid injection in clinic and cover with oral steroids and anti-inflammatories to help with pain and inflammation.  Patient verbalized understanding of treatment plan, follow-up care and emergency precautions discussed, no questions at this time.     Final Clinical Impressions(s) / UC Diagnoses   Final diagnoses:  Sciatica of right side     Discharge Instructions      We have given you a steroid injection in clinic, please start the steroid Dosepak tomorrow morning, take this with breakfast.  You can take the anti-inflammatories twice daily with food.  Please perform the exercise we discussed, there are exercises attached to this discharge paper as well.  If your pain and symptoms persist, call and schedule an appointment with Raliegh Ip on Monday.  Please return to clinic or seek immediate care if you develop any inner leg numbness, incontinence, inability to walk, or worsening of pain.     ED Prescriptions     Medication Sig Dispense Auth. Provider   predniSONE (STERAPRED UNI-PAK 21 TAB) 10 MG (21) TBPK tablet Take by mouth daily.  Take 6 tabs by mouth daily  for 2 days, then 5 tabs for 2 days, then 4 tabs for 2 days, then 3 tabs for 2 days, 2 tabs for 2 days, then 1 tab by mouth daily for 2 days 42 tablet Louretta Shorten, [place] N, FNP   naproxen (NAPROSYN) 500 MG tablet Take 1 tablet (500 mg total) by mouth 2 (two) times daily for 10 days. 20 tablet Antawn Sison, [place] N, [place]      I have reviewed the PDMP during this encounter.   Louretta Shorten [place] N, [place] 01/29/23 [phone]

## 2023-01-31 ENCOUNTER — Telehealth: Payer: Self-pay

## 2023-01-31 ENCOUNTER — Other Ambulatory Visit (HOSPITAL_COMMUNITY): Payer: Self-pay

## 2023-01-31 DIAGNOSIS — Z1211 Encounter for screening for malignant neoplasm of colon: Secondary | ICD-10-CM

## 2023-01-31 NOTE — Telephone Encounter (Signed)
Gastroenterology Pre-Procedure Review  Request Date: TBD Requesting Physician: Dr. Dellie Catholic  PATIENT REVIEW QUESTIONS: The patient responded to the following health history questions as indicated:    1. Are you having any GI issues? no 2. Do you have a personal history of Polyps? no 3. Do you have a family history of Colon Cancer or Polyps? no 4. Diabetes Mellitus? no 5. Joint replacements in the past 12 months?no 6. Major health problems in the past 3 months? Heart attack last April 2023. Cardiac Clearance sent to Dr. Haroldine Laws 7. Any artificial heart valves, MVP, or defibrillator?no    MEDICATIONS & ALLERGIES:    Patient reports the following regarding taking any anticoagulation/antiplatelet therapy:   Plavix, Coumadin, Eliquis, Xarelto, Lovenox, Pradaxa, Brilinta, or Effient? yes (Pt takes Brillinita Blood thinner advice requested on Cardiac clearance sent to Dr. Haroldine Laws) Aspirin? yes (81mg  daily)  Patient confirms/reports the following medications:  Current Outpatient Medications  Medication Sig Dispense Refill   aspirin 81 MG chewable tablet Chew 1 tablet (81 mg total) by mouth daily. 31 tablet 5   atorvastatin (LIPITOR) 40 MG tablet Take 1 tablet (40 mg total) by mouth daily. 30 tablet 5   carvedilol (COREG) 3.125 MG tablet Take 1 tablet (3.125 mg total) by mouth 2 (two) times daily with a meal. 62 tablet 5   empagliflozin (JARDIANCE) 10 MG TABS tablet Take 1 tablet (10 mg total) by mouth daily. 30 tablet 5   naproxen (NAPROSYN) 500 MG tablet Take 1 tablet (500 mg total) by mouth 2 (two) times daily for 10 days. 20 tablet 0   nitroGLYCERIN (NITROSTAT) 0.4 MG SL tablet Place 1 tablet (0.4 mg total) under the tongue every 5 (five) minutes as needed for chest pain. 25 tablet 0   predniSONE (STERAPRED UNI-PAK 21 TAB) 10 MG (21) TBPK tablet Take by mouth daily. Take 6 tabs by mouth daily  for 2 days, then 5 tabs for 2 days, then 4 tabs for 2 days, then 3 tabs for 2 days, 2 tabs for 2 days,  then 1 tab by mouth daily for 2 days 42 tablet 0   sacubitril-valsartan (ENTRESTO) 24-26 MG Take 1 tablet by mouth 2 (two) times daily. 60 tablet 3   spironolactone (ALDACTONE) 25 MG tablet Take 1 tablet (25 mg total) by mouth daily. 30 tablet 5   ticagrelor (BRILINTA) 90 MG TABS tablet Take 1 tablet (90 mg total) by mouth 2 (two) times daily. 180 tablet 3   No current facility-administered medications for this visit.    Patient confirms/reports the following allergies:  Allergies  Allergen Reactions   Bee Venom Hives    No orders of the defined types were placed in this encounter.   AUTHORIZATION INFORMATION Primary Insurance: 1D#: Group #:  Secondary Insurance: 1D#: Group #:  SCHEDULE INFORMATION: Date:  Time: Location: [place]

## 2023-02-07 ENCOUNTER — Telehealth (HOSPITAL_COMMUNITY): Payer: Self-pay

## 2023-02-07 NOTE — Telephone Encounter (Signed)
Received Cardiac Clearance form from [facility] requesting patient be cleared for the following procedure Colonoscopy with general anesthesia. Form was placed in [name], NP folder on 25/Mar/2024 for signature. Will update chart once clearance has been reviewed and signed by provider.

## 2023-02-07 NOTE — Telephone Encounter (Signed)
Medical clearance form was signed by Dr. Glori Bickers and successfully faxed to [phone] on Monday, March 25,. Form will be scanned into patients chart.

## 2023-02-10 ENCOUNTER — Other Ambulatory Visit (HOSPITAL_COMMUNITY): Payer: Self-pay

## 2023-02-11 ENCOUNTER — Encounter (HOSPITAL_COMMUNITY): Payer: Self-pay

## 2023-02-11 ENCOUNTER — Ambulatory Visit (HOSPITAL_COMMUNITY)
Admission: EM | Admit: 2023-02-11 | Discharge: 2023-02-11 | Disposition: A | Discharge status: Home / Self Care | Payer: Commercial Managed Care - HMO | Attending: Physician Assistant | Admitting: Physician Assistant

## 2023-02-11 DIAGNOSIS — G5701 Lesion of sciatic nerve, right lower limb: Secondary | ICD-10-CM

## 2023-02-11 DIAGNOSIS — M5431 Sciatica, right side: Secondary | ICD-10-CM

## 2023-02-11 MED ORDER — METHOCARBAMOL 500 MG PO TABS: 500.0000 mg | ORAL_TABLET | Freq: Two times a day (BID) | ORAL | 0 refills | Status: DC

## 2023-02-11 MED ORDER — LIDOCAINE 5 % EX PTCH
1.0000 | MEDICATED_PATCH | CUTANEOUS | 0 refills | Status: DC
Start: 2023-02-11 — End: 2023-04-06

## 2023-02-11 NOTE — ED Provider Notes (Signed)
[place]    CSN: [id number] Arrival date & time: 02/11/23  1949      History   Chief Complaint Chief Complaint  Patient presents with   Medication Refill    HPI Willie Campbell is a 52 y.o. male.   Patient presents today with a several month history of right-sided lower back and leg pain.  He was seen by our clinic on 01/20/2023 at which point he was prescribed tramadol and Robaxin.  He had some improvement but ran out of medication and was reevaluated by our clinic on 01/29/2023 which when he was given Naprosyn and prednisone taper after a dexamethasone injection during clinic visit.  He reports that he had been bending over and lifting a light box when symptoms began but denies any known injury or increase in activity prior to symptom onset.  He has not had any imaging but denies any recent trauma.  He reports pain is localized to his right buttocks with radiation into the posterior portion of his right leg.  Pain is rated 6 on a 0-10 pain scale, described as shooting/aching, no aggravating relieving factors identified.  He is not taking any over-the-counter medication for symptom management.  Denies any bowel/bladder incontinence, lower extremity weakness, saddle anesthesia.  Denies previous injury or surgery involving his back.  He has missed work as a result of symptoms.    Past Medical History:  Diagnosis Date   CAD (coronary artery disease)    Cardiac arrest ([place]) 02/26/2022   CHF (congestive heart failure) (HCC)    STEMI (ST elevation myocardial infarction) ([place]) 02/26/2022    Patient Active Problem List   Diagnosis Date Noted   Cardiac arrest ([place]) 02/26/2022   STEMI (ST elevation myocardial infarction) ([place]) [id number]   Acute systolic heart failure (HCC)    Cardiogenic shock (HCC)     Past Surgical History:  Procedure Laterality Date   CORONARY STENT INTERVENTION N/A 02/26/2022   Procedure: CORONARY STENT INTERVENTION;  Surgeon: Belva Crome, MD;   Location: [facility];  Service: Cardiovascular;  Laterality: N/A;   HERNIA REPAIR     RIGHT/LEFT HEART CATH AND CORONARY ANGIOGRAPHY N/A 02/26/2022   Procedure: RIGHT/LEFT HEART CATH AND CORONARY ANGIOGRAPHY;  Surgeon: Belva Crome, MD;  Location: [facility];  Service: Cardiovascular;  Laterality: N/A;       Home Medications    Prior to Admission medications   Medication Sig Start Date End Date Taking? Authorizing Provider  lidocaine (LIDODERM) 5 % Place 1 patch onto the skin daily. Remove & Discard patch within 12 hours or as directed by MD 02/11/23  Yes Frazier Balfour, Derry Skill, PA-C  methocarbamol (ROBAXIN) 500 MG tablet Take 1 tablet (500 mg total) by mouth 2 (two) times daily. 02/11/23  Yes Quinzell Malcomb, Derry Skill, PA-C  aspirin 81 MG chewable tablet Chew 1 tablet (81 mg total) by mouth daily. 03/02/22   Joette Catching, PA-C  atorvastatin (LIPITOR) 40 MG tablet Take 1 tablet (40 mg total) by mouth daily. 11/11/22   Joette Catching, PA-C  carvedilol (COREG) 3.125 MG tablet Take 1 tablet (3.125 mg total) by mouth 2 (two) times daily with a meal. 11/11/22   Joette Catching, PA-C  empagliflozin (JARDIANCE) 10 MG TABS tablet Take 1 tablet (10 mg total) by mouth daily. 03/02/22   Joette Catching, PA-C  nitroGLYCERIN (NITROSTAT) 0.4 MG SL tablet Place 1 tablet (0.4 mg total) under the tongue every 5 (five) minutes as needed  for chest pain. 03/01/22 03/01/23  Joette Catching, PA-C  predniSONE (STERAPRED UNI-PAK 21 TAB) 10 MG (21) TBPK tablet Take by mouth daily. Take 6 tabs by mouth daily  for 2 days, then 5 tabs for 2 days, then 4 tabs for 2 days, then 3 tabs for 2 days, 2 tabs for 2 days, then 1 tab by mouth daily for 2 days 01/29/23   Garrison, [place] N, FNP  sacubitril-valsartan (ENTRESTO) 24-26 MG Take 1 tablet by mouth 2 (two) times daily. 04/28/22   Bensimhon, Shaune Pascal, MD  spironolactone (ALDACTONE) 25 MG tablet Take 1 tablet (25 mg total) by mouth daily. 04/14/22    Bensimhon, Shaune Pascal, MD  ticagrelor (BRILINTA) 90 MG TABS tablet Take 1 tablet (90 mg total) by mouth 2 (two) times daily. 12/02/22   Bensimhon, Shaune Pascal, MD    Family History History reviewed. No pertinent family history.  Social History Social History   Tobacco Use   Smoking status: Some Days    Packs/day: 0.50    Years: 15.00    Additional pack years: 0.00    Total pack years: 7.50    Types: Cigarettes   Smokeless tobacco: Never  Vaping Use   Vaping Use: Never used  Substance Use Topics   Alcohol use: Yes    Comment: sometimes   Drug use: No     Allergies   Bee venom   Review of Systems Review of Systems  Constitutional:  Positive for activity change. Negative for appetite change, fatigue and fever.  Gastrointestinal:  Negative for abdominal pain, diarrhea, nausea and vomiting.  Genitourinary:  Negative for difficulty urinating and enuresis.  Musculoskeletal:  Positive for back pain. Negative for arthralgias and myalgias.  Neurological:  Negative for weakness and numbness.     Physical Exam Triage Vital Signs ED Triage Vitals  Enc Vitals Group     BP 02/11/23 2000 126/85     Pulse Rate 02/11/23 2000 82     Resp 02/11/23 2000 16     Temp 02/11/23 2000 (!) 97.5 F (36.4 C)     Temp Source 02/11/23 2000 Oral     SpO2 02/11/23 2000 95 %     Weight --      Height --      Head Circumference --      Peak Flow --      Pain Score 02/11/23 2001 6     Pain Loc --      Pain Edu? --      Excl. in [place]? --    No data found.  Updated Vital Signs BP 126/85 (BP Location: Left Arm)   Pulse 82   Temp (!) 97.5 F (36.4 C) (Oral)   Resp 16   SpO2 95%   Visual Acuity Right Eye Distance:   Left Eye Distance:   Bilateral Distance:    Right Eye Near:   Left Eye Near:    Bilateral Near:     Physical Exam Vitals reviewed.  Constitutional:      General: He is awake.     Appearance: Normal appearance. He is well-developed. He is not ill-appearing.     Comments:  Very pleasant male appears stated age in no acute distress sitting comfortably in exam room  HENT:     Head: Normocephalic and atraumatic.     Mouth/Throat:     Pharynx: No oropharyngeal exudate, posterior oropharyngeal erythema or uvula swelling.  Cardiovascular:     Rate and Rhythm: Normal rate  and regular rhythm.     Heart sounds: Normal heart sounds, S1 normal and S2 normal. No murmur heard. Pulmonary:     Effort: Pulmonary effort is normal.     Breath sounds: Normal breath sounds. No stridor. No wheezing, rhonchi or rales.     Comments: Clear to auscultation bilaterally Abdominal:     Palpations: Abdomen is soft.     Tenderness: There is no abdominal tenderness.  Musculoskeletal:     Cervical back: No spasms, tenderness or bony tenderness.     Thoracic back: No tenderness or bony tenderness.     Lumbar back: No tenderness or bony tenderness. Negative right straight leg raise test and negative left straight leg raise test.       Back:     Comments: Mild tenderness to palpation over right buttocks.  No pain percussion of vertebrae throughout spine.  No significant tenderness palpation or spasm noted of paraspinal muscles.  Negative straight leg raise bilaterally.  Negative Faber bilaterally.  Neurological:     Mental Status: He is alert.  Psychiatric:        Behavior: Behavior is cooperative.      UC Treatments / Results  Labs (all labs ordered are listed, but only abnormal results are displayed) Labs Reviewed - No data to display  EKG   Radiology No results found.  Procedures Procedures (including critical care time)  Medications Ordered in UC Medications - No data to display  Initial Impression / Assessment and Plan / UC Course  I have reviewed the triage vital signs and the nursing notes.  Pertinent labs & imaging results that were available during my care of the patient were reviewed by me and considered in my medical decision making (see chart for  details).     Patient is well-appearing, afebrile, nontoxic, nontachycardic.  Plain films were deferred as he has no focal bony tenderness and denies any recent trauma.  Concern for piriformis syndrome as etiology of symptoms.  Discussed that given his history of cardiovascular disease we should avoid NSAIDs and recurrent use of steroids.  He was started on Robaxin twice a day with instruction to drive or drink alcohol with taking this medication as drowsiness is a common side effect.  Can use over-the-counter acetaminophen/Tylenol for breakthrough pain as well as lidocaine patch for specific area pain relief.  Discussed that ultimately he will likely need to see physical therapist and encouraged him to follow-up with orthopedics to arrange additional evaluation.  He was given contact information for local provider with instruction to call and schedule appointment soon as possible.  Discussed that if he has any worsening or changing symptoms including increasing pain, difficulty ambulating, bowel/bladder incontinence, lower extremity weakness he needs to be seen immediately.  Should return precautions given.  Work excuse note provided.  Final Clinical Impressions(s) / UC Diagnoses   Final diagnoses:  Piriformis syndrome of right side  Sciatica of right side     Discharge Instructions      Please use heat and gentle stretch to help with your symptoms.  Use lidocaine patch on the affected area.  Take Robaxin twice a day.  This make you sleepy so do not drive or drink alcohol with taking it.  Is importantly follow-up with orthopedics so please call them to schedule an appointment.  If you have any worsening symptoms including difficulty walking, increasing pain, going to the bathroom on yourself without noticing it, numbness or tingling in your legs you need to be seen  immediately.    ED Prescriptions     Medication Sig Dispense Auth. Provider   lidocaine (LIDODERM) 5 % Place 1 patch onto the  skin daily. Remove & Discard patch within 12 hours or as directed by MD 14 patch Alayssa Flinchum K, PA-C   methocarbamol (ROBAXIN) 500 MG tablet Take 1 tablet (500 mg total) by mouth 2 (two) times daily. 20 tablet Diyana Starrett, Derry Skill, PA-C      PDMP not reviewed this encounter.   Terrilee Croak, PA-C 02/11/23 2055

## 2023-02-11 NOTE — ED Triage Notes (Signed)
Patient states he has right hip and states the pain radiates into the right leg. Patient states it is at its worse  in the mornings , but when he walks during the day it is much less.   Patient states he needs a work note and a pain medication refill.

## 2023-02-11 NOTE — Discharge Instructions (Signed)
Please use heat and gentle stretch to help with your symptoms.  Use lidocaine patch on the affected area.  Take Robaxin twice a day.  This make you sleepy so do not drive or drink alcohol with taking it.  Is importantly follow-up with orthopedics so please call them to schedule an appointment.  If you have any worsening symptoms including difficulty walking, increasing pain, going to the bathroom on yourself without noticing it, numbness or tingling in your legs you need to be seen immediately.

## 2023-02-16 ENCOUNTER — Other Ambulatory Visit (HOSPITAL_COMMUNITY): Payer: Self-pay

## 2023-02-18 ENCOUNTER — Other Ambulatory Visit (HOSPITAL_COMMUNITY): Payer: Self-pay | Admitting: Internal Medicine

## 2023-02-18 ENCOUNTER — Other Ambulatory Visit (HOSPITAL_COMMUNITY): Payer: Self-pay

## 2023-02-19 ENCOUNTER — Encounter (HOSPITAL_COMMUNITY): Payer: Self-pay

## 2023-02-19 ENCOUNTER — Ambulatory Visit (HOSPITAL_COMMUNITY)
Admission: EM | Admit: 2023-02-19 | Discharge: 2023-02-19 | Disposition: A | Discharge status: Home / Self Care | Payer: Medicaid Other | Attending: Family Medicine | Admitting: Family Medicine

## 2023-02-19 DIAGNOSIS — M5441 Lumbago with sciatica, right side: Secondary | ICD-10-CM | POA: Diagnosis not present

## 2023-02-19 MED ORDER — NAPROXEN 500 MG PO TABS: 500.0000 mg | ORAL_TABLET | Freq: Two times a day (BID) | ORAL | 0 refills | Status: DC

## 2023-02-19 MED ORDER — TIZANIDINE HCL 4 MG PO TABS
4.0000 mg | ORAL_TABLET | Freq: Three times a day (TID) | ORAL | 0 refills | Status: DC | PRN
Start: 2023-02-19 — End: 2023-04-06

## 2023-02-19 NOTE — ED Provider Notes (Signed)
[facility]    CSN: [id number] Arrival date & time: 02/19/23  1726      History   Chief Complaint Chief Complaint  Patient presents with   Hip Pain    HPI Willie Campbell is a 52 y.o. male.   Patient presents today with recurrent lower back pain.  He was seen by our clinic on 02/11/2023 at which point he was prescribed methocarbamol for suspected piriformis syndrome.  He reports his pain did improve but has recurred over the past several days and he is not having difficulty ambulating as result of the pain.  He has missed work due to the symptoms.  He reports that pain is rated 6 on a 0-10 pain scale, described as aching with shooting pain into his right leg, no aggravating relieving factors notified.  He denies any recent injury or trauma prior to symptoms beginning.  He has not been taking any over-the-counter medication for symptom management.  He has never seen orthopedics.  Denies any bowel/bladder incontinence, lower extremity weakness, saddle anesthesia.  Denies any personal history of malignancy.    Past Medical History:  Diagnosis Date   CAD (coronary artery disease)    Cardiac arrest 02/26/2022   CHF (congestive heart failure)    STEMI (ST elevation myocardial infarction) 02/26/2022    Patient Active Problem List   Diagnosis Date Noted   Cardiac arrest 02/26/2022   STEMI (ST elevation myocardial infarction) 02/26/2022   Acute systolic heart failure    Cardiogenic shock     Past Surgical History:  Procedure Laterality Date   CORONARY STENT INTERVENTION N/A 02/26/2022   Procedure: CORONARY STENT INTERVENTION;  Surgeon: Lyn RecordsSmith, Henry W, MD;  [facility];  Service: Cardiovascular;  Laterality: N/A;   HERNIA REPAIR     RIGHT/LEFT HEART CATH AND CORONARY ANGIOGRAPHY N/A 02/26/2022   Procedure: RIGHT/LEFT HEART CATH AND CORONARY ANGIOGRAPHY;  Surgeon: Lyn RecordsSmith, Henry W, MD;  [facility];  Service: Cardiovascular;  Laterality: N/A;        Home Medications    Prior to Admission medications   Medication Sig Start Date End Date Taking? Authorizing Provider  aspirin 81 MG chewable tablet Chew 1 tablet (81 mg total) by mouth daily. 03/02/22  Yes Andrey FarmerFinch, Lindsay Nicole, PA-C  atorvastatin (LIPITOR) 40 MG tablet Take 1 tablet (40 mg total) by mouth daily. 11/11/22  Yes Andrey FarmerFinch, Lindsay Nicole, PA-C  carvedilol (COREG) 3.125 MG tablet Take 1 tablet (3.125 mg total) by mouth 2 (two) times daily with a meal. 11/11/22  Yes Andrey FarmerFinch, Lindsay Nicole, PA-C  empagliflozin (JARDIANCE) 10 MG TABS tablet Take 1 tablet (10 mg total) by mouth daily. 03/02/22  Yes Andrey FarmerFinch, Lindsay Nicole, PA-C  lidocaine (LIDODERM) 5 % Place 1 patch onto the skin daily. Remove & Discard patch within 12 hours or as directed by MD 02/11/23  Yes Macauley Mossberg K, PA-C  sacubitril-valsartan (ENTRESTO) 24-26 MG Take 1 tablet by mouth 2 (two) times daily. 04/28/22  Yes Bensimhon, Bevelyn Bucklesaniel R, MD  spironolactone (ALDACTONE) 25 MG tablet Take 1 tablet (25 mg total) by mouth daily. 04/14/22  Yes Bensimhon, Bevelyn Bucklesaniel R, MD  ticagrelor (BRILINTA) 90 MG TABS tablet Take 1 tablet (90 mg total) by mouth 2 (two) times daily. 12/02/22  Yes Bensimhon, Bevelyn Bucklesaniel R, MD  tiZANidine (ZANAFLEX) 4 MG tablet Take 1 tablet (4 mg total) by mouth every 8 (eight) hours as needed for muscle spasms. 02/19/23  Yes Kaleesi Guyton K, PA-C  nitroGLYCERIN (NITROSTAT)  0.4 MG SL tablet Place 1 tablet (0.4 mg total) under the tongue every 5 (five) minutes as needed for chest pain. 03/01/22 03/01/23  Andrey Farmer, PA-C    Family History History reviewed. No pertinent family history.  Social History Social History   Tobacco Use   Smoking status: Some Days    Packs/day: 0.50    Years: 15.00    Additional pack years: 0.00    Total pack years: 7.50    Types: Cigarettes   Smokeless tobacco: Never  Vaping Use   Vaping Use: Never used  Substance Use Topics   Alcohol use: Yes    Comment: sometimes   Drug use:  No     Allergies   Bee venom   Review of Systems Review of Systems  Constitutional:  Positive for activity change. Negative for appetite change, fatigue and fever.  Musculoskeletal:  Positive for back pain. Negative for arthralgias and myalgias.  Neurological:  Negative for weakness and numbness.     Physical Exam Triage Vital Signs ED Triage Vitals [02/19/23 1742]  Enc Vitals Group     BP (!) 124/91     Pulse Rate (!) 102     Resp 18     Temp 98.1 F (36.7 C)     Temp Source Oral     SpO2 99 %     Weight      Height      Head Circumference      Peak Flow      Pain Score      Pain Loc      Pain Edu?      Excl. in GC?    No data found.  Updated Vital Signs BP (!) 124/91 (BP Location: Left Arm)   Pulse 91   Temp 98.1 F (36.7 C) (Oral)   Resp 18   SpO2 96%   Visual Acuity Right Eye Distance:   Left Eye Distance:   Bilateral Distance:    Right Eye Near:   Left Eye Near:    Bilateral Near:     Physical Exam Vitals reviewed.  Constitutional:      General: He is awake.     Appearance: Normal appearance. He is well-developed. He is not ill-appearing.     Comments: Very pleasant male appears stated age in no acute distress sitting comfortably in exam room  HENT:     Head: Normocephalic and atraumatic.     Mouth/Throat:     Pharynx: Uvula midline. No oropharyngeal exudate or posterior oropharyngeal erythema.  Cardiovascular:     Rate and Rhythm: Normal rate and regular rhythm.     Heart sounds: Normal heart sounds, S1 normal and S2 normal. No murmur heard. Pulmonary:     Effort: Pulmonary effort is normal.     Breath sounds: Normal breath sounds. No stridor. No wheezing, rhonchi or rales.     Comments: Clear to auscultation bilaterally Abdominal:     General: Bowel sounds are normal.     Palpations: Abdomen is soft.     Tenderness: There is no abdominal tenderness.  Musculoskeletal:     Cervical back: No tenderness or bony tenderness.     Thoracic  back: No tenderness or bony tenderness.     Lumbar back: Tenderness present. No spasms or bony tenderness. Negative right straight leg raise test and negative left straight leg raise test.     Comments: Mild tenderness to palpation over right lumbar paraspinal muscles.  No pain percussion of  vertebrae or deformity noted.  Normal active range of motion.  Neurological:     Mental Status: He is alert.  Psychiatric:        Behavior: Behavior is cooperative.      UC Treatments / Results  Labs (all labs ordered are listed, but only abnormal results are displayed) Labs Reviewed - No data to display  EKG   Radiology No results found.  Procedures Procedures (including critical care time)  Medications Ordered in UC Medications - No data to display  Initial Impression / Assessment and Plan / UC Course  I have reviewed the triage vital signs and the nursing notes.  Pertinent labs & imaging results that were available during my care of the patient were reviewed by me and considered in my medical decision making (see chart for details).     Patient is well-appearing, afebrile, nontoxic, nontachycardic.  No indication for plain films as he denies any recent trauma and has no bony tenderness.  Suspect flare of sciatica causing his symptoms.  Recommended that he follow-up with sports medicine and was given contact information for local provider with instruction to call to schedule an appointment.  He was started on tizanidine with instruction to take this up to 3 times a day as needed.  This can be sedating send to drive drink alcohol while taking this.  Initially he was prescribed Naprosyn but after review of chart realized that he has a history of cardiovascular disease.  Called and canceled this prescription with the pharmacy.  I called and told the patient that he should not take this medication and that it will not be available at his pharmacy.  He expressed understanding.  Discussed that if  he has any worsening or changing symptoms he needs to be seen immediately including increasing pain, bowel/bladder incontinence, lower extremity weakness.  Strict return precautions given.  Work excuse note provided per his request.  Final Clinical Impressions(s) / UC Diagnoses   Final diagnoses:  Acute right-sided low back pain with right-sided sciatica     Discharge Instructions      Take Naprosyn twice a day.  Do not take NSAIDs with this medication including aspirin, ibuprofen/Advil, naproxen/Aleve.  You can take a baby aspirin but should not take additional aspirin.  Take tizanidine up to 3 times a day.  This make you sleepy so do not drive or drink alcohol with taking it.  Use heat and gentle stretch for symptom relief.  I would like you to follow-up with sports medicine.  Please call them to schedule an appointment.  If you have any worsening or changing symptoms please return for reevaluation.    ED Prescriptions     Medication Sig Dispense Auth. Provider   naproxen (NAPROSYN) 500 MG tablet  (Status: Discontinued) Take 1 tablet (500 mg total) by mouth 2 (two) times daily. 20 tablet Yeiren Whitecotton K, PA-C   tiZANidine (ZANAFLEX) 4 MG tablet Take 1 tablet (4 mg total) by mouth every 8 (eight) hours as needed for muscle spasms. 30 tablet Adyline Huberty, Noberto Retort, PA-C      PDMP not reviewed this encounter.   Jeani Hawking, PA-C 02/19/23 1828

## 2023-02-19 NOTE — Discharge Instructions (Addendum)
Take Naprosyn twice a day.  Do not take NSAIDs with this medication including aspirin, ibuprofen/Advil, naproxen/Aleve.  You can take a baby aspirin but should not take additional aspirin.  Take tizanidine up to 3 times a day.  This make you sleepy so do not drive or drink alcohol with taking it.  Use heat and gentle stretch for symptom relief.  I would like you to follow-up with sports medicine.  Please call them to schedule an appointment.  If you have any worsening or changing symptoms please return for reevaluation.

## 2023-02-19 NOTE — ED Triage Notes (Signed)
Pt states he is here for a doctor's note. Pt stated his right hip is still hurting.

## 2023-02-21 ENCOUNTER — Other Ambulatory Visit (HOSPITAL_COMMUNITY): Payer: Self-pay

## 2023-02-21 MED ORDER — SPIRONOLACTONE 25 MG PO TABS
25.0000 mg | ORAL_TABLET | Freq: Every day | ORAL | 5 refills | Status: DC
  Filled 2023-02-21: qty 90, 90d supply, fill #0
  Filled 2023-03-26: qty 90, 90d supply, fill #1

## 2023-02-22 ENCOUNTER — Other Ambulatory Visit (HOSPITAL_COMMUNITY): Payer: Self-pay

## 2023-02-24 ENCOUNTER — Other Ambulatory Visit (HOSPITAL_COMMUNITY): Payer: Self-pay

## 2023-02-24 ENCOUNTER — Other Ambulatory Visit: Payer: Self-pay

## 2023-02-24 DIAGNOSIS — Z1211 Encounter for screening for malignant neoplasm of colon: Secondary | ICD-10-CM

## 2023-02-24 MED ORDER — NA SULFATE-K SULFATE-MG SULF 17.5-3.13-1.6 GM/177ML PO SOLN: 1.0000 | Freq: Once | ORAL | 0 refills | Status: AC

## 2023-02-24 NOTE — Telephone Encounter (Signed)
Received Clearance from Dr. Garth Schlatter.  Patient has been cleared to have colonoscopy.  Dr. Gala Romney has advised to stop taking Brilinta 5 days prior to colonoscopy and restart ASAP.  Colonoscopy has been scheduled for 03/28/23.  Brilinta stop date 05/08.  Jardiance stop date 05/10.  Patient advised of this and has been noted in his colonoscopy instructions.  Thanks, [place], [place]

## 2023-02-24 NOTE — Telephone Encounter (Signed)
02/07/23 10:39 AM Note Medical clearance form was signed by Dr. Arvilla Meres and successfully faxed to [phone] on Monday, March 25,. Form will be scanned into patients chart.      Cardiac clearance was granted.  Contacted patient to request call back to schedule colonoscopy.  Thanks, [place], [place]

## 2023-02-24 NOTE — Addendum Note (Signed)
Addended by: Avie Arenas on: 02/24/2023 10:23 AM   Modules accepted: Orders

## 2023-02-25 ENCOUNTER — Other Ambulatory Visit (HOSPITAL_COMMUNITY): Payer: Self-pay

## 2023-02-25 ENCOUNTER — Telehealth (HOSPITAL_COMMUNITY): Payer: Self-pay | Admitting: Pharmacy Technician

## 2023-02-25 NOTE — Telephone Encounter (Signed)
Advanced Heart Failure Patient Advocate Encounter  Prior Authorization for [place] has been approved.    PA# [id number] Effective dates: 02/25/23 through 02/25/24  Archer Asa, CPhT

## 2023-02-25 NOTE — Telephone Encounter (Signed)
Patient Advocate Encounter   Received notification from [facility] that prior authorization for [place] is required.   PA submitted on CoverMyMeds Key  [id number] Status is pending   Will continue to follow.

## 2023-03-07 NOTE — Telephone Encounter (Signed)
Cardiac Clearance granted from Dr. Gala Romney.  Located in Media section of chart dated 01/25/23.  Thanks, [place], [place]

## 2023-03-11 ENCOUNTER — Other Ambulatory Visit (HOSPITAL_COMMUNITY): Payer: Self-pay

## 2023-03-19 DIAGNOSIS — R1032 Left lower quadrant pain: Secondary | ICD-10-CM | POA: Diagnosis not present

## 2023-03-22 ENCOUNTER — Other Ambulatory Visit (HOSPITAL_COMMUNITY): Payer: Self-pay

## 2023-03-22 ENCOUNTER — Telehealth (HOSPITAL_COMMUNITY): Payer: Self-pay | Admitting: Pharmacy Technician

## 2023-03-22 MED ORDER — TICAGRELOR 90 MG PO TABS
90.0000 mg | ORAL_TABLET | Freq: Two times a day (BID) | ORAL | 3 refills | Status: AC
  Filled 2023-03-22 – 2023-09-01 (×3): qty 180, 90d supply, fill #0

## 2023-03-22 NOTE — Telephone Encounter (Signed)
Advanced Heart Failure Patient Advocate Encounter  Received Brilinta renewal application from [employer]. Patient has managed medicaid now. Current 30 day co-pay, $4. We will not seek renewal at this time. Sent 90 day RX request to [facility] ([facility] to send to [facility] outpatient.   Archer Asa, CPhT

## 2023-03-26 ENCOUNTER — Other Ambulatory Visit (HOSPITAL_COMMUNITY): Payer: Self-pay | Admitting: Physician Assistant

## 2023-03-28 ENCOUNTER — Encounter: Payer: Self-pay | Admitting: Anesthesiology

## 2023-03-28 ENCOUNTER — Ambulatory Visit: Admission: RE | Admit: 2023-03-28 | Payer: Medicaid Other | Source: Home / Self Care | Admitting: Gastroenterology

## 2023-03-28 ENCOUNTER — Other Ambulatory Visit (HOSPITAL_COMMUNITY): Payer: Self-pay

## 2023-03-28 ENCOUNTER — Encounter: Admission: RE | Payer: Self-pay | Source: Home / Self Care

## 2023-03-28 ENCOUNTER — Other Ambulatory Visit: Payer: Self-pay

## 2023-03-28 SURGERY — COLONOSCOPY WITH PROPOFOL
Anesthesia: General

## 2023-03-28 MED ORDER — NITROGLYCERIN 0.4 MG SL SUBL
0.4000 mg | SUBLINGUAL_TABLET | SUBLINGUAL | 0 refills | Status: DC | PRN
  Filled 2023-03-28 – 2023-04-06 (×2): qty 25, 15d supply, fill #0
  Filled 2023-04-06: qty 25, 5d supply, fill #0

## 2023-03-28 MED ORDER — ASPIRIN 81 MG PO CHEW
81.0000 mg | CHEWABLE_TABLET | Freq: Every day | ORAL | 0 refills | Status: DC
  Filled 2023-03-28 – 2023-09-01 (×2): qty 60, 60d supply, fill #0

## 2023-03-28 MED ORDER — EMPAGLIFLOZIN 10 MG PO TABS
10.0000 mg | ORAL_TABLET | Freq: Every day | ORAL | 0 refills | Status: DC
  Filled 2023-03-28 – 2023-09-01 (×3): qty 60, 60d supply, fill #0

## 2023-03-28 NOTE — Anesthesia Preprocedure Evaluation (Deleted)
Anesthesia Evaluation    Airway Mallampati: III       Dental   Pulmonary Current Smoker          Cardiovascular + CAD (s/p MI and stents on Brilinta) and +CHF    ECG 01/20/23: Normal sinus rhythm Anteroseptal infarct , age undetermined T wave abnormality, consider lateral ischemia   Neuro/Psych    GI/Hepatic   Endo/Other    Renal/GU      Musculoskeletal   Abdominal   Peds  Hematology   Anesthesia Other Findings   Reproductive/Obstetrics                             Anesthesia Physical Anesthesia Plan  ASA: 3  Anesthesia Plan: General   Post-op Pain Management:    Induction: Intravenous  PONV Risk Score and Plan: 1 and Propofol infusion, TIVA and Treatment may vary due to age or medical condition  Airway Management Planned: Natural Airway  Additional Equipment:   Intra-op Plan:   Post-operative Plan:   Informed Consent:   Plan Discussed with:   Anesthesia Plan Comments: (No show on DOS.)        Anesthesia Quick Evaluation

## 2023-04-01 ENCOUNTER — Other Ambulatory Visit (HOSPITAL_COMMUNITY): Payer: Self-pay

## 2023-04-04 ENCOUNTER — Other Ambulatory Visit (HOSPITAL_COMMUNITY): Payer: Self-pay

## 2023-04-05 ENCOUNTER — Other Ambulatory Visit (HOSPITAL_COMMUNITY): Payer: Self-pay

## 2023-04-06 ENCOUNTER — Ambulatory Visit (HOSPITAL_COMMUNITY)
Admission: EM | Admit: 2023-04-06 | Discharge: 2023-04-06 | Disposition: A | Discharge status: Home / Self Care | Payer: Medicaid Other

## 2023-04-06 ENCOUNTER — Encounter (HOSPITAL_COMMUNITY): Payer: Self-pay | Admitting: *Deleted

## 2023-04-06 ENCOUNTER — Other Ambulatory Visit (HOSPITAL_COMMUNITY): Payer: Self-pay

## 2023-04-06 DIAGNOSIS — Z0289 Encounter for other administrative examinations: Secondary | ICD-10-CM

## 2023-04-06 DIAGNOSIS — K529 Noninfective gastroenteritis and colitis, unspecified: Secondary | ICD-10-CM

## 2023-04-06 NOTE — ED Provider Notes (Signed)
[facility]    CSN: [id number] Arrival date & time: 04/06/23  1510     History   Chief Complaint Chief Complaint  Patient presents with   Letter for School/Work    HPI Willie Campbell is a 52 y.o. male.  Here for work note Yesterday ate something and had several episodes of vomiting.  A little diarrhea.  At first he was having some abdominal cramping but that resolved last night. He has had no symptoms today.  He is able to tolerate fluids.  No lingering symptoms. He is requesting a note to return to work as his job will not let him back without note  Past Medical History:  Diagnosis Date   CAD (coronary artery disease)    Cardiac arrest (HCC) 02/26/2022   CHF (congestive heart failure) (HCC)    STEMI (ST elevation myocardial infarction) (HCC) 02/26/2022    Patient Active Problem List   Diagnosis Date Noted   Cardiac arrest (HCC) 02/26/2022   STEMI (ST elevation myocardial infarction) (HCC) 02/26/2022   Acute systolic heart failure (HCC)    Cardiogenic shock (HCC)     Past Surgical History:  Procedure Laterality Date   CORONARY STENT INTERVENTION N/A 02/26/2022   Procedure: CORONARY STENT INTERVENTION;  Surgeon: Lyn Records, MD;  [facility];  Service: Cardiovascular;  Laterality: N/A;   HERNIA REPAIR     RIGHT/LEFT HEART CATH AND CORONARY ANGIOGRAPHY N/A 02/26/2022   Procedure: RIGHT/LEFT HEART CATH AND CORONARY ANGIOGRAPHY;  Surgeon: Lyn Records, MD;  [facility];  Service: Cardiovascular;  Laterality: N/A;       Home Medications    Prior to Admission medications   Medication Sig Start Date End Date Taking? Authorizing Provider  aspirin (ASPIRIN LOW DOSE) 81 MG chewable tablet Chew 1 tablet (81 mg total) by mouth daily. NEEDS FOLLOW UP APPOINTMENT FOR ANYMORE REFILLS 03/28/23  Yes Andrey Farmer, PA-C  atorvastatin (LIPITOR) 40 MG tablet Take 1 tablet (40 mg total) by mouth daily. 11/11/22  Yes Andrey Farmer, PA-C  carvedilol (COREG) 3.125 MG tablet Take 1 tablet (3.125 mg total) by mouth 2 (two) times daily with a meal. 11/11/22  Yes Andrey Farmer, PA-C  empagliflozin (JARDIANCE) 10 MG TABS tablet Take 1 tablet (10 mg total) by mouth daily. NEEDS FOLLOW UP APPOINTMENT FOR MORE REFILLS 03/28/23  Yes Andrey Farmer, PA-C  sacubitril-valsartan (ENTRESTO) 24-26 MG Take 1 tablet by mouth 2 (two) times daily. 04/28/22  Yes Bensimhon, Bevelyn Buckles, MD  spironolactone (ALDACTONE) 25 MG tablet Take 1 tablet (25 mg total) by mouth daily. 02/21/23  Yes Bensimhon, Bevelyn Buckles, MD  ticagrelor (BRILINTA) 90 MG TABS tablet Take 1 tablet (90 mg total) by mouth 2 (two) times daily. 03/22/23  Yes Bensimhon, Bevelyn Buckles, MD  nitroGLYCERIN (NITROSTAT) 0.4 MG SL tablet Place 1 tablet (0.4 mg total) under the tongue every 5 (five) minutes as needed for chest pain. NEEDS FOLLOW UP APPOINTMENT FOR MORE REFILLS 03/28/23 03/27/24  Andrey Farmer, PA-C    Family History History reviewed. No pertinent family history.  Social History Social History   Tobacco Use   Smoking status: Some Days    Packs/day: 0.50    Years: 15.00    Additional pack years: 0.00    Total pack years: 7.50    Types: Cigarettes   Smokeless tobacco: Never  Vaping Use   Vaping Use: Never used  Substance Use Topics   Alcohol  use: Yes    Comment: occasionally   Drug use: No     Allergies   Bee venom   Review of Systems Review of Systems As per HPI  Physical Exam Triage Vital Signs ED Triage Vitals  Enc Vitals Group     BP 04/06/23 1610 116/68     Pulse Rate 04/06/23 1610 91     Resp 04/06/23 1610 16     Temp 04/06/23 1610 98.8 F (37.1 C)     Temp Source 04/06/23 1610 Oral     SpO2 04/06/23 1610 94 %     Weight --      Height --      Head Circumference --      Peak Flow --      Pain Score 04/06/23 1612 0     Pain Loc --      Pain Edu? --      Excl. in GC? --    No data found.  Updated Vital Signs BP 116/68    Pulse 91   Temp 98.8 F (37.1 C) (Oral)   Resp 16   SpO2 94%    Physical Exam Vitals and nursing note reviewed.  Constitutional:      General: He is not in acute distress.    Appearance: Normal appearance.  HENT:     Mouth/Throat:     Pharynx: Oropharynx is clear.  Cardiovascular:     Rate and Rhythm: Normal rate and regular rhythm.     Pulses: Normal pulses.     Heart sounds: Normal heart sounds.  Pulmonary:     Effort: Pulmonary effort is normal.     Breath sounds: Normal breath sounds.  Abdominal:     Tenderness: There is no abdominal tenderness.  Neurological:     Mental Status: He is alert and oriented to person, place, and time.    UC Treatments / Results  Labs (all labs ordered are listed, but only abnormal results are displayed) Labs Reviewed - No data to display  EKG   Radiology No results found.  Procedures Procedures (including critical care time)  Medications Ordered in UC Medications - No data to display  Initial Impression / Assessment and Plan / UC Course  I have reviewed the triage vital signs and the nursing notes.  Pertinent labs & imaging results that were available during my care of the patient were reviewed by me and considered in my medical decision making (see chart for details).  Stable vitals, well appearing Reporting all symptoms have resolved, no concerns today. Work note provided. Return precautions discussed   Final Clinical Impressions(s) / UC Diagnoses   Final diagnoses:  Encounter to obtain excuse from work  Gastroenteritis     Discharge Instructions      Drink lots of fluids over the next several days!  Please return if needed.    ED Prescriptions   None    PDMP not reviewed this encounter.   Abbi Mancini, Lurena Joiner, [place] 04/06/23 1714

## 2023-04-06 NOTE — ED Triage Notes (Signed)
Pt reports yesterday "felt like my testicle was heavy", along with some described abd cramping, then started vomiting. States after he vomited, he felt better. States feels completely back to normal today, but needs a work excuse.

## 2023-04-06 NOTE — Discharge Instructions (Signed)
Drink lots of fluids over the next several days!  Please return if needed.

## 2023-04-13 ENCOUNTER — Other Ambulatory Visit (HOSPITAL_COMMUNITY): Payer: Self-pay

## 2023-04-13 ENCOUNTER — Telehealth (HOSPITAL_COMMUNITY): Payer: Self-pay | Admitting: Pharmacy Technician

## 2023-04-13 NOTE — Telephone Encounter (Signed)
Advanced Heart Failure Patient Advocate Encounter  Received a renewal application for [employer] assistance through [employer]. The patient is currently insured under a managed medicaid plan. Looks like he has been using our outpatient pharmacy and the co-pay is $4. Will not seek assistance renewal at this time.  Archer Asa, CPhT

## 2023-04-15 ENCOUNTER — Encounter (HOSPITAL_COMMUNITY): Payer: Self-pay | Admitting: *Deleted

## 2023-04-15 ENCOUNTER — Ambulatory Visit (HOSPITAL_COMMUNITY)
Admission: EM | Admit: 2023-04-15 | Discharge: 2023-04-15 | Disposition: A | Discharge status: Home / Self Care | Payer: Medicaid Other | Attending: Emergency Medicine | Admitting: Emergency Medicine

## 2023-04-15 DIAGNOSIS — Z139 Encounter for screening, unspecified: Secondary | ICD-10-CM | POA: Diagnosis not present

## 2023-04-15 NOTE — ED Triage Notes (Signed)
Pt states he had some heart flutters yesterday and started sweating none today but he works 3rd shift and needs a note for work last night. He states he didn't take any meds just drank some water and tried to not think about it.    He states it wasn't bad enough to take a nitro.

## 2023-04-15 NOTE — Discharge Instructions (Signed)
I have provided you with a work note.  Is important that you follow-up with your primary care and with your cardiologist for further evaluation.  If you develop any chest pain, shortness of breath or irregular heartbeat, please call 911 or go to the nearest emergency department immediately.

## 2023-04-15 NOTE — ED Provider Notes (Addendum)
[facility]    CSN: [id number] Arrival date & time: 04/15/23  1950      History   Chief Complaint Chief Complaint  Patient presents with   Letter for School/Work   Irregular Heart Beat    HPI Willie Campbell is a 52 y.o. male.   Patient presents to clinic requesting a work note.  He reports his job is stressful and he felt his heart fluttering yesterday.  He works third shift and just needs a note.  Denies any current chest pain, palpitations, shortness of breath, fevers or cough.  The history is provided by the patient and medical records.    Past Medical History:  Diagnosis Date   CAD (coronary artery disease)    Cardiac arrest (HCC) 02/26/2022   CHF (congestive heart failure) (HCC)    STEMI (ST elevation myocardial infarction) (HCC) 02/26/2022    Patient Active Problem List   Diagnosis Date Noted   Cardiac arrest (HCC) 02/26/2022   STEMI (ST elevation myocardial infarction) (HCC) 02/26/2022   Acute systolic heart failure (HCC)    Cardiogenic shock (HCC)     Past Surgical History:  Procedure Laterality Date   CORONARY STENT INTERVENTION N/A 02/26/2022   Procedure: CORONARY STENT INTERVENTION;  Surgeon: Lyn Records, MD;  [facility];  Service: Cardiovascular;  Laterality: N/A;   HERNIA REPAIR     RIGHT/LEFT HEART CATH AND CORONARY ANGIOGRAPHY N/A 02/26/2022   Procedure: RIGHT/LEFT HEART CATH AND CORONARY ANGIOGRAPHY;  Surgeon: Lyn Records, MD;  [facility];  Service: Cardiovascular;  Laterality: N/A;       Home Medications    Prior to Admission medications   Medication Sig Start Date End Date Taking? Authorizing Provider  aspirin (ASPIRIN LOW DOSE) 81 MG chewable tablet Chew 1 tablet (81 mg total) by mouth daily. NEEDS FOLLOW UP APPOINTMENT FOR ANYMORE REFILLS 03/28/23  Yes Andrey Farmer, PA-C  atorvastatin (LIPITOR) 40 MG tablet Take 1 tablet (40 mg total) by mouth daily. 11/11/22  Yes Andrey Farmer, PA-C  carvedilol (COREG) 3.125 MG tablet Take 1 tablet (3.125 mg total) by mouth 2 (two) times daily with a meal. 11/11/22  Yes Andrey Farmer, PA-C  empagliflozin (JARDIANCE) 10 MG TABS tablet Take 1 tablet (10 mg total) by mouth daily. NEEDS FOLLOW UP APPOINTMENT FOR MORE REFILLS 03/28/23  Yes Andrey Farmer, PA-C  sacubitril-valsartan (ENTRESTO) 24-26 MG Take 1 tablet by mouth 2 (two) times daily. 04/28/22  Yes Bensimhon, Bevelyn Buckles, MD  spironolactone (ALDACTONE) 25 MG tablet Take 1 tablet (25 mg total) by mouth daily. 02/21/23  Yes Bensimhon, Bevelyn Buckles, MD  ticagrelor (BRILINTA) 90 MG TABS tablet Take 1 tablet (90 mg total) by mouth 2 (two) times daily. 03/22/23  Yes Bensimhon, Bevelyn Buckles, MD  nitroGLYCERIN (NITROSTAT) 0.4 MG SL tablet Place 1 tablet (0.4 mg total) under the tongue every 5 (five) minutes as needed for chest pain. NEEDS FOLLOW UP APPOINTMENT FOR MORE REFILLS 03/28/23 03/27/24  Andrey Farmer, PA-C    Family History History reviewed. No pertinent family history.  Social History Social History   Tobacco Use   Smoking status: Some Days    Packs/day: 0.50    Years: 15.00    Additional pack years: 0.00    Total pack years: 7.50    Types: Cigarettes   Smokeless tobacco: Never  Vaping Use   Vaping Use: Never used  Substance Use Topics   Alcohol use: Yes  Comment: occasionally   Drug use: No     Allergies   Bee venom   Review of Systems Review of Systems   Physical Exam Triage Vital Signs ED Triage Vitals  Enc Vitals Group     BP 04/15/23 2019 123/80     Pulse Rate 04/15/23 2019 98     Resp 04/15/23 2019 18     Temp 04/15/23 2019 98.4 F (36.9 C)     Temp Source 04/15/23 2019 Oral     SpO2 04/15/23 2019 94 %     Weight --      Height --      Head Circumference --      Peak Flow --      Pain Score 04/15/23 2017 0     Pain Loc --      Pain Edu? --      Excl. in GC? --    No data found.  Updated Vital Signs BP 123/80 (BP  Location: Right Arm)   Pulse 98   Temp 98.4 F (36.9 C) (Oral)   Resp 18   SpO2 94%   Visual Acuity Right Eye Distance:   Left Eye Distance:   Bilateral Distance:    Right Eye Near:   Left Eye Near:    Bilateral Near:     Physical Exam Vitals and nursing note reviewed.  Constitutional:      Appearance: Normal appearance.  HENT:     Head: Normocephalic and atraumatic.     Right Ear: External ear normal.     Left Ear: External ear normal.     Nose: Nose normal.     Mouth/Throat:     Mouth: Mucous membranes are moist.  Eyes:     Conjunctiva/sclera: Conjunctivae normal.  Cardiovascular:     Rate and Rhythm: Normal rate and regular rhythm.     Heart sounds: Normal heart sounds. No murmur heard. Pulmonary:     Effort: Pulmonary effort is normal. No respiratory distress.     Breath sounds: Normal breath sounds.  Skin:    General: Skin is warm and dry.  Neurological:     General: No focal deficit present.     Mental Status: He is alert and oriented to person, place, and time.  Psychiatric:        Mood and Affect: Mood normal.        Behavior: Behavior normal. Behavior is cooperative.      UC Treatments / Results  Labs (all labs ordered are listed, but only abnormal results are displayed) Labs Reviewed - No data to display  EKG   Radiology No results found.  Procedures Procedures (including critical care time)  Medications Ordered in UC Medications - No data to display  Initial Impression / Assessment and Plan / UC Course  I have reviewed the triage vital signs and the nursing notes.  Pertinent labs & imaging results that were available during my care of the patient were reviewed by me and considered in my medical decision making (see chart for details).  Vitals in triage reviewed, patient is hemodynamically stable.  Presents to clinic requesting a work note.  Had some palpitations at work yesterday, reports his job is stressful.  Palpitations have since  resolved.  He is a daily smoker.  EKG shows normal sinus rhythm w/o STE or STD, comparable to previous.  Patient without active chest pain, shortness of breath, diaphoresis or current complaints.  Stressed importance of following up with PCP and cardiology.  Strict emergency precautions given.  Patient verbalized understanding, no questions at this time.  Work note provided.     Final Clinical Impressions(s) / UC Diagnoses   Final diagnoses:  Encounter for medical screening examination     Discharge Instructions      I have provided you with a work note.  Is important that you follow-up with your primary care and with your cardiologist for further evaluation.  If you develop any chest pain, shortness of breath or irregular heartbeat, please call 911 or go to the nearest emergency department immediately.    ED Prescriptions   None    PDMP not reviewed this encounter.   Nyxon Strupp, [place], [place] 04/15/23 2052    Nevah Dalal, [place], [place] 04/15/23 2053

## 2023-04-20 ENCOUNTER — Other Ambulatory Visit (HOSPITAL_COMMUNITY): Payer: Self-pay

## 2023-04-20 ENCOUNTER — Encounter (HOSPITAL_COMMUNITY): Payer: Self-pay | Admitting: *Deleted

## 2023-04-20 ENCOUNTER — Ambulatory Visit (HOSPITAL_COMMUNITY)
Admission: EM | Admit: 2023-04-20 | Discharge: 2023-04-20 | Disposition: A | Discharge status: Home / Self Care | Payer: Medicaid Other | Attending: Emergency Medicine | Admitting: Emergency Medicine

## 2023-04-20 DIAGNOSIS — Z566 Other physical and mental strain related to work: Secondary | ICD-10-CM

## 2023-04-20 DIAGNOSIS — Z0289 Encounter for other administrative examinations: Secondary | ICD-10-CM | POA: Diagnosis not present

## 2023-04-20 NOTE — ED Triage Notes (Signed)
Pt denies any sx states his work is stressful and he hasn't been going just needs a note for work.

## 2023-04-20 NOTE — ED Provider Notes (Signed)
[facility]    CSN: [id number] Arrival date & time: 04/20/23  1933      History   Chief Complaint Chief Complaint  Patient presents with   Letter for School/Work    HPI Willie Campbell is a 52 y.o. male.  Here for work note He has a lot of stress at work and feels his employer does not provide accommodations for his needs. He would like to return to work tomorrow, but has a shift tonight. Requesting note.   Past Medical History:  Diagnosis Date   CAD (coronary artery disease)    Cardiac arrest (HCC) 02/26/2022   CHF (congestive heart failure) (HCC)    STEMI (ST elevation myocardial infarction) (HCC) 02/26/2022    Patient Active Problem List   Diagnosis Date Noted   Cardiac arrest (HCC) 02/26/2022   STEMI (ST elevation myocardial infarction) (HCC) 02/26/2022   Acute systolic heart failure (HCC)    Cardiogenic shock (HCC)     Past Surgical History:  Procedure Laterality Date   CORONARY STENT INTERVENTION N/A 02/26/2022   Procedure: CORONARY STENT INTERVENTION;  Surgeon: Lyn Records, MD;  [facility];  Service: Cardiovascular;  Laterality: N/A;   HERNIA REPAIR     RIGHT/LEFT HEART CATH AND CORONARY ANGIOGRAPHY N/A 02/26/2022   Procedure: RIGHT/LEFT HEART CATH AND CORONARY ANGIOGRAPHY;  Surgeon: Lyn Records, MD;  [facility];  Service: Cardiovascular;  Laterality: N/A;       Home Medications    Prior to Admission medications   Medication Sig Start Date End Date Taking? Authorizing Provider  aspirin (ASPIRIN LOW DOSE) 81 MG chewable tablet Chew 1 tablet (81 mg total) by mouth daily. NEEDS FOLLOW UP APPOINTMENT FOR ANYMORE REFILLS 03/28/23  Yes Andrey Farmer, PA-C  atorvastatin (LIPITOR) 40 MG tablet Take 1 tablet (40 mg total) by mouth daily. 11/11/22  Yes Andrey Farmer, PA-C  carvedilol (COREG) 3.125 MG tablet Take 1 tablet (3.125 mg total) by mouth 2 (two) times daily with a meal. 11/11/22  Yes Andrey Farmer, PA-C  empagliflozin (JARDIANCE) 10 MG TABS tablet Take 1 tablet (10 mg total) by mouth daily. NEEDS FOLLOW UP APPOINTMENT FOR MORE REFILLS 03/28/23  Yes Andrey Farmer, PA-C  nitroGLYCERIN (NITROSTAT) 0.4 MG SL tablet Place 1 tablet (0.4 mg total) under the tongue every 5 (five) minutes as needed for chest pain. NEEDS FOLLOW UP APPOINTMENT FOR MORE REFILLS 03/28/23 03/27/24 Yes Andrey Farmer, PA-C  sacubitril-valsartan (ENTRESTO) 24-26 MG Take 1 tablet by mouth 2 (two) times daily. 04/28/22  Yes Bensimhon, Bevelyn Buckles, MD  spironolactone (ALDACTONE) 25 MG tablet Take 1 tablet (25 mg total) by mouth daily. 02/21/23  Yes Bensimhon, Bevelyn Buckles, MD  ticagrelor (BRILINTA) 90 MG TABS tablet Take 1 tablet (90 mg total) by mouth 2 (two) times daily. 03/22/23  Yes Bensimhon, Bevelyn Buckles, MD    Family History History reviewed. No pertinent family history.  Social History Social History   Tobacco Use   Smoking status: Some Days    Packs/day: 0.50    Years: 15.00    Additional pack years: 0.00    Total pack years: 7.50    Types: Cigarettes   Smokeless tobacco: Never  Vaping Use   Vaping Use: Never used  Substance Use Topics   Alcohol use: Yes    Comment: occasionally   Drug use: No     Allergies   Bee venom   Review of Systems Review  of Systems As per HPI  Physical Exam Triage Vital Signs ED Triage Vitals  Enc Vitals Group     BP 04/20/23 2006 120/80     Pulse Rate 04/20/23 2006 85     Resp 04/20/23 2006 18     Temp 04/20/23 2006 98.3 F (36.8 C)     Temp Source 04/20/23 2006 Oral     SpO2 04/20/23 2006 96 %     Weight --      Height --      Head Circumference --      Peak Flow --      Pain Score 04/20/23 2002 0     Pain Loc --      Pain Edu? --      Excl. in GC? --    No data found.  Updated Vital Signs BP 120/80 (BP Location: Left Arm)   Pulse 85   Temp 98.3 F (36.8 C) (Oral)   Resp 18   SpO2 96%    Physical Exam Vitals and nursing note  reviewed.  Constitutional:      General: He is not in acute distress.    Appearance: Normal appearance.  HENT:     Mouth/Throat:     Pharynx: Oropharynx is clear.  Cardiovascular:     Rate and Rhythm: Normal rate and regular rhythm.     Pulses: Normal pulses.     Heart sounds: Normal heart sounds.  Pulmonary:     Effort: Pulmonary effort is normal.     Breath sounds: Normal breath sounds.  Neurological:     Mental Status: He is alert and oriented to person, place, and time.      UC Treatments / Results  Labs (all labs ordered are listed, but only abnormal results are displayed) Labs Reviewed - No data to display  EKG   Radiology No results found.  Procedures Procedures (including critical care time)  Medications Ordered in UC Medications - No data to display  Initial Impression / Assessment and Plan / UC Course  I have reviewed the triage vital signs and the nursing notes.  Pertinent labs & imaging results that were available during my care of the patient were reviewed by me and considered in my medical decision making (see chart for details).  Work note provided He has stress at work that worries him due to his history of cardiac arrest. I have provided him with employee health and wellness information for work restrictions or FMLA if needed. No symptoms or concerns today. No questions  Final Clinical Impressions(s) / UC Diagnoses   Final diagnoses:  Stress at work  Encounter to obtain excuse from work     Discharge Instructions      Please call the clinic below to make an appointment. They can provide you with short term disability and FMLA/work restrictions if needed.  Go to the emergency department if you have any chest pain or shortness of breath    ED Prescriptions   None    PDMP not reviewed this encounter.   Earlyn Sylvan, [place] 04/20/23 2047

## 2023-04-20 NOTE — Discharge Instructions (Addendum)
Please call the clinic below to make an appointment. They can provide you with short term disability and FMLA/work restrictions if needed.  Go to the emergency department if you have any chest pain or shortness of breath

## 2023-04-27 ENCOUNTER — Ambulatory Visit: Payer: Medicaid Other | Admitting: Nurse Practitioner

## 2023-05-26 ENCOUNTER — Ambulatory Visit (HOSPITAL_COMMUNITY)
Admission: EM | Admit: 2023-05-26 | Discharge: 2023-05-26 | Disposition: A | Discharge status: Home / Self Care | Payer: Medicaid Other | Attending: Internal Medicine | Admitting: Internal Medicine

## 2023-05-26 ENCOUNTER — Encounter (HOSPITAL_COMMUNITY): Payer: Self-pay | Admitting: Internal Medicine

## 2023-05-26 DIAGNOSIS — R0789 Other chest pain: Secondary | ICD-10-CM

## 2023-05-26 DIAGNOSIS — Z760 Encounter for issue of repeat prescription: Secondary | ICD-10-CM | POA: Diagnosis not present

## 2023-05-26 MED ORDER — ENTRESTO 24-26 MG PO TABS
1.0000 | ORAL_TABLET | Freq: Two times a day (BID) | ORAL | 0 refills | Status: DC
  Filled 2023-05-26: qty 30, 15d supply, fill #0

## 2023-05-26 NOTE — ED Triage Notes (Addendum)
Patient states he needs a work note. Patient states he had chest pain yesterday and is exhausted so he took today off of work and needs a work note. Patient also stated, "I maybe stressed out too."  Patient states he is needing a refill of all his medication.

## 2023-05-26 NOTE — ED Provider Notes (Signed)
[facility]    CSN: [id number] Arrival date & time: 05/26/23  1715      History   Chief Complaint Chief Complaint  Patient presents with   work note   Medication Refill    HPI Willie Campbell is a 52 y.o. male who presents requesting a work note for missing work yesterday due to having chest pains and decided to stay home and rest. He describes his pain as tightness which was different than the pain when he had a hard attack. He had not taken his meds, so decided to go ahead and take them except the Nitroglycerine. He admits he is around people that stress him out, and feels that was the reason of  his chest pain. After 15 minutes the pain resolved and decided to not meet with the friends he was going to meet. He has been fine since. Has hx of having MI last year. Today he denies having CP, SOB, URI or GI symptoms.    2- He also would like a refill of his Entresto. He has an appointment with his cardiologist next month.    Past Medical History:  Diagnosis Date   CAD (coronary artery disease)    Cardiac arrest (HCC) 02/26/2022   CHF (congestive heart failure) (HCC)    STEMI (ST elevation myocardial infarction) (HCC) 02/26/2022    Patient Active Problem List   Diagnosis Date Noted   Cardiac arrest (HCC) 02/26/2022   STEMI (ST elevation myocardial infarction) (HCC) 02/26/2022   Acute systolic heart failure (HCC)    Cardiogenic shock (HCC)     Past Surgical History:  Procedure Laterality Date   CORONARY STENT INTERVENTION N/A 02/26/2022   Procedure: CORONARY STENT INTERVENTION;  Surgeon: Lyn Records, MD;  [facility];  Service: Cardiovascular;  Laterality: N/A;   HERNIA REPAIR     RIGHT/LEFT HEART CATH AND CORONARY ANGIOGRAPHY N/A 02/26/2022   Procedure: RIGHT/LEFT HEART CATH AND CORONARY ANGIOGRAPHY;  Surgeon: Lyn Records, MD;  [facility];  Service: Cardiovascular;  Laterality: N/A;       Home Medications    Prior to  Admission medications   Medication Sig Start Date End Date Taking? Authorizing Provider  aspirin (ASPIRIN LOW DOSE) 81 MG chewable tablet Chew 1 tablet (81 mg total) by mouth daily. NEEDS FOLLOW UP APPOINTMENT FOR ANYMORE REFILLS 03/28/23   Andrey Farmer, PA-C  atorvastatin (LIPITOR) 40 MG tablet Take 1 tablet (40 mg total) by mouth daily. 11/11/22   Andrey Farmer, PA-C  carvedilol (COREG) 3.125 MG tablet Take 1 tablet (3.125 mg total) by mouth 2 (two) times daily with a meal. 11/11/22   Andrey Farmer, PA-C  empagliflozin (JARDIANCE) 10 MG TABS tablet Take 1 tablet (10 mg total) by mouth daily. NEEDS FOLLOW UP APPOINTMENT FOR MORE REFILLS 03/28/23   Andrey Farmer, PA-C  nitroGLYCERIN (NITROSTAT) 0.4 MG SL tablet Place 1 tablet (0.4 mg total) under the tongue every 5 (five) minutes as needed for chest pain. NEEDS FOLLOW UP APPOINTMENT FOR MORE REFILLS 03/28/23 03/27/24  Andrey Farmer, PA-C  sacubitril-valsartan (ENTRESTO) 24-26 MG Take 1 tablet by mouth 2 (two) times daily. 05/26/23   Rodriguez-Southworth, Nettie Elm, PA-C  spironolactone (ALDACTONE) 25 MG tablet Take 1 tablet (25 mg total) by mouth daily. 02/21/23   Bensimhon, Bevelyn Buckles, MD  ticagrelor (BRILINTA) 90 MG TABS tablet Take 1 tablet (90 mg total) by mouth 2 (two) times daily. 03/22/23   Bensimhon, Reuel Boom  R, MD    Family History History reviewed. No pertinent family history.  Social History Social History   Tobacco Use   Smoking status: Some Days    Current packs/day: 0.50    Average packs/day: 0.5 packs/day for 15.0 years (7.5 ttl pk-yrs)    Types: Cigarettes   Smokeless tobacco: Never  Vaping Use   Vaping status: Never Used  Substance Use Topics   Alcohol use: Yes    Comment: occasionally   Drug use: No     Allergies   Bee venom   Review of Systems Review of Systems As noted in HPI  Physical Exam Triage Vital Signs ED Triage Vitals [05/26/23 1755]  Encounter Vitals Group     BP  107/69     Systolic BP Percentile      Diastolic BP Percentile      Pulse Rate 84     Resp 14     Temp 98.1 F (36.7 C)     Temp Source Oral     SpO2 95 %     Weight      Height      Head Circumference      Peak Flow      Pain Score 0     Pain Loc      Pain Education      Exclude from Growth Chart    No data found.  Updated Vital Signs BP 107/69 (BP Location: Left Arm)   Pulse 84   Temp 98.1 F (36.7 C) (Oral)   Resp 14   SpO2 95%   Visual Acuity Right Eye Distance:   Left Eye Distance:   Bilateral Distance:    Right Eye Near:   Left Eye Near:    Bilateral Near:     Physical Exam Vitals and nursing note reviewed.  Constitutional:      General: He is not in acute distress.    Appearance: He is not toxic-appearing.  HENT:     Head: Normocephalic.     Right Ear: External ear normal.     Left Ear: External ear normal.     Nose: Nose normal.  Eyes:     General: No scleral icterus.    Conjunctiva/sclera: Conjunctivae normal.  Cardiovascular:     Rate and Rhythm: Normal rate and regular rhythm.     Heart sounds: No murmur heard. Pulmonary:     Effort: Pulmonary effort is normal.     Breath sounds: Normal breath sounds.  Musculoskeletal:        General: Normal range of motion.     Cervical back: Neck supple.     Right lower leg: No edema.     Left lower leg: No edema.  Skin:    General: Skin is warm and dry.  Neurological:     Mental Status: He is alert and oriented to person, place, and time.     Gait: Gait normal.  Psychiatric:        Mood and Affect: Mood normal.        Behavior: Behavior normal.        Thought Content: Thought content normal.        Judgment: Judgment normal.      UC Treatments / Results  Labs (all labs ordered are listed, but only abnormal results are displayed) Labs Reviewed - No data to display  EKG   Radiology No results found.  Procedures Procedures (including critical care time)  Medications Ordered in  UC Medications -  No data to display  Initial Impression / Assessment and Plan / UC Course  I have reviewed the triage vital signs and the nursing notes.  Chest pain resolved. Medication Refill  I explained to pt I could not give him a note for yesterday since we did not see him. Pt vented for about 15 minutes about all his stressors and realizing he needs to leave those stressful relationships.  I refilled his Sherryll Burger and will keep appt with his cardiologist next month.  Final Clinical Impressions(s) / UC Diagnoses   Final diagnoses:  Other chest pain  Medication refill     Discharge Instructions      Please make sure you follow up with your cardiologist in the next month. This refill is a one time thing as a curtesy until you see your doctor.      ED Prescriptions     Medication Sig Dispense Auth. Provider   sacubitril-valsartan (ENTRESTO) 24-26 MG Take 1 tablet by mouth 2 (two) times daily. 30 tablet Rodriguez-Southworth, Nettie Elm, PA-C      PDMP not reviewed this encounter.   Garey Ham, [place] 05/27/23 [phone]

## 2023-05-26 NOTE — Discharge Instructions (Addendum)
Please make sure you follow up with your cardiologist in the next month. This refill is a one time thing as a curtesy until you see your doctor.

## 2023-05-27 ENCOUNTER — Other Ambulatory Visit (HOSPITAL_COMMUNITY): Payer: Self-pay

## 2023-06-08 ENCOUNTER — Other Ambulatory Visit (HOSPITAL_COMMUNITY): Payer: Self-pay

## 2023-06-21 DIAGNOSIS — Z79899 Other long term (current) drug therapy: Secondary | ICD-10-CM | POA: Diagnosis not present

## 2023-06-21 DIAGNOSIS — R079 Chest pain, unspecified: Secondary | ICD-10-CM | POA: Diagnosis not present

## 2023-06-21 DIAGNOSIS — R0602 Shortness of breath: Secondary | ICD-10-CM | POA: Diagnosis not present

## 2023-06-21 DIAGNOSIS — Z8679 Personal history of other diseases of the circulatory system: Secondary | ICD-10-CM | POA: Diagnosis not present

## 2023-06-21 DIAGNOSIS — Z91148 Patient's other noncompliance with medication regimen for other reason: Secondary | ICD-10-CM | POA: Diagnosis not present

## 2023-06-22 ENCOUNTER — Other Ambulatory Visit (HOSPITAL_COMMUNITY): Payer: Self-pay

## 2023-06-22 MED ORDER — NITROGLYCERIN 0.4 MG SL SUBL
0.4000 mg | SUBLINGUAL_TABLET | SUBLINGUAL | 1 refills | Status: AC | PRN
  Filled 2023-06-22: qty 25, 30d supply, fill #0
  Filled 2023-09-01: qty 25, 30d supply, fill #1

## 2023-06-22 MED ORDER — CARVEDILOL 3.125 MG PO TABS
3.1250 mg | ORAL_TABLET | Freq: Two times a day (BID) | ORAL | 0 refills | Status: DC
  Filled 2023-06-22: qty 120, 60d supply, fill #0

## 2023-06-22 MED ORDER — ASPIRIN 81 MG PO CHEW
81.0000 mg | CHEWABLE_TABLET | Freq: Every day | ORAL | 0 refills | Status: AC
  Filled 2023-06-22: qty 60, 60d supply, fill #0

## 2023-06-22 MED ORDER — ATORVASTATIN CALCIUM 40 MG PO TABS
40.0000 mg | ORAL_TABLET | Freq: Every day | ORAL | 0 refills | Status: DC
  Filled 2023-06-22 (×2): qty 60, 60d supply, fill #0

## 2023-06-22 MED ORDER — BRILINTA 90 MG PO TABS
90.0000 mg | ORAL_TABLET | Freq: Two times a day (BID) | ORAL | 0 refills | Status: DC
  Filled 2023-06-22: qty 120, 60d supply, fill #0

## 2023-06-22 MED ORDER — SPIRONOLACTONE 25 MG PO TABS
25.0000 mg | ORAL_TABLET | Freq: Every day | ORAL | 0 refills | Status: DC
  Filled 2023-06-22: qty 60, 60d supply, fill #0

## 2023-06-22 MED ORDER — ENTRESTO 24-26 MG PO TABS
1.0000 | ORAL_TABLET | Freq: Two times a day (BID) | ORAL | 0 refills | Status: DC
  Filled 2023-06-22: qty 120, 60d supply, fill #0

## 2023-07-04 ENCOUNTER — Ambulatory Visit: Payer: Medicaid Other | Admitting: Nurse Practitioner

## 2023-07-08 DIAGNOSIS — R079 Chest pain, unspecified: Secondary | ICD-10-CM | POA: Diagnosis not present

## 2023-07-18 ENCOUNTER — Ambulatory Visit (HOSPITAL_COMMUNITY)
Admission: EM | Admit: 2023-07-18 | Discharge: 2023-07-18 | Disposition: A | Discharge status: Home / Self Care | Payer: Medicaid Other | Attending: Internal Medicine | Admitting: Internal Medicine

## 2023-07-18 ENCOUNTER — Encounter (HOSPITAL_COMMUNITY): Payer: Self-pay

## 2023-07-18 DIAGNOSIS — M62830 Muscle spasm of back: Secondary | ICD-10-CM | POA: Diagnosis not present

## 2023-07-18 MED ORDER — METHOCARBAMOL 500 MG PO TABS: 500.0000 mg | ORAL_TABLET | Freq: Three times a day (TID) | ORAL | 0 refills | Status: DC | PRN

## 2023-07-18 NOTE — ED Triage Notes (Addendum)
Patient here today with c/o LB pain X 3 days. He has a h/o back pain. Rest helps.

## 2023-07-18 NOTE — ED Provider Notes (Signed)
[facility]    CSN: [id number] Arrival date & time: 07/18/23  1228      History   Chief Complaint Chief Complaint  Patient presents with   Back Pain    HPI JALENE DIBLASI is a 52 y.o. male.   52 year old male who presents to urgent care with complaints of point tenderness around the right lumbar region.  He said this started Friday and he was unable to go to work.  It is continued through today.  He denies any radiculopathy.  He denies any bowel or bladder incontinence.  He has had no specific injury that he knows of but wonders if it might be the way he sits in his car.  He reports he needs a note for work today also. Denies fevers, chills, urinary symptoms, CVA tenderness, abdominal pain, chest pain.    Back Pain Associated symptoms: no abdominal pain, no chest pain, no dysuria and no fever     Past Medical History:  Diagnosis Date   CAD (coronary artery disease)    Cardiac arrest (HCC) 02/26/2022   CHF (congestive heart failure) (HCC)    STEMI (ST elevation myocardial infarction) (HCC) 02/26/2022    Patient Active Problem List   Diagnosis Date Noted   Cardiac arrest (HCC) 02/26/2022   STEMI (ST elevation myocardial infarction) (HCC) 02/26/2022   Acute systolic heart failure (HCC)    Cardiogenic shock (HCC)     Past Surgical History:  Procedure Laterality Date   CORONARY STENT INTERVENTION N/A 02/26/2022   Procedure: CORONARY STENT INTERVENTION;  Surgeon: Lyn Records, MD;  Location: [facility];  Service: Cardiovascular;  Laterality: N/A;   HERNIA REPAIR     RIGHT/LEFT HEART CATH AND CORONARY ANGIOGRAPHY N/A 02/26/2022   Procedure: RIGHT/LEFT HEART CATH AND CORONARY ANGIOGRAPHY;  Surgeon: Lyn Records, MD;  Location: [facility];  Service: Cardiovascular;  Laterality: N/A;       Home Medications    Prior to Admission medications   Medication Sig Start Date End Date Taking? Authorizing Provider  aspirin (ASPIRIN LOW DOSE) 81 MG  chewable tablet Chew 1 tablet (81 mg total) by mouth daily. NEEDS FOLLOW UP APPOINTMENT FOR ANYMORE REFILLS 03/28/23   Andrey Farmer, PA-C  aspirin 81 MG chewable tablet Chew 1 tablet (81 mg total) by mouth and swallow daily. 06/21/23     atorvastatin (LIPITOR) 40 MG tablet Take 1 tablet (40 mg total) by mouth daily. 11/11/22   Andrey Farmer, PA-C  atorvastatin (LIPITOR) 40 MG tablet Take 1 tablet (40 mg total) by mouth daily. 06/21/23     carvedilol (COREG) 3.125 MG tablet Take 1 tablet (3.125 mg total) by mouth 2 (two) times daily with a meal. 11/11/22   Andrey Farmer, PA-C  carvedilol (COREG) 3.125 MG tablet Take 1 tablet (3.125 mg total) by mouth in the morning and 1 tablet (3.125 mg total) in the evening. Take with meals. 06/21/23     empagliflozin (JARDIANCE) 10 MG TABS tablet Take 1 tablet (10 mg total) by mouth daily. NEEDS FOLLOW UP APPOINTMENT FOR MORE REFILLS 03/28/23   Andrey Farmer, PA-C  ENTRESTO 24-26 MG Take 1 tablet by mouth 2 (two) times daily. 06/21/23     nitroGLYCERIN (NITROSTAT) 0.4 MG SL tablet Place 1 tablet (0.4 mg total) under the tongue every 5 (five) minutes as needed for chest pain. NEEDS FOLLOW UP APPOINTMENT FOR MORE REFILLS 03/28/23 03/27/24  Andrey Farmer, PA-C  nitroGLYCERIN (NITROSTAT) 0.4  MG SL tablet Place 1 tablet (0.4 mg total) under the tongue every 5 (five) minutes as needed for chest pain. 06/21/23     sacubitril-valsartan (ENTRESTO) 24-26 MG Take 1 tablet by mouth 2 (two) times daily. 05/26/23   Rodriguez-Southworth, Nettie Elm, PA-C  spironolactone (ALDACTONE) 25 MG tablet Take 1 tablet (25 mg total) by mouth daily. 02/21/23   Bensimhon, Bevelyn Buckles, MD  spironolactone (ALDACTONE) 25 MG tablet Take 1 tablet (25 mg total) by mouth daily. 06/21/23     ticagrelor (BRILINTA) 90 MG TABS tablet Take 1 tablet (90 mg total) by mouth 2 (two) times daily. 03/22/23   Bensimhon, Bevelyn Buckles, MD  ticagrelor (BRILINTA) 90 MG TABS tablet Take 1 tablet (90 mg total)  by mouth 2 (two) times daily. 06/21/23       Family History History reviewed. No pertinent family history.  Social History Social History   Tobacco Use   Smoking status: Some Days    Current packs/day: 0.50    Average packs/day: 0.5 packs/day for 15.0 years (7.5 ttl pk-yrs)    Types: Cigarettes   Smokeless tobacco: Never  Vaping Use   Vaping status: Never Used  Substance Use Topics   Alcohol use: Yes    Comment: occasionally   Drug use: No     Allergies   Bee venom   Review of Systems Review of Systems  Constitutional:  Negative for chills and fever.  HENT:  Negative for ear pain and sore throat.   Eyes:  Negative for pain and visual disturbance.  Respiratory:  Negative for cough and shortness of breath.   Cardiovascular:  Negative for chest pain and palpitations.  Gastrointestinal:  Negative for abdominal pain and vomiting.  Genitourinary:  Negative for dysuria and hematuria.  Musculoskeletal:  Positive for back pain. Negative for arthralgias.  Skin:  Negative for color change and rash.  Neurological:  Negative for seizures and syncope.  All other systems reviewed and are negative.    Physical Exam Triage Vital Signs ED Triage Vitals  Encounter Vitals Group     BP 07/18/23 1246 (!) 147/78     Systolic BP Percentile --      Diastolic BP Percentile --      Pulse Rate 07/18/23 1246 83     Resp 07/18/23 1246 16     Temp 07/18/23 1246 98.2 F (36.8 C)     Temp Source 07/18/23 1246 Oral     SpO2 07/18/23 1246 94 %     Weight --      Height 07/18/23 1247 5\' 10"  (1.778 m)     Head Circumference --      Peak Flow --      Pain Score 07/18/23 1248 7     Pain Loc --      Pain Education --      Exclude from Growth Chart --    No data found.  Updated Vital Signs BP (!) 147/78 (BP Location: Left Arm)   Pulse 83   Temp 98.2 F (36.8 C) (Oral)   Resp 16   Ht 5\' 10"  (1.778 m)   SpO2 94%   BMI 30.22 kg/m   Visual Acuity Right Eye Distance:   Left Eye  Distance:   Bilateral Distance:    Right Eye Near:   Left Eye Near:    Bilateral Near:     Physical Exam Vitals and nursing note reviewed.  Constitutional:      General: He is not in acute distress.  Appearance: He is well-developed.  HENT:     Head: Normocephalic and atraumatic.  Eyes:     Conjunctiva/sclera: Conjunctivae normal.  Cardiovascular:     Rate and Rhythm: Normal rate and regular rhythm.     Heart sounds: No murmur heard. Pulmonary:     Effort: Pulmonary effort is normal. No respiratory distress.     Breath sounds: Normal breath sounds.  Abdominal:     Palpations: Abdomen is soft.     Tenderness: There is no abdominal tenderness.  Musculoskeletal:        General: No swelling.     Cervical back: Neck supple.       Back:  Skin:    General: Skin is warm and dry.     Capillary Refill: Capillary refill takes less than 2 seconds.  Neurological:     General: No focal deficit present.     Mental Status: He is alert and oriented to person, place, and time.  Psychiatric:        Mood and Affect: Mood normal.      UC Treatments / Results  Labs (all labs ordered are listed, but only abnormal results are displayed) Labs Reviewed - No data to display  EKG   Radiology No results found.  Procedures Procedures (including critical care time)  Medications Ordered in UC Medications - No data to display  Initial Impression / Assessment and Plan / UC Course  I have reviewed the triage vital signs and the nursing notes.  Pertinent labs & imaging results that were available during my care of the patient were reviewed by me and considered in my medical decision making (see chart for details).     Right lumbar back spasm without radiculopathy: May use robaxin every 8 hours as needed for muscle spasms. Use moist heat on the area and may do light stretching to help ease the pain. If symptoms worsen or fail to improve he should either return to urgent care or  consider follow-up with a sports medicine physician. Final Clinical Impressions(s) / UC Diagnoses   Final diagnoses:  None   Discharge Instructions   None    ED Prescriptions   None    PDMP not reviewed this encounter.   Landis Martins, [place] 07/18/23 1308

## 2023-07-18 NOTE — Discharge Instructions (Addendum)
May use robaxin every 8 hours as needed for muscle spasms. Use moist heat on the area and may do light stretching to help ease the pain.

## 2023-07-27 ENCOUNTER — Ambulatory Visit: Payer: Medicaid Other | Admitting: Nurse Practitioner

## 2023-08-02 DIAGNOSIS — M25561 Pain in right knee: Secondary | ICD-10-CM | POA: Diagnosis not present

## 2023-08-04 ENCOUNTER — Telehealth: Payer: Self-pay

## 2023-08-04 NOTE — Telephone Encounter (Signed)
[employer] Care   Unsuccessful Outreach Note  08/04/2023 Name: Willie Campbell MRN: [id number] DOB: 06/28/1971  Referred by: Claiborne Rigg, NP Reason for referral : No chief complaint on file.   An unsuccessful telephone outreach was attempted today. The patient was referred to the case management team for assistance with care management and care coordination.   Follow Up Plan: If patient returns call to provider office, please advise to call Embedded Care Management Care Guide Nicholes Rough* at [phone]*  Nicholes Rough, CMA Care Guide [employer]

## 2023-08-05 DIAGNOSIS — M25561 Pain in right knee: Secondary | ICD-10-CM | POA: Diagnosis not present

## 2023-08-17 ENCOUNTER — Encounter (HOSPITAL_COMMUNITY): Payer: Self-pay

## 2023-08-17 ENCOUNTER — Ambulatory Visit (HOSPITAL_COMMUNITY)
Admission: EM | Admit: 2023-08-17 | Discharge: 2023-08-17 | Disposition: A | Discharge status: Home / Self Care | Payer: Medicaid Other | Attending: Emergency Medicine | Admitting: Emergency Medicine

## 2023-08-17 DIAGNOSIS — M62830 Muscle spasm of back: Secondary | ICD-10-CM | POA: Diagnosis not present

## 2023-08-17 MED ORDER — TRIAMCINOLONE ACETONIDE 40 MG/ML IJ SUSP
INTRAMUSCULAR | Status: AC
  Filled 2023-08-17: qty 1

## 2023-08-17 MED ORDER — CYCLOBENZAPRINE HCL 10 MG PO TABS: 10.0000 mg | ORAL_TABLET | Freq: Two times a day (BID) | ORAL | 0 refills | Status: DC | PRN

## 2023-08-17 MED ORDER — TRIAMCINOLONE ACETONIDE 40 MG/ML IJ SUSP
40.0000 mg | Freq: Once | INTRAMUSCULAR | Status: AC
  Administered 2023-08-17: 40 mg via INTRAMUSCULAR

## 2023-08-17 NOTE — ED Provider Notes (Signed)
[facility]    CSN: [id number] Arrival date & time: 08/17/23  1505      History   Chief Complaint Chief Complaint  Patient presents with   Back Pain    HPI Willie Campbell is a 52 y.o. male.   Patient presents to clinic for right sided low back pain that happened suddenly at work when bending to pick up a small box. Denies trauma or falls. Denies incontinence. No inner leg numbness.   He is ambulatory. Pain with bending and twisting. Pain with switching positions from sitting or lying to standing.   Tried a muscle relaxer without much pain relief.     The history is provided by the patient and medical records.  Back Pain Associated symptoms: no dysuria, no numbness and no weakness     Past Medical History:  Diagnosis Date   CAD (coronary artery disease)    Cardiac arrest (HCC) 02/26/2022   CHF (congestive heart failure) (HCC)    STEMI (ST elevation myocardial infarction) (HCC) 02/26/2022    Patient Active Problem List   Diagnosis Date Noted   Cardiac arrest (HCC) 02/26/2022   STEMI (ST elevation myocardial infarction) (HCC) 02/26/2022   Acute systolic heart failure (HCC)    Cardiogenic shock (HCC)     Past Surgical History:  Procedure Laterality Date   CORONARY STENT INTERVENTION N/A 02/26/2022   Procedure: CORONARY STENT INTERVENTION;  Surgeon: Lyn Records, MD;  [facility];  Service: Cardiovascular;  Laterality: N/A;   HERNIA REPAIR     RIGHT/LEFT HEART CATH AND CORONARY ANGIOGRAPHY N/A 02/26/2022   Procedure: RIGHT/LEFT HEART CATH AND CORONARY ANGIOGRAPHY;  Surgeon: Lyn Records, MD;  [facility];  Service: Cardiovascular;  Laterality: N/A;       Home Medications    Prior to Admission medications   Medication Sig Start Date End Date Taking? Authorizing Provider  aspirin (ASPIRIN LOW DOSE) 81 MG chewable tablet Chew 1 tablet (81 mg total) by mouth daily. NEEDS FOLLOW UP APPOINTMENT FOR ANYMORE REFILLS  03/28/23  Yes Andrey Farmer, PA-C  atorvastatin (LIPITOR) 40 MG tablet Take 1 tablet (40 mg total) by mouth daily. 11/11/22  Yes Andrey Farmer, PA-C  atorvastatin (LIPITOR) 40 MG tablet Take 1 tablet (40 mg total) by mouth daily. 06/21/23  Yes   carvedilol (COREG) 3.125 MG tablet Take 1 tablet (3.125 mg total) by mouth 2 (two) times daily with a meal. 11/11/22  Yes Andrey Farmer, PA-C  carvedilol (COREG) 3.125 MG tablet Take 1 tablet (3.125 mg total) by mouth in the morning and 1 tablet (3.125 mg total) in the evening. Take with meals. 06/21/23  Yes   cyclobenzaprine (FLEXERIL) 10 MG tablet Take 1 tablet (10 mg total) by mouth 2 (two) times daily as needed for muscle spasms. 08/17/23  Yes Rinaldo Ratel, [place], FNP  empagliflozin (JARDIANCE) 10 MG TABS tablet Take 1 tablet (10 mg total) by mouth daily. NEEDS FOLLOW UP APPOINTMENT FOR MORE REFILLS 03/28/23  Yes Andrey Farmer, PA-C  ENTRESTO 24-26 MG Take 1 tablet by mouth 2 (two) times daily. 06/21/23  Yes   methocarbamol (ROBAXIN) 500 MG tablet Take 1 tablet (500 mg total) by mouth every 8 (eight) hours as needed for muscle spasms. 07/18/23  Yes White, Elizabeth A, PA-C  sacubitril-valsartan (ENTRESTO) 24-26 MG Take 1 tablet by mouth 2 (two) times daily. 05/26/23  Yes Rodriguez-Southworth, Nettie Elm, PA-C  spironolactone (ALDACTONE) 25 MG tablet Take 1 tablet (  25 mg total) by mouth daily. 02/21/23  Yes Bensimhon, Bevelyn Buckles, MD  spironolactone (ALDACTONE) 25 MG tablet Take 1 tablet (25 mg total) by mouth daily. 06/21/23  Yes   ticagrelor (BRILINTA) 90 MG TABS tablet Take 1 tablet (90 mg total) by mouth 2 (two) times daily. 06/21/23  Yes   aspirin 81 MG chewable tablet Chew 1 tablet (81 mg total) by mouth and swallow daily. 06/21/23     nitroGLYCERIN (NITROSTAT) 0.4 MG SL tablet Place 1 tablet (0.4 mg total) under the tongue every 5 (five) minutes as needed for chest pain. NEEDS FOLLOW UP APPOINTMENT FOR MORE REFILLS 03/28/23 03/27/24  Andrey Farmer, PA-C  nitroGLYCERIN (NITROSTAT) 0.4 MG SL tablet Place 1 tablet (0.4 mg total) under the tongue every 5 (five) minutes as needed for chest pain. 06/21/23     ticagrelor (BRILINTA) 90 MG TABS tablet Take 1 tablet (90 mg total) by mouth 2 (two) times daily. 03/22/23   Bensimhon, Bevelyn Buckles, MD    Family History History reviewed. No pertinent family history.  Social History Social History   Tobacco Use   Smoking status: Some Days    Current packs/day: 0.50    Average packs/day: 0.5 packs/day for 15.0 years (7.5 ttl pk-yrs)    Types: Cigarettes   Smokeless tobacco: Never  Vaping Use   Vaping status: Never Used  Substance Use Topics   Alcohol use: Yes    Comment: occasionally   Drug use: No     Allergies   Bee venom   Review of Systems Review of Systems  Genitourinary:  Negative for dysuria, flank pain and frequency.  Musculoskeletal:  Positive for back pain. Negative for gait problem.  Neurological:  Negative for weakness and numbness.     Physical Exam Triage Vital Signs ED Triage Vitals  Encounter Vitals Group     BP 08/17/23 1608 133/88     Systolic BP Percentile --      Diastolic BP Percentile --      Pulse Rate 08/17/23 1608 81     Resp 08/17/23 1608 16     Temp 08/17/23 1608 98.3 F (36.8 C)     Temp Source 08/17/23 1608 Oral     SpO2 08/17/23 1608 97 %     Weight --      Height --      Head Circumference --      Peak Flow --      Pain Score 08/17/23 1609 10     Pain Loc --      Pain Education --      Exclude from Growth Chart --    No data found.  Updated Vital Signs BP 133/88 (BP Location: Left Arm)   Pulse 81   Temp 98.3 F (36.8 C) (Oral)   Resp 16   SpO2 97%   Visual Acuity Right Eye Distance:   Left Eye Distance:   Bilateral Distance:    Right Eye Near:   Left Eye Near:    Bilateral Near:     Physical Exam Vitals and nursing note reviewed.  Constitutional:      Appearance: Normal appearance.  HENT:     Head: Normocephalic  and atraumatic.     Right Ear: External ear normal.     Left Ear: External ear normal.     Nose: Nose normal.     Mouth/Throat:     Mouth: Mucous membranes are moist.  Eyes:     Conjunctiva/sclera: Conjunctivae normal.  Cardiovascular:     Rate and Rhythm: Normal rate.  Pulmonary:     Effort: Pulmonary effort is normal. No respiratory distress.  Musculoskeletal:        General: Tenderness present. No swelling, deformity or signs of injury. Normal range of motion.     Cervical back: Normal and normal range of motion.     Thoracic back: Normal.     Lumbar back: Spasms and tenderness present.       Back:     Comments: Point tenderness to right lumbar area with minor swelling. Spine without tenderness.   Skin:    General: Skin is warm and dry.  Neurological:     General: No focal deficit present.     Mental Status: He is alert and oriented to person, place, and time.  Psychiatric:        Mood and Affect: Mood normal.        Behavior: Behavior normal. Behavior is cooperative.      UC Treatments / Results  Labs (all labs ordered are listed, but only abnormal results are displayed) Labs Reviewed - No data to display  EKG   Radiology No results found.  Procedures Procedures (including critical care time)  Medications Ordered in UC Medications  triamcinolone acetonide (KENALOG-40) injection 40 mg (has no administration in time range)    Initial Impression / Assessment and Plan / UC Course  I have reviewed the triage vital signs and the nursing notes.  Pertinent labs & imaging results that were available during my care of the patient were reviewed by me and considered in my medical decision making (see chart for details).  Vitals and triage reviewed, patient is hemodynamically stable.  Appears to have musculoskeletal back pain, pain after bending.  Right lumbar area with tenderness to palpation and minor swelling.  Atraumatic.  Without red flag symptoms of inner leg  numbness, radiation of pain or incontinence.  Low concern for cauda equina.  Suspect musculoskeletal pain, will trial IM steroid injection and muscle relaxers.  Plan of care, follow-up care and return precautions given, no questions at this time.     Final Clinical Impressions(s) / UC Diagnoses   Final diagnoses:  Spasm of muscle of lower back     Discharge Instructions      We have given you a steroid injection in clinic.  You can take the Flexeril muscle relaxer up to twice daily, do not drink or drive on this medication as it may cause drowsiness.  Do not use this in combination with the Robaxin, as these are the same medication class.  I suggest gentle stretching, warm compresses and orthopedic follow-up if your symptoms persist despite these interventions.  Ensure you are using proper lifting technique when at work.  Return to clinic for any new or urgent symptoms.      ED Prescriptions     Medication Sig Dispense Auth. Provider   cyclobenzaprine (FLEXERIL) 10 MG tablet Take 1 tablet (10 mg total) by mouth 2 (two) times daily as needed for muscle spasms. 20 tablet [place], [place], [place]      PDMP not reviewed this encounter.   Arrion Burruel, [place], [place] 08/17/23 [phone]

## 2023-08-17 NOTE — ED Triage Notes (Signed)
Patient presents to the office for lower back pain. 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

## 2023-08-17 NOTE — Discharge Instructions (Addendum)
We have given you a steroid injection in clinic.  You can take the Flexeril muscle relaxer up to twice daily, do not drink or drive on this medication as it may cause drowsiness.  Do not use this in combination with the Robaxin, as these are the same medication class.  I suggest gentle stretching, warm compresses and orthopedic follow-up if your symptoms persist despite these interventions.  Ensure you are using proper lifting technique when at work.  Return to clinic for any new or urgent symptoms.

## 2023-08-22 ENCOUNTER — Ambulatory Visit: Payer: Medicaid Other | Admitting: Nurse Practitioner

## 2023-09-01 ENCOUNTER — Other Ambulatory Visit: Payer: Self-pay

## 2023-09-01 ENCOUNTER — Other Ambulatory Visit (HOSPITAL_COMMUNITY): Payer: Self-pay

## 2023-09-07 ENCOUNTER — Ambulatory Visit (HOSPITAL_COMMUNITY)
Admission: EM | Admit: 2023-09-07 | Discharge: 2023-09-07 | Disposition: A | Discharge status: Home / Self Care | Payer: Medicaid Other

## 2023-09-07 ENCOUNTER — Encounter (HOSPITAL_COMMUNITY): Payer: Self-pay | Admitting: Emergency Medicine

## 2023-09-07 DIAGNOSIS — R252 Cramp and spasm: Secondary | ICD-10-CM | POA: Diagnosis not present

## 2023-09-07 NOTE — Discharge Instructions (Addendum)
For your hand and feet cramping I suggest stretching and ensuring adequate electrolytes.  You can add a sugar-free Gatorade, Pedialyte or foods with high potassium like spinach, bananas, oranges, milk, yogurt, chicken, avocados, apples and other fruits or root vegetables.  Wearing warm socks and warm gloves can help if this is associated with the freezer at work as well as taking breaks from the freezer.  Please follow-up with your primary care provider if your symptoms persist.

## 2023-09-07 NOTE — ED Provider Notes (Signed)
[facility]    CSN: [id number] Arrival date & time: 09/07/23  1758      History   Chief Complaint No chief complaint on file.   HPI Willie Campbell is a 52 y.o. male.   Patient presents to clinic for bilateral hand and feet cramping that started last Thursday.  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  He did not go to work Thursday or Friday and is requesting a work note for today as well.  He does wear gloves and thick socks at work.  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  He denies any vomiting or diarrhea.  He is not diabetic.  No current hand or foot cramping.  The history is provided by the patient and medical records.    Past Medical History:  Diagnosis Date   CAD (coronary artery disease)    Cardiac arrest (HCC) 02/26/2022   CHF (congestive heart failure) (HCC)    STEMI (ST elevation myocardial infarction) (HCC) 02/26/2022    Patient Active Problem List   Diagnosis Date Noted   Cardiac arrest (HCC) 02/26/2022   STEMI (ST elevation myocardial infarction) (HCC) 02/26/2022   Acute systolic heart failure (HCC)    Cardiogenic shock (HCC)     Past Surgical History:  Procedure Laterality Date   CORONARY STENT INTERVENTION N/A 02/26/2022   Procedure: CORONARY STENT INTERVENTION;  Surgeon: Lyn Records, MD;  [facility];  Service: Cardiovascular;  Laterality: N/A;   HERNIA REPAIR     RIGHT/LEFT HEART CATH AND CORONARY ANGIOGRAPHY N/A 02/26/2022   Procedure: RIGHT/LEFT HEART CATH AND CORONARY ANGIOGRAPHY;  Surgeon: Lyn Records, MD;  [facility];  Service: Cardiovascular;  Laterality: N/A;       Home Medications    Prior to Admission medications   Medication Sig Start Date End Date Taking? Authorizing Provider  aspirin (ASPIRIN LOW DOSE) 81 MG chewable tablet Chew 1 tablet (81 mg total) by mouth daily. NEEDS FOLLOW UP APPOINTMENT FOR ANYMORE  REFILLS 03/28/23   Andrey Farmer, PA-C  aspirin 81 MG chewable tablet Chew 1 tablet (81 mg total) by mouth and swallow daily. 06/21/23     atorvastatin (LIPITOR) 40 MG tablet Take 1 tablet (40 mg total) by mouth daily. 11/11/22   Andrey Farmer, PA-C  atorvastatin (LIPITOR) 40 MG tablet Take 1 tablet (40 mg total) by mouth daily. 06/21/23     carvedilol (COREG) 3.125 MG tablet Take 1 tablet (3.125 mg total) by mouth 2 (two) times daily with a meal. 11/11/22   Andrey Farmer, PA-C  carvedilol (COREG) 3.125 MG tablet Take 1 tablet (3.125 mg total) by mouth in the morning and 1 tablet (3.125 mg total) in the evening. Take with meals. 06/21/23     cyclobenzaprine (FLEXERIL) 10 MG tablet Take 1 tablet (10 mg total) by mouth 2 (two) times daily as needed for muscle spasms. 08/17/23   Reegan Mctighe, [place] N, FNP  empagliflozin (JARDIANCE) 10 MG TABS tablet Take 1 tablet (10 mg total) by mouth daily. NEEDS FOLLOW UP APPOINTMENT FOR MORE REFILLS 03/28/23   Andrey Farmer, PA-C  ENTRESTO 24-26 MG Take 1 tablet by mouth 2 (two) times daily. 06/21/23     methocarbamol (ROBAXIN) 500 MG tablet Take 1 tablet (500 mg total) by mouth every 8 (eight) hours as needed for muscle spasms. 07/18/23   Landis Martins, PA-C  nitroGLYCERIN (NITROSTAT) 0.4 MG SL tablet Place 1 tablet (0.4 mg total) under the tongue every 5 (five) minutes as needed for chest pain. NEEDS FOLLOW UP APPOINTMENT FOR MORE REFILLS 03/28/23 03/27/24  Andrey Farmer, PA-C  nitroGLYCERIN (NITROSTAT) 0.4 MG SL tablet Place 1 tablet (0.4 mg total) under the tongue every 5 (five) minutes as needed for chest pain. 06/21/23     sacubitril-valsartan (ENTRESTO) 24-26 MG Take 1 tablet by mouth 2 (two) times daily. 05/26/23   Rodriguez-Southworth, Nettie Elm, PA-C  spironolactone (ALDACTONE) 25 MG tablet Take 1 tablet (25 mg total) by mouth daily. 02/21/23   Bensimhon, Bevelyn Buckles, MD  spironolactone (ALDACTONE) 25 MG tablet Take 1 tablet (25 mg total) by  mouth daily. 06/21/23     ticagrelor (BRILINTA) 90 MG TABS tablet Take 1 tablet (90 mg total) by mouth 2 (two) times daily. 03/22/23   Bensimhon, Bevelyn Buckles, MD  ticagrelor (BRILINTA) 90 MG TABS tablet Take 1 tablet (90 mg total) by mouth 2 (two) times daily. 06/21/23       Family History History reviewed. No pertinent family history.  Social History Social History   Tobacco Use   Smoking status: Some Days    Current packs/day: 0.50    Average packs/day: 0.5 packs/day for 15.0 years (7.5 ttl pk-yrs)    Types: Cigarettes   Smokeless tobacco: Never  Vaping Use   Vaping status: Never Used  Substance Use Topics   Alcohol use: Yes    Comment: occasionally   Drug use: No     Allergies   Bee venom   Review of Systems Review of Systems  Per HPI  Physical Exam Triage Vital Signs ED Triage Vitals  Encounter Vitals Group     BP 09/07/23 1913 121/84     Systolic BP Percentile --      Diastolic BP Percentile --      Pulse Rate 09/07/23 1913 87     Resp 09/07/23 1913 16     Temp 09/07/23 1913 (!) 97.5 F (36.4 C)     Temp Source 09/07/23 1913 Oral     SpO2 09/07/23 1913 94 %     Weight --      Height --      Head Circumference --      Peak Flow --      Pain Score 09/07/23 1912 8     Pain Loc --      Pain Education --      Exclude from Growth Chart --    No data found.  Updated Vital Signs BP 121/84 (BP Location: Right Arm)   Pulse 87   Temp (!) 97.5 F (36.4 C) (Oral)   Resp 16   SpO2 94%   Visual Acuity Right Eye Distance:   Left Eye Distance:   Bilateral Distance:    Right Eye Near:   Left Eye Near:    Bilateral Near:     Physical Exam Vitals and nursing note reviewed.  Constitutional:      Appearance: Normal appearance.  HENT:     Head: Normocephalic and atraumatic.     Right Ear: External ear normal.     Left Ear: External ear normal.     Nose: Nose normal.     Mouth/Throat:     Mouth: Mucous membranes are moist.  Eyes:     Conjunctiva/sclera:  Conjunctivae normal.  Cardiovascular:     Rate and Rhythm: Normal rate.     Pulses: Normal pulses.  Pulmonary:  Effort: Pulmonary effort is normal. No respiratory distress.  Musculoskeletal:        General: No swelling, tenderness, deformity or signs of injury. Normal range of motion.     Right lower leg: No edema.     Left lower leg: No edema.  Skin:    General: Skin is warm and dry.     Capillary Refill: Capillary refill takes less than 2 seconds.  Neurological:     General: No focal deficit present.     Mental Status: He is alert and oriented to person, place, and time.  Psychiatric:        Mood and Affect: Mood normal.        Behavior: Behavior normal. Behavior is cooperative.      UC Treatments / Results  Labs (all labs ordered are listed, but only abnormal results are displayed) Labs Reviewed - No data to display  EKG   Radiology No results found.  Procedures Procedures (including critical care time)  Medications Ordered in UC Medications - No data to display  Initial Impression / Assessment and Plan / UC Course  I have reviewed the triage vital signs and the nursing notes.  Pertinent labs & imaging results that were available during my care of the patient were reviewed by me and considered in my medical decision making (see chart for details).  Vitals and triage reviewed, patient is hemodynamically stable.  No current cramping on physical exam.  Strength 5 out of 5 in upper and lower extremities.  Pulses are 2+ at radial and pedal sites bilaterally.  Brisk capillary refill.  Low concern for frostbite, as sensation is intact and edges been over a week now since he was at the freezers at work, wearing gloves and thick socks.  Reassurance provided.  Encouraged to follow-up with PCP if cramping persists.  Work note provided.  Plan of care, follow-up care and return precautions given, no questions at this time.     Final Clinical Impressions(s) / UC Diagnoses    Final diagnoses:  Cramping of hands  Cramping of feet     Discharge Instructions      For your hand and feet cramping I suggest stretching and ensuring adequate electrolytes.  You can add a sugar-free Gatorade, Pedialyte or foods with high potassium like spinach, bananas, oranges, milk, yogurt, chicken, avocados, apples and other fruits or root vegetables.  Wearing warm socks and warm gloves can help if this is associated with the freezer at work as well as taking breaks from the freezer.  Please follow-up with your primary care provider if your symptoms persist.     ED Prescriptions   None    PDMP not reviewed this encounter.   Donnetta Gillin, [place] N, [place] 09/07/23 [phone]

## 2023-09-07 NOTE — ED Triage Notes (Addendum)
Pt c/o hands and feet have been hurting and cramping on and off since Friday.  He needs a note for work.

## 2023-09-07 NOTE — ED Notes (Signed)
Work note reviewed

## 2023-09-16 ENCOUNTER — Encounter (HOSPITAL_COMMUNITY): Payer: Self-pay

## 2023-09-16 ENCOUNTER — Ambulatory Visit (HOSPITAL_COMMUNITY)
Admission: EM | Admit: 2023-09-16 | Discharge: 2023-09-16 | Disposition: A | Discharge status: Home / Self Care | Payer: Medicaid Other | Attending: Internal Medicine | Admitting: Internal Medicine

## 2023-09-16 DIAGNOSIS — R252 Cramp and spasm: Secondary | ICD-10-CM | POA: Diagnosis not present

## 2023-09-16 LAB — COMPREHENSIVE METABOLIC PANEL
ALT: 21 U/L (ref 0–44)
AST: 20 U/L (ref 15–41)
Albumin: 3.7 g/dL (ref 3.5–5.0)
Alkaline Phosphatase: 102 U/L (ref 38–126)
Anion gap: 7 (ref 5–15)
BUN: 12 mg/dL (ref 6–20)
CO2: 29 mmol/L (ref 22–32)
Calcium: 9.5 mg/dL (ref 8.9–10.3)
Chloride: 101 mmol/L (ref 98–111)
Creatinine, Ser: 1.39 mg/dL — ABNORMAL HIGH (ref 0.61–1.24)
GFR, Estimated: >60 mL/min (ref >60)
Glucose, Bld: 72 mg/dL (ref 70–99)
Potassium: 4 mmol/L (ref 3.5–5.1)
Sodium: 137 mmol/L (ref 135–145)
Total Bilirubin: 1 mg/dL (ref 0.3–1.2)
Total Protein: 7 g/dL (ref 6.5–8.1)

## 2023-09-16 LAB — MAGNESIUM: Magnesium: 2.2 mg/dL (ref 1.7–2.4)

## 2023-09-16 NOTE — Discharge Instructions (Addendum)
Take an over the counter magnesium supplement

## 2023-09-16 NOTE — ED Triage Notes (Signed)
Patient having spasm in his hands and toes. 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 Onset with symptoms Wednesday. 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

## 2023-09-16 NOTE — ED Provider Notes (Signed)
[facility]    CSN: [id number] Arrival date & time: 09/16/23  1704      History   Chief Complaint Chief Complaint  Patient presents with   Spasms    HPI Willie Campbell is a 52 y.o. male.   The history is provided by the patient.   Has been getting muscle spasms in fingers and toes off and on for several weeks.  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  Does wear protective gear including a suit and gloves.  The medications but none of them are new.  Denies diarrhea, GI symptoms, the counter supplements.  Past Medical History:  Diagnosis Date   CAD (coronary artery disease)    Cardiac arrest (HCC) 02/26/2022   CHF (congestive heart failure) (HCC)    STEMI (ST elevation myocardial infarction) (HCC) 02/26/2022    Patient Active Problem List   Diagnosis Date Noted   Cardiac arrest (HCC) 02/26/2022   STEMI (ST elevation myocardial infarction) (HCC) 02/26/2022   Acute systolic heart failure (HCC)    Cardiogenic shock (HCC)     Past Surgical History:  Procedure Laterality Date   CORONARY STENT INTERVENTION N/A 02/26/2022   Procedure: CORONARY STENT INTERVENTION;  Surgeon: Lyn Records, MD;  [facility];  Service: Cardiovascular;  Laterality: N/A;   HERNIA REPAIR     RIGHT/LEFT HEART CATH AND CORONARY ANGIOGRAPHY N/A 02/26/2022   Procedure: RIGHT/LEFT HEART CATH AND CORONARY ANGIOGRAPHY;  Surgeon: Lyn Records, MD;  [facility];  Service: Cardiovascular;  Laterality: N/A;       Home Medications    Prior to Admission medications   Medication Sig Start Date End Date Taking? Authorizing Provider  aspirin (ASPIRIN LOW DOSE) 81 MG chewable tablet Chew 1 tablet (81 mg total) by mouth daily. NEEDS FOLLOW UP APPOINTMENT FOR ANYMORE REFILLS 03/28/23  Yes Andrey Farmer, PA-C  aspirin 81 MG chewable tablet Chew 1 tablet (81 mg total) by mouth and swallow  daily. 06/21/23  Yes   atorvastatin (LIPITOR) 40 MG tablet Take 1 tablet (40 mg total) by mouth daily. 11/11/22  Yes Andrey Farmer, PA-C  atorvastatin (LIPITOR) 40 MG tablet Take 1 tablet (40 mg total) by mouth daily. 06/21/23  Yes   carvedilol (COREG) 3.125 MG tablet Take 1 tablet (3.125 mg total) by mouth 2 (two) times daily with a meal. 11/11/22  Yes Andrey Farmer, PA-C  carvedilol (COREG) 3.125 MG tablet Take 1 tablet (3.125 mg total) by mouth in the morning and 1 tablet (3.125 mg total) in the evening. Take with meals. 06/21/23  Yes   cyclobenzaprine (FLEXERIL) 10 MG tablet Take 1 tablet (10 mg total) by mouth 2 (two) times daily as needed for muscle spasms. 08/17/23  Yes Rinaldo Ratel, [place] N, FNP  empagliflozin (JARDIANCE) 10 MG TABS tablet Take 1 tablet (10 mg total) by mouth daily. NEEDS FOLLOW UP APPOINTMENT FOR MORE REFILLS 03/28/23  Yes Andrey Farmer, PA-C  ENTRESTO 24-26 MG Take 1 tablet by mouth 2 (two) times daily. 06/21/23  Yes   methocarbamol (ROBAXIN) 500 MG tablet Take 1 tablet (500 mg total) by mouth every 8 (eight) hours as needed for muscle spasms. 07/18/23  Yes White, Elizabeth A, PA-C  nitroGLYCERIN (NITROSTAT) 0.4 MG SL tablet Place 1 tablet (0.4 mg total) under the tongue every 5 (five) minutes as needed for chest  pain. NEEDS FOLLOW UP APPOINTMENT FOR MORE REFILLS 03/28/23 03/27/24 Yes Andrey Farmer, PA-C  sacubitril-valsartan (ENTRESTO) 24-26 MG Take 1 tablet by mouth 2 (two) times daily. 05/26/23  Yes Rodriguez-Southworth, Nettie Elm, PA-C  spironolactone (ALDACTONE) 25 MG tablet Take 1 tablet (25 mg total) by mouth daily. 02/21/23  Yes Bensimhon, Bevelyn Buckles, MD  ticagrelor (BRILINTA) 90 MG TABS tablet Take 1 tablet (90 mg total) by mouth 2 (two) times daily. 03/22/23  Yes Bensimhon, Bevelyn Buckles, MD  nitroGLYCERIN (NITROSTAT) 0.4 MG SL tablet Place 1 tablet (0.4 mg total) under the tongue every 5 (five) minutes as needed for chest pain. 06/21/23     spironolactone  (ALDACTONE) 25 MG tablet Take 1 tablet (25 mg total) by mouth daily. 06/21/23     ticagrelor (BRILINTA) 90 MG TABS tablet Take 1 tablet (90 mg total) by mouth 2 (two) times daily. 06/21/23       Family History History reviewed. No pertinent family history.  Social History Social History   Tobacco Use   Smoking status: Some Days    Current packs/day: 0.50    Average packs/day: 0.5 packs/day for 15.0 years (7.5 ttl pk-yrs)    Types: Cigarettes   Smokeless tobacco: Never  Vaping Use   Vaping status: Never Used  Substance Use Topics   Alcohol use: Yes    Comment: occasionally   Drug use: No     Allergies   Bee venom   Review of Systems Review of Systems  Constitutional:  Negative for fatigue.  Gastrointestinal:  Negative for diarrhea and nausea.  Neurological:  Negative for dizziness, weakness, numbness and headaches.     Physical Exam Triage Vital Signs ED Triage Vitals  Encounter Vitals Group     BP 09/16/23 1820 115/76     Systolic BP Percentile --      Diastolic BP Percentile --      Pulse Rate 09/16/23 1820 92     Resp 09/16/23 1820 18     Temp 09/16/23 1820 97.8 F (36.6 C)     Temp Source 09/16/23 1820 Oral     SpO2 09/16/23 1820 94 %     Weight 09/16/23 1820 209 lb (94.8 kg)     Height 09/16/23 1820 5\' 10"  (1.778 m)     Head Circumference --      Peak Flow --      Pain Score 09/16/23 1817 0     Pain Loc --      Pain Education --      Exclude from Growth Chart --    No data found.  Updated Vital Signs BP 115/76 (BP Location: Left Arm)   Pulse 92   Temp 97.8 F (36.6 C) (Oral)   Resp 18   Ht 5\' 10"  (1.778 m)   Wt 209 lb (94.8 kg)   SpO2 94%   BMI 29.99 kg/m   Visual Acuity Right Eye Distance:   Left Eye Distance:   Bilateral Distance:    Right Eye Near:   Left Eye Near:    Bilateral Near:     Physical Exam Vitals and nursing note reviewed.  HENT:     Head: Atraumatic.  Cardiovascular:     Rate and Rhythm: Normal rate and regular  rhythm.     Heart sounds: Normal heart sounds.  Pulmonary:     Effort: Pulmonary effort is normal. No respiratory distress.     Breath sounds: Normal breath sounds. No wheezing or rales.  Musculoskeletal:  General: Normal range of motion.  Skin:    General: Skin is warm and dry.  Neurological:     Mental Status: He is oriented to person, place, and time.  Psychiatric:        Mood and Affect: Mood normal.      UC Treatments / Results  Labs (all labs ordered are listed, but only abnormal results are displayed) Labs Reviewed  COMPREHENSIVE METABOLIC PANEL  MAGNESIUM    EKG   Radiology No results found.  Procedures Procedures (including critical care time)  Medications Ordered in UC Medications - No data to display  Initial Impression / Assessment and Plan / UC Course  I have reviewed the triage vital signs and the nursing notes.  Pertinent labs & imaging results that were available during my care of the patient were reviewed by me and considered in my medical decision making (see chart for details).    Patient reports intermittent muscle spasms for several months.  Patient's were reviewed it is possible spasms or side effect of one of his medications.  Will check electrolytes and magnesium.  He was counseled to follow-up with his cardiologist.  Test results will be available on MyChart we will contact patient if there is a significant abnormality Final Clinical Impressions(s) / UC Diagnoses   Final diagnoses:  Muscle cramp     Discharge Instructions      Take an over-the-counter magnesium supplement   ED Prescriptions   None    PDMP not reviewed this encounter.   Meliton Rattan, [place] 09/16/23 1851

## 2023-09-17 ENCOUNTER — Telehealth: Payer: Self-pay | Admitting: Physician Assistant

## 2023-09-17 NOTE — Telephone Encounter (Signed)
Called and spoke with patient, his creatinine was slightly elevated today but similar to prior readings, he was instructed to follow-up with his PCP.  Otherwise his electrolytes including sodium potassium and magnesium were normal.

## 2023-09-23 DIAGNOSIS — R252 Cramp and spasm: Secondary | ICD-10-CM | POA: Diagnosis not present

## 2023-09-27 ENCOUNTER — Ambulatory Visit: Payer: Medicaid Other | Attending: Nurse Practitioner | Admitting: Nurse Practitioner

## 2023-09-27 ENCOUNTER — Other Ambulatory Visit (HOSPITAL_COMMUNITY): Payer: Self-pay

## 2023-09-27 ENCOUNTER — Encounter: Payer: Self-pay | Admitting: Nurse Practitioner

## 2023-09-27 VITALS — BP 134/87 | HR 88 | Ht 70.0 in | Wt 204.1 lb

## 2023-09-27 DIAGNOSIS — I5022 Chronic systolic (congestive) heart failure: Secondary | ICD-10-CM | POA: Diagnosis not present

## 2023-09-27 DIAGNOSIS — D72829 Elevated white blood cell count, unspecified: Secondary | ICD-10-CM

## 2023-09-27 DIAGNOSIS — Z1211 Encounter for screening for malignant neoplasm of colon: Secondary | ICD-10-CM | POA: Diagnosis not present

## 2023-09-27 DIAGNOSIS — Z111 Encounter for screening for respiratory tuberculosis: Secondary | ICD-10-CM

## 2023-09-27 DIAGNOSIS — I10 Essential (primary) hypertension: Secondary | ICD-10-CM | POA: Diagnosis not present

## 2023-09-27 MED ORDER — EMPAGLIFLOZIN 10 MG PO TABS
10.0000 mg | ORAL_TABLET | Freq: Every day | ORAL | 1 refills | Status: DC
  Filled 2023-09-27: qty 90, 90d supply, fill #0

## 2023-09-27 MED ORDER — ENTRESTO 24-26 MG PO TABS
1.0000 | ORAL_TABLET | Freq: Two times a day (BID) | ORAL | 1 refills | Status: AC
  Filled 2023-09-27: qty 180, 90d supply, fill #0

## 2023-09-27 MED ORDER — CARVEDILOL 3.125 MG PO TABS
3.1250 mg | ORAL_TABLET | Freq: Two times a day (BID) | ORAL | 1 refills | Status: DC
  Filled 2023-09-27: qty 120, 60d supply, fill #0

## 2023-09-27 MED ORDER — SPIRONOLACTONE 25 MG PO TABS
25.0000 mg | ORAL_TABLET | Freq: Every day | ORAL | 1 refills | Status: DC
  Filled 2023-09-27: qty 90, 90d supply, fill #0

## 2023-09-27 NOTE — Progress Notes (Signed)
Assessment & Plan:  Willie Campbell was seen today for medical management of chronic issues.  Diagnoses and all orders for this visit:  Primary hypertension Follow up with cardiology as soon as possible.   Chronic systolic heart failure (HCC) -     spironolactone (ALDACTONE) 25 MG tablet; Take 1 tablet (25 mg total) by mouth daily. -     empagliflozin (JARDIANCE) 10 MG TABS tablet; Take 1 tablet (10 mg total) by mouth daily. -     carvedilol (COREG) 3.125 MG tablet; Take 1 tablet (3.125 mg total) by mouth in the morning and 1 tablet (3.125 mg total) in the evening. Take with meals. -     CMP14+EGFR -     ENTRESTO 24-26 MG; Take 1 tablet by mouth 2 (two) times daily.  Colon cancer screening -     Ambulatory referral to Gastroenterology  Leukocytosis, unspecified type -     CBC with Differential  Screening-pulmonary TB -     PPD    Patient has been counseled on age-appropriate routine health concerns for screening and prevention. These are reviewed and up-to-date. Referrals have been placed accordingly. Immunizations are up-to-date or declined.    Subjective:   Chief Complaint  Patient presents with   Medical Management of Chronic Issues    Willie Campbell 52 y.o. male presents to office today for follow up to HTN.  PMH: GERD, Tobacco dependnece, CAD, Cardiac arrest, CHF, STEMI, PCE/DES LAD.   Blood pressure is slightly elevated above normal today.  He is overdue for follow up with Cardiology and has been instructed to follow up as soon as possible. Taking carvedilol 3.125 mg BID, Jardiance 10 mg daily, entresto 24-26 mg BID and spironolactone 25 mg daily.  BP Readings from Last 3 Encounters:  09/27/23 134/87  09/16/23 115/76  09/07/23 121/84     Review of Systems  Constitutional:  Negative for fever, malaise/fatigue and weight loss.  HENT: Negative.  Negative for nosebleeds.   Eyes: Negative.  Negative for blurred vision, double vision and photophobia.  Respiratory: Negative.   Negative for cough and shortness of breath.   Cardiovascular: Negative.  Negative for chest pain, palpitations and leg swelling.  Gastrointestinal: Negative.  Negative for heartburn, nausea and vomiting.  Musculoskeletal: Negative.  Negative for myalgias.  Neurological: Negative.  Negative for dizziness, focal weakness, seizures and headaches.  Psychiatric/Behavioral: Negative.  Negative for suicidal ideas.     Past Medical History:  Diagnosis Date   CAD (coronary artery disease)    Cardiac arrest (HCC) 02/26/2022   CHF (congestive heart failure) (HCC)    STEMI (ST elevation myocardial infarction) (HCC) 02/26/2022    Past Surgical History:  Procedure Laterality Date   CORONARY STENT INTERVENTION N/A 02/26/2022   Procedure: CORONARY STENT INTERVENTION;  Surgeon: Lyn Records, MD;  [facility];  Service: Cardiovascular;  Laterality: N/A;   HERNIA REPAIR     RIGHT/LEFT HEART CATH AND CORONARY ANGIOGRAPHY N/A 02/26/2022   Procedure: RIGHT/LEFT HEART CATH AND CORONARY ANGIOGRAPHY;  Surgeon: Lyn Records, MD;  [facility];  Service: Cardiovascular;  Laterality: N/A;    History reviewed. No pertinent family history.  Social History Reviewed with no changes to be made today.   Outpatient Medications Prior to Visit  Medication Sig Dispense Refill   aspirin 81 MG chewable tablet Chew 1 tablet (81 mg total) by mouth and swallow daily. 60 tablet 0   atorvastatin (LIPITOR) 40 MG tablet Take 1 tablet (  40 mg total) by mouth daily. 60 tablet 0   cyclobenzaprine (FLEXERIL) 10 MG tablet Take 1 tablet (10 mg total) by mouth 2 (two) times daily as needed for muscle spasms. 20 tablet 0   nitroGLYCERIN (NITROSTAT) 0.4 MG SL tablet Place 1 tablet (0.4 mg total) under the tongue every 5 (five) minutes as needed for chest pain. 25 tablet 1   ticagrelor (BRILINTA) 90 MG TABS tablet Take 1 tablet (90 mg total) by mouth 2 (two) times daily. 180 tablet 3   aspirin (ASPIRIN  LOW DOSE) 81 MG chewable tablet Chew 1 tablet (81 mg total) by mouth daily. NEEDS FOLLOW UP APPOINTMENT FOR ANYMORE REFILLS 60 tablet 0   atorvastatin (LIPITOR) 40 MG tablet Take 1 tablet (40 mg total) by mouth daily. 30 tablet 5   carvedilol (COREG) 3.125 MG tablet Take 1 tablet (3.125 mg total) by mouth 2 (two) times daily with a meal. 62 tablet 5   carvedilol (COREG) 3.125 MG tablet Take 1 tablet (3.125 mg total) by mouth in the morning and 1 tablet (3.125 mg total) in the evening. Take with meals. 120 tablet 0   empagliflozin (JARDIANCE) 10 MG TABS tablet Take 1 tablet (10 mg total) by mouth daily. NEEDS FOLLOW UP APPOINTMENT FOR MORE REFILLS 60 tablet 0   ENTRESTO 24-26 MG Take 1 tablet by mouth 2 (two) times daily. 120 tablet 0   methocarbamol (ROBAXIN) 500 MG tablet Take 1 tablet (500 mg total) by mouth every 8 (eight) hours as needed for muscle spasms. 15 tablet 0   nitroGLYCERIN (NITROSTAT) 0.4 MG SL tablet Place 1 tablet (0.4 mg total) under the tongue every 5 (five) minutes as needed for chest pain. NEEDS FOLLOW UP APPOINTMENT FOR MORE REFILLS 25 tablet 0   sacubitril-valsartan (ENTRESTO) 24-26 MG Take 1 tablet by mouth 2 (two) times daily. 30 tablet 0   spironolactone (ALDACTONE) 25 MG tablet Take 1 tablet (25 mg total) by mouth daily. 90 tablet 5   spironolactone (ALDACTONE) 25 MG tablet Take 1 tablet (25 mg total) by mouth daily. 60 tablet 0   ticagrelor (BRILINTA) 90 MG TABS tablet Take 1 tablet (90 mg total) by mouth 2 (two) times daily. 120 tablet 0   No facility-administered medications prior to visit.    Allergies  Allergen Reactions   Bee Venom Hives       Objective:    BP 134/87   Pulse 88   Ht 5\' 10"  (1.778 m)   Wt 204 lb 1.6 oz (92.6 kg)   SpO2 98%   BMI 29.29 kg/m  Wt Readings from Last 3 Encounters:  09/27/23 204 lb 1.6 oz (92.6 kg)  09/16/23 209 lb (94.8 kg)  01/24/23 210 lb 9.6 oz (95.5 kg)    Physical Exam Vitals and nursing note reviewed.   Constitutional:      Appearance: He is well-developed.  HENT:     Head: Normocephalic and atraumatic.  Cardiovascular:     Rate and Rhythm: Normal rate and regular rhythm.     Heart sounds: Normal heart sounds. No murmur heard.    No friction rub. No gallop.  Pulmonary:     Effort: Pulmonary effort is normal. No tachypnea or respiratory distress.     Breath sounds: Normal breath sounds. No decreased breath sounds, wheezing, rhonchi or rales.  Chest:     Chest wall: No tenderness.  Abdominal:     General: Bowel sounds are normal.     Palpations: Abdomen is soft.  Musculoskeletal:        General: Normal range of motion.     Cervical back: Normal range of motion.  Skin:    General: Skin is warm and dry.  Neurological:     Mental Status: He is alert and oriented to person, place, and time.     Coordination: Coordination normal.  Psychiatric:        Behavior: Behavior normal. Behavior is cooperative.        Thought Content: Thought content normal.        Judgment: Judgment normal.          Patient has been counseled extensively about nutrition and exercise as well as the importance of adherence with medications and regular follow-up. The patient was given clear instructions to go to ER or return to medical center if symptoms don't improve, worsen or new problems develop. The patient verbalized understanding.   Follow-up: Return in about 3 months (around 12/28/2023).   Claiborne Rigg, FNP-BC [facility] [place], [place] [phone]   09/27/2023, 10:24 PM

## 2023-09-27 NOTE — Progress Notes (Signed)
Left forearm at 340pm.

## 2023-09-27 NOTE — Patient Instructions (Addendum)
CARDIOLOGIST Bevelyn Buckles. Bensimhon, MD Address: [address] [place], [place] [address] Phone: [phone]

## 2023-09-28 LAB — CBC WITH DIFFERENTIAL/PLATELET
Basophils Absolute: 0.1 10*3/uL (ref 0.0–0.2)
Basos: 1 %
EOS (ABSOLUTE): 0.1 10*3/uL (ref 0.0–0.4)
Eos: 1 %
Hematocrit: 46.4 % (ref 37.5–51.0)
Hemoglobin: 15.3 g/dL (ref 13.0–17.7)
Immature Grans (Abs): 0 10*3/uL (ref 0.0–0.1)
Immature Granulocytes: 0 %
Lymphocytes Absolute: 2.5 10*3/uL (ref 0.7–3.1)
Lymphs: 26 %
MCH: 32.6 pg (ref 26.6–33.0)
MCHC: 33 g/dL (ref 31.5–35.7)
MCV: 99 fL — ABNORMAL HIGH (ref 79–97)
Monocytes Absolute: 0.6 10*3/uL (ref 0.1–0.9)
Monocytes: 6 %
Neutrophils Absolute: 6.4 10*3/uL (ref 1.4–7.0)
Neutrophils: 66 %
Platelets: 275 10*3/uL (ref 150–450)
RBC: 4.69 x10E6/uL (ref 4.14–5.80)
RDW: 10.8 % — ABNORMAL LOW (ref 11.6–15.4)
WBC: 9.6 10*3/uL (ref 3.4–10.8)

## 2023-09-28 LAB — CMP14+EGFR
ALT: 22 [IU]/L (ref 0–44)
AST: 19 [IU]/L (ref 0–40)
Albumin: 4.6 g/dL (ref 3.8–4.9)
Alkaline Phosphatase: 124 [IU]/L — ABNORMAL HIGH (ref 44–121)
BUN/Creatinine Ratio: 8 — ABNORMAL LOW (ref 9–20)
BUN: 9 mg/dL (ref 6–24)
Bilirubin Total: 1 mg/dL (ref 0.0–1.2)
CO2: 24 mmol/L (ref 20–29)
Calcium: 9.8 mg/dL (ref 8.7–10.2)
Chloride: 100 mmol/L (ref 96–106)
Creatinine, Ser: 1.17 mg/dL (ref 0.76–1.27)
Globulin, Total: 2.8 g/dL (ref 1.5–4.5)
Glucose: 97 mg/dL (ref 70–99)
Potassium: 4.2 mmol/L (ref 3.5–5.2)
Sodium: 140 mmol/L (ref 134–144)
Total Protein: 7.4 g/dL (ref 6.0–8.5)
eGFR: 75 mL/min/{1.73_m2} (ref >59)

## 2023-09-29 ENCOUNTER — Ambulatory Visit: Payer: Medicaid Other | Attending: Nurse Practitioner

## 2023-09-29 LAB — TB SKIN TEST: TB Skin Test: NEGATIVE

## 2023-10-06 ENCOUNTER — Encounter: Payer: Self-pay | Admitting: *Deleted

## 2023-10-17 ENCOUNTER — Other Ambulatory Visit: Payer: Self-pay

## 2023-10-17 ENCOUNTER — Ambulatory Visit (HOSPITAL_COMMUNITY)
Admission: EM | Admit: 2023-10-17 | Discharge: 2023-10-17 | Disposition: A | Discharge status: Home / Self Care | Payer: Medicaid Other

## 2023-10-17 ENCOUNTER — Encounter (HOSPITAL_COMMUNITY): Payer: Self-pay | Admitting: *Deleted

## 2023-10-17 DIAGNOSIS — Z0289 Encounter for other administrative examinations: Secondary | ICD-10-CM

## 2023-10-17 NOTE — ED Triage Notes (Signed)
Pt requesting a work note . He works in the cold unit . PT has a list of problems.

## 2023-10-17 NOTE — Discharge Instructions (Signed)
Follow up with your Primary Care Physician for further work notes or FMLA paperwork

## 2023-10-17 NOTE — ED Provider Notes (Signed)
[facility]    CSN: [id number] Arrival date & time: 10/17/23  1818      History   Chief Complaint Chief Complaint  Patient presents with   work note    HPI FAIZAAN MABRA is a 52 y.o. male.   Patient presents requesting a work note.  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  He has been seen here multiple times for similar request.  No changes since last visit.  Patient does have a PCP.    Past Medical History:  Diagnosis Date   CAD (coronary artery disease)    Cardiac arrest (HCC) 02/26/2022   CHF (congestive heart failure) (HCC)    STEMI (ST elevation myocardial infarction) (HCC) 02/26/2022    Patient Active Problem List   Diagnosis Date Noted   STEMI (ST elevation myocardial infarction) (HCC) 02/26/2022   Acute systolic heart failure (HCC)     Past Surgical History:  Procedure Laterality Date   CORONARY STENT INTERVENTION N/A 02/26/2022   Procedure: CORONARY STENT INTERVENTION;  Surgeon: Lyn Records, MD;  [facility];  Service: Cardiovascular;  Laterality: N/A;   HERNIA REPAIR     RIGHT/LEFT HEART CATH AND CORONARY ANGIOGRAPHY N/A 02/26/2022   Procedure: RIGHT/LEFT HEART CATH AND CORONARY ANGIOGRAPHY;  Surgeon: Lyn Records, MD;  [facility];  Service: Cardiovascular;  Laterality: N/A;       Home Medications    Prior to Admission medications   Medication Sig Start Date End Date Taking? Authorizing Provider  aspirin 81 MG chewable tablet Chew 1 tablet (81 mg total) by mouth and swallow daily. 06/21/23  Yes   atorvastatin (LIPITOR) 40 MG tablet Take 1 tablet (40 mg total) by mouth daily. 06/21/23  Yes   carvedilol (COREG) 3.125 MG tablet Take 1 tablet (3.125 mg total) by mouth in the morning and 1 tablet (3.125 mg total) in the evening. Take with meals. 09/27/23  Yes Claiborne Rigg, NP  spironolactone (ALDACTONE) 25 MG tablet Take 1 tablet (25 mg total) by mouth daily. 09/27/23   Yes Claiborne Rigg, NP  ticagrelor (BRILINTA) 90 MG TABS tablet Take 1 tablet (90 mg total) by mouth 2 (two) times daily. 03/22/23  Yes Bensimhon, Bevelyn Buckles, MD  cyclobenzaprine (FLEXERIL) 10 MG tablet Take 1 tablet (10 mg total) by mouth 2 (two) times daily as needed for muscle spasms. 08/17/23   Garrison, [place] N, FNP  empagliflozin (JARDIANCE) 10 MG TABS tablet Take 1 tablet (10 mg total) by mouth daily. 09/27/23   Claiborne Rigg, NP  ENTRESTO 24-26 MG Take 1 tablet by mouth 2 (two) times daily. 09/27/23   Claiborne Rigg, NP  nitroGLYCERIN (NITROSTAT) 0.4 MG SL tablet Place 1 tablet (0.4 mg total) under the tongue every 5 (five) minutes as needed for chest pain. 06/21/23       Family History History reviewed. No pertinent family history.  Social History Social History   Tobacco Use   Smoking status: Some Days    Current packs/day: 0.50    Average packs/day: 0.5 packs/day for 15.0 years (7.5 ttl pk-yrs)    Types: Cigarettes   Smokeless tobacco: Never  Vaping Use   Vaping status: Never Used  Substance Use Topics   Alcohol use: Yes    Comment: occasionally   Drug use: No     Allergies   Bee venom   Review of Systems Review of Systems  Constitutional:  Negative for chills and fever.  HENT:  Negative for ear pain and sore throat.   Eyes:  Negative for pain and visual disturbance.  Respiratory:  Negative for cough and shortness of breath.   Cardiovascular:  Negative for chest pain and palpitations.  Gastrointestinal:  Negative for abdominal pain and vomiting.  Genitourinary:  Negative for dysuria and hematuria.  Musculoskeletal:  Negative for arthralgias and back pain.  Skin:  Negative for color change and rash.  Neurological:  Negative for seizures and syncope.  All other systems reviewed and are negative.    Physical Exam Triage Vital Signs ED Triage Vitals  Encounter Vitals Group     BP 10/17/23 1908 (!) 149/99     Systolic BP Percentile --      Diastolic BP  Percentile --      Pulse Rate 10/17/23 1908 92     Resp 10/17/23 1908 20     Temp 10/17/23 1908 98.1 F (36.7 C)     Temp src --      SpO2 10/17/23 1908 98 %     Weight --      Height --      Head Circumference --      Peak Flow --      Pain Score 10/17/23 1906 0     Pain Loc --      Pain Education --      Exclude from Growth Chart --    No data found.  Updated Vital Signs BP (!) 149/99   Pulse 92   Temp 98.1 F (36.7 C)   Resp 20   SpO2 98%   Visual Acuity Right Eye Distance:   Left Eye Distance:   Bilateral Distance:    Right Eye Near:   Left Eye Near:    Bilateral Near:     Physical Exam Vitals and nursing note reviewed.  Constitutional:      General: He is not in acute distress.    Appearance: He is well-developed.  HENT:     Head: Normocephalic and atraumatic.  Eyes:     Conjunctiva/sclera: Conjunctivae normal.  Cardiovascular:     Rate and Rhythm: Normal rate and regular rhythm.     Heart sounds: No murmur heard. Pulmonary:     Effort: Pulmonary effort is normal. No respiratory distress.     Breath sounds: Normal breath sounds.  Abdominal:     Palpations: Abdomen is soft.     Tenderness: There is no abdominal tenderness.  Musculoskeletal:        General: No swelling.     Cervical back: Neck supple.  Skin:    General: Skin is warm and dry.     Capillary Refill: Capillary refill takes less than 2 seconds.  Neurological:     Mental Status: He is alert.  Psychiatric:        Mood and Affect: Mood normal.      UC Treatments / Results  Labs (all labs ordered are listed, but only abnormal results are displayed) Labs Reviewed - No data to display  EKG   Radiology No results found.  Procedures Procedures (including critical care time)  Medications Ordered in UC Medications - No data to display  Initial Impression / Assessment and Plan / UC Course  I have reviewed the triage vital signs and the nursing notes.  Pertinent labs & imaging  results that were available during my care of the patient were reviewed by me and considered in my medical decision  making (see chart for details).     Work note provided.  Patient advised to follow-up with his primary care physician for further work notes .  Final Clinical Impressions(s) / UC Diagnoses   Final diagnoses:  Encounter to obtain excuse from work     Discharge Instructions      Follow up with your Primary Care Physician for further work notes or FMLA paperwork   ED Prescriptions   None    PDMP not reviewed this encounter.   Ward, Tylene Fantasia, PA-C 10/17/23 1954

## 2023-11-23 ENCOUNTER — Ambulatory Visit (HOSPITAL_COMMUNITY)
Admission: EM | Admit: 2023-11-23 | Discharge: 2023-11-23 | Disposition: A | Discharge status: Home / Self Care | Payer: Medicaid Other | Attending: Family Medicine | Admitting: Family Medicine

## 2023-11-23 ENCOUNTER — Encounter (HOSPITAL_COMMUNITY): Payer: Self-pay

## 2023-11-23 DIAGNOSIS — J069 Acute upper respiratory infection, unspecified: Secondary | ICD-10-CM

## 2023-11-23 LAB — POCT INFLUENZA A/B
Influenza A, POC: NEGATIVE
Influenza B, POC: NEGATIVE

## 2023-11-23 MED ORDER — HYDROCODONE BIT-HOMATROP MBR 5-1.5 MG/5ML PO SOLN: 5.0000 mL | Freq: Four times a day (QID) | ORAL | 0 refills | Status: DC | PRN

## 2023-11-23 MED ORDER — ACETAMINOPHEN 325 MG PO TABS
ORAL_TABLET | ORAL | Status: AC
Start: 2023-11-23 — End: ?
  Filled 2023-11-23: qty 3

## 2023-11-23 MED ORDER — ACETAMINOPHEN 325 MG PO TABS
975.0000 mg | ORAL_TABLET | Freq: Once | ORAL | Status: AC
  Administered 2023-11-23: 975 mg via ORAL

## 2023-11-23 NOTE — ED Triage Notes (Signed)
 Patient here today with c/o cough, body aches, fever, chills, sweats, wheeze, and SOB X 3 days. No known sick contacts.

## 2023-11-23 NOTE — ED Provider Notes (Signed)
 [facility]   [id number] 11/23/23 Arrival Time: 1111  ASSESSMENT & PLAN:  1. Viral URI with cough    Discussed typical duration of likely viral illness. Results for orders placed or performed during the hospital encounter of 11/23/23  POC Influenza A/B   Collection Time: 11/23/23  1:32 PM  Result Value Ref Range   Influenza A, POC Negative Negative   Influenza B, POC Negative Negative   OTC symptom care as needed.  Discharge Medication List as of 11/23/2023  1:52 PM     START taking these medications   Details  HYDROcodone  bit-homatropine (HYCODAN) 5-1.5 MG/5ML syrup Take 5 mLs by mouth every 6 (six) hours as needed for cough., Starting Wed 11/23/2023, Normal         Follow-up Information     Theotis Haze ORN, NP.   Specialty: Nurse Practitioner Why: As needed. Contact information: [address] [place] [address] [place] [address] [phone]                 Reviewed expectations re: course of current medical issues. Questions answered. Outlined signs and symptoms indicating need for more acute intervention. Understanding verbalized. After Visit Summary given.   SUBJECTIVE: History from: Patient. Willie Campbell is a 53 y.o. male. Reports: cough, body aches, subj fever, chills; abrupt onset; x 3 days. Denies: difficulty breathing. Normal PO intake without n/v/d.  OBJECTIVE:  Vitals:   11/23/23 1251 11/23/23 1252  BP:  125/83  Pulse:  (!) 123  Resp:  16  Temp:  100 F (37.8 C)  TempSrc:  Oral  SpO2:  99%  Weight: 95.3 kg   Height: 5' 11 (1.803 m)     Recheck P: 104 General appearance: alert; no distress Eyes: PERRLA; EOMI; conjunctiva normal HENT: [place]; AT; with nasal congestion Neck: supple  Lungs: speaks full sentences without difficulty; unlabored; CTAB Extremities: no edema Skin: warm and dry Neurologic: normal gait Psychological: alert and cooperative; normal mood and affect  Labs: Results for orders placed or performed  during the hospital encounter of 11/23/23  POC Influenza A/B   Collection Time: 11/23/23  1:32 PM  Result Value Ref Range   Influenza A, POC Negative Negative   Influenza B, POC Negative Negative   Labs Reviewed  POCT INFLUENZA A/B     Allergies  Allergen Reactions   Bee Venom Hives    Past Medical History:  Diagnosis Date   CAD (coronary artery disease)    Cardiac arrest (HCC) 02/26/2022   CHF (congestive heart failure) (HCC)    STEMI (ST elevation myocardial infarction) (HCC) 02/26/2022   Social History   Socioeconomic History   Marital status: Single    Spouse name: Not on file   Number of children: Not on file   Years of education: Not on file   Highest education level: Not on file  Occupational History   Not on file  Tobacco Use   Smoking status: Some Days    Current packs/day: 0.50    Average packs/day: 0.5 packs/day for 15.0 years (7.5 ttl pk-yrs)    Types: Cigarettes   Smokeless tobacco: Never  Vaping Use   Vaping status: Never Used  Substance and Sexual Activity   Alcohol use: Yes    Comment: occasionally   Drug use: No   Sexual activity: Not on file  Other Topics Concern   Not on file  Social History Narrative   Not on file   Social Drivers of Health   Financial  Resource Strain: High Risk (03/15/2022)   Overall Financial Resource Strain (CARDIA)    Difficulty of Paying Living Expenses: Hard  Food Insecurity: Food Insecurity Present (09/27/2023)   Hunger Vital Sign    Worried About Running Out of Food in the Last Year: Often true    Ran Out of Food in the Last Year: Often true  Transportation Needs: Unmet Transportation Needs (09/27/2023)   PRAPARE - Administrator, Civil Service (Medical): Yes    Lack of Transportation (Non-Medical): Yes  Physical Activity: Not on file  Stress: Stress Concern Present (09/27/2023)   [employer] of Occupational Health - Occupational Stress Questionnaire    Feeling of Stress : To some extent   Social Connections: Moderately Isolated (09/27/2023)   Social Connection and Isolation Panel [NHANES]    Frequency of Communication with Friends and Family: Once a week    Frequency of Social Gatherings with Friends and Family: Once a week    Attends Religious Services: 1 to 4 times per year    Active Member of [employer] or Organizations: Yes    Attends Banker Meetings: 1 to 4 times per year    Marital Status: Never married  Intimate Partner Violence: Not At Risk (09/27/2023)   Humiliation, Afraid, Rape, and Kick questionnaire    Fear of Current or Ex-Partner: No    Emotionally Abused: No    Physically Abused: No    Sexually Abused: No   History reviewed. No pertinent family history. Past Surgical History:  Procedure Laterality Date   CORONARY STENT INTERVENTION N/A 02/26/2022   Procedure: CORONARY STENT INTERVENTION;  Surgeon: Claudene Victory ORN, MD;  [facility];  Service: Cardiovascular;  Laterality: N/A;   HERNIA REPAIR     RIGHT/LEFT HEART CATH AND CORONARY ANGIOGRAPHY N/A 02/26/2022   Procedure: RIGHT/LEFT HEART CATH AND CORONARY ANGIOGRAPHY;  Surgeon: Claudene Victory ORN, MD;  [facility];  Service: Cardiovascular;  Laterality: N/A;     Rolinda Rogue, MD 11/23/23 1554

## 2023-11-23 NOTE — Discharge Instructions (Signed)
 Be aware, your cough medication may cause drowsiness. Please do not drive, operate heavy machinery or make important decisions while on this medication, it can cloud your judgement.

## 2023-12-28 ENCOUNTER — Ambulatory Visit: Payer: Medicaid Other | Admitting: Nurse Practitioner

## 2024-05-18 ENCOUNTER — Ambulatory Visit (HOSPITAL_COMMUNITY)
Admission: EM | Admit: 2024-05-18 | Discharge: 2024-05-18 | Disposition: A | Discharge status: Home / Self Care | Attending: Physician Assistant | Admitting: Physician Assistant

## 2024-05-18 ENCOUNTER — Encounter (HOSPITAL_COMMUNITY): Payer: Self-pay

## 2024-05-18 DIAGNOSIS — J020 Streptococcal pharyngitis: Secondary | ICD-10-CM

## 2024-05-18 LAB — POCT RAPID STREP A (OFFICE): Rapid Strep A Screen: POSITIVE — AB

## 2024-05-18 MED ORDER — METHYLPREDNISOLONE SODIUM SUCC 125 MG IJ SOLR
80.0000 mg | Freq: Once | INTRAMUSCULAR | Status: AC
  Administered 2024-05-18: 80 mg via INTRAMUSCULAR

## 2024-05-18 MED ORDER — CEFTRIAXONE SODIUM 1 G IJ SOLR
INTRAMUSCULAR | Status: AC
  Filled 2024-05-18: qty 10

## 2024-05-18 MED ORDER — CEFTRIAXONE SODIUM 1 G IJ SOLR
1.0000 g | Freq: Once | INTRAMUSCULAR | Status: AC
  Administered 2024-05-18: 1 g via INTRAMUSCULAR

## 2024-05-18 MED ORDER — AMOXICILLIN-POT CLAVULANATE 400-57 MG/5ML PO SUSR: 875.0000 mg | Freq: Two times a day (BID) | ORAL | 0 refills | Status: AC

## 2024-05-18 MED ORDER — LIDOCAINE HCL (PF) 1 % IJ SOLN
INTRAMUSCULAR | Status: AC
  Filled 2024-05-18: qty 2

## 2024-05-18 MED ORDER — METHYLPREDNISOLONE SODIUM SUCC 125 MG IJ SOLR
INTRAMUSCULAR | Status: AC
  Filled 2024-05-18: qty 2

## 2024-05-18 NOTE — ED Triage Notes (Signed)
 Patient reports that he began having a sore throat last week and then today he has had voice change and states difficulty breathing and talking.  Patient is speaking in complete sentences and is able to swallow his own saliva. Patient came in with salt water and was gargling in the lobby bathroom.

## 2024-05-18 NOTE — ED Provider Notes (Signed)
 [facility]    CSN: [id number] Arrival date & time: 05/18/24  1133      History   Chief Complaint Chief Complaint  Patient presents with   Sore Throat    HPI Willie Campbell is a 53 y.o. male.   Patient presents today with a weeklong history of sore throat.  He reports that this involves his entire posterior oropharynx with pain rated 10 on a 0-10 pain scale, worse with swallowing, no alleviating factors identified.  He does report that his voice has recently changed prompting evaluation.  He has been able to gargle with warm salt water and manage his own secretions.  He has been drinking but has had limited solid food due to the pain with swallowing.  Denies any known sick contacts.  He denies any shortness of breath, fever, nausea, vomiting.  He denies any recent antibiotics or steroids.    Past Medical History:  Diagnosis Date   CAD (coronary artery disease)    Cardiac arrest (HCC) 02/26/2022   CHF (congestive heart failure) (HCC)    STEMI (ST elevation myocardial infarction) (HCC) 02/26/2022    Patient Active Problem List   Diagnosis Date Noted   STEMI (ST elevation myocardial infarction) (HCC) 02/26/2022   Acute systolic heart failure (HCC)     Past Surgical History:  Procedure Laterality Date   CORONARY STENT INTERVENTION N/A 02/26/2022   Procedure: CORONARY STENT INTERVENTION;  Surgeon: Claudene Victory ORN, MD;  [facility];  Service: Cardiovascular;  Laterality: N/A;   HERNIA REPAIR     RIGHT/LEFT HEART CATH AND CORONARY ANGIOGRAPHY N/A 02/26/2022   Procedure: RIGHT/LEFT HEART CATH AND CORONARY ANGIOGRAPHY;  Surgeon: Claudene Victory ORN, MD;  [facility];  Service: Cardiovascular;  Laterality: N/A;       Home Medications    Prior to Admission medications   Medication Sig Start Date End Date Taking? Authorizing Provider  amoxicillin -clavulanate (AUGMENTIN ) 400-57 MG/5ML suspension Take 10.9 mLs (875 mg total) by mouth 2 (two)  times daily for 10 days. 05/18/24 05/28/24 Yes Retta Pitcher, Rocky MARLA, PA-C  aspirin  81 MG chewable tablet Chew 1 tablet (81 mg total) by mouth and swallow daily. 06/21/23     atorvastatin  (LIPITOR) 40 MG tablet Take 1 tablet (40 mg total) by mouth daily. 06/21/23     carvedilol  (COREG ) 3.125 MG tablet Take 1 tablet (3.125 mg total) by mouth in the morning and 1 tablet (3.125 mg total) in the evening. Take with meals. 09/27/23   Fleming, Zelda W, NP  empagliflozin  (JARDIANCE ) 10 MG TABS tablet Take 1 tablet (10 mg total) by mouth daily. 09/27/23   Theotis Haze ORN, NP  ENTRESTO  24-26 MG Take 1 tablet by mouth 2 (two) times daily. 09/27/23   Fleming, Zelda W, NP  nitroGLYCERIN  (NITROSTAT ) 0.4 MG SL tablet Place 1 tablet (0.4 mg total) under the tongue every 5 (five) minutes as needed for chest pain. 06/21/23     spironolactone  (ALDACTONE ) 25 MG tablet Take 1 tablet (25 mg total) by mouth daily. 09/27/23   Fleming, Zelda W, NP  ticagrelor  (BRILINTA ) 90 MG TABS tablet Take 1 tablet (90 mg total) by mouth 2 (two) times daily. 03/22/23   Bensimhon, Toribio SAUNDERS, MD    Family History History reviewed. No pertinent family history.  Social History Social History   Tobacco Use   Smoking status: Some Days    Current packs/day: 0.50    Average packs/day: 0.5 packs/day for 15.0 years (7.5  ttl pk-yrs)    Types: Cigarettes   Smokeless tobacco: Never  Vaping Use   Vaping status: Never Used  Substance Use Topics   Alcohol use: Yes    Comment: occasionally   Drug use: No     Allergies   Bee venom   Review of Systems Review of Systems  Constitutional:  Positive for activity change. Negative for appetite change, fatigue and fever.  HENT:  Positive for sore throat, trouble swallowing (due to pain) and voice change.   Respiratory:  Negative for shortness of breath.   Cardiovascular:  Negative for chest pain.  Gastrointestinal:  Negative for nausea and vomiting.  Neurological:  Negative for dizziness, light-headedness  and headaches.     Physical Exam Triage Vital Signs ED Triage Vitals  Encounter Vitals Group     BP 05/18/24 1137 (!) 131/101     Girls Systolic BP Percentile --      Girls Diastolic BP Percentile --      Boys Systolic BP Percentile --      Boys Diastolic BP Percentile --      Pulse Rate 05/18/24 1137 (!) 102     Resp 05/18/24 1137 16     Temp 05/18/24 1137 97.8 F (36.6 C)     Temp Source 05/18/24 1137 Oral     SpO2 05/18/24 1137 96 %     Weight --      Height --      Head Circumference --      Peak Flow --      Pain Score 05/18/24 1140 10     Pain Loc --      Pain Education --      Exclude from Growth Chart --    No data found.  Updated Vital Signs BP (!) 135/95   Pulse 89   Temp 97.8 F (36.6 C) (Oral)   Resp 16   SpO2 95%   Visual Acuity Right Eye Distance:   Left Eye Distance:   Bilateral Distance:    Right Eye Near:   Left Eye Near:    Bilateral Near:     Physical Exam Vitals reviewed.  Constitutional:      General: He is awake.     Appearance: Normal appearance. He is well-developed. He is not ill-appearing.     Comments: Very pleasant male appears stated age in no acute distress sitting comfortably in exam room  HENT:     Head: Normocephalic and atraumatic.     Right Ear: External ear normal.     Left Ear: External ear normal.     Mouth/Throat:     Pharynx: Uvula midline. Posterior oropharyngeal erythema and uvula swelling present. No oropharyngeal exudate.     Tonsils: Tonsillar exudate present. 2+ on the right. 3+ on the left.     Comments: Significant erythema with exudate of bilateral tonsils with left tonsil slightly larger than right.  No uveal deviation but there is some uvula swelling.  Uvula is midline. Cardiovascular:     Rate and Rhythm: Normal rate and regular rhythm.     Heart sounds: Normal heart sounds, S1 normal and S2 normal. No murmur heard. Pulmonary:     Effort: Pulmonary effort is normal. No accessory muscle usage or  respiratory distress.     Breath sounds: Normal breath sounds. No stridor. No wheezing, rhonchi or rales.     Comments: Clear to auscultation bilaterally Lymphadenopathy:     Head:     Right side of  head: No submental, submandibular or tonsillar adenopathy.     Left side of head: No submental, submandibular or tonsillar adenopathy.  Neurological:     Mental Status: He is alert.  Psychiatric:        Behavior: Behavior is cooperative.      UC Treatments / Results  Labs (all labs ordered are listed, but only abnormal results are displayed) Labs Reviewed  POCT RAPID STREP A (OFFICE) - Abnormal; Notable for the following components:      Result Value   Rapid Strep A Screen Positive (*)    All other components within normal limits    EKG   Radiology No results found.  Procedures Procedures (including critical care time)  Medications Ordered in UC Medications  cefTRIAXone  (ROCEPHIN ) injection 1 g (has no administration in time range)  methylPREDNISolone  sodium succinate (SOLU-MEDROL ) 125 mg/2 mL injection 80 mg (80 mg Intramuscular Given 05/18/24 1155)    Initial Impression / Assessment and Plan / UC Course  I have reviewed the triage vital signs and the nursing notes.  Pertinent labs & imaging results that were available during my care of the patient were reviewed by me and considered in my medical decision making (see chart for details).     Patient was initially mildly tachycardic but this improved after he sat quietly for several minutes.  His oxygen saturation was appropriate and he was otherwise well-appearing, nontoxic.  Given severity of swelling of tonsils as well as uvular swelling he was given Solu-Medrol  in clinic with significant improvement of symptoms.  Reports his pain had improved and he was able to eat some chips and a rice crispy treat and drink an entire cup of water without difficulty.  He was given Rocephin  in clinic and will start Augmentin  given severity  of swelling.  He was encouraged to gargle with warm salt water and use Tylenol  for pain.  We discussed that if at any point anything worsens and he has high fever, dysphagia, swelling of his throat, difficulty managing secretions or swallowing, nausea, vomiting he needs to go to the emergency room immediately.  Strict return precautions given.  We discussed that if his symptoms not improving within 24 hours he should follow-up with ENT and was given the contact information for local provider.  We also discussed that he should dispose of his toothbrush if he is after starting medication to prevent reinfection.  All questions were answered to satisfaction.  Work excuse note provided..   Final Clinical Impressions(s) / UC Diagnoses   Final diagnoses:  Strep pharyngitis     Discharge Instructions      I am glad that you are feeling better after the medication.  Please start Augmentin  twice daily for 10 days.  Gargle with warm salt water and use Tylenol  or Profen for the pain.  Follow-up with the ENT.  Call them to schedule an appointment.  If anything worsens and you have worsening swelling, swelling of your throat, shortness of breath, trouble swallowing, high fever, weakness you need to go to the emergency room immediately.     ED Prescriptions     Medication Sig Dispense Auth. Provider   amoxicillin -clavulanate (AUGMENTIN ) 400-57 MG/5ML suspension Take 10.9 mLs (875 mg total) by mouth 2 (two) times daily for 10 days. 218 mL Jovan Schickling K, PA-C      PDMP not reviewed this encounter.   Sherrell Rocky POUR, PA-C 05/18/24 1315

## 2024-05-18 NOTE — Discharge Instructions (Signed)
 I am glad that you are feeling better after the medication.  Please start Augmentin  twice daily for 10 days.  Gargle with warm salt water and use Tylenol  or Profen for the pain.  Follow-up with the ENT.  Call them to schedule an appointment.  If anything worsens and you have worsening swelling, swelling of your throat, shortness of breath, trouble swallowing, high fever, weakness you need to go to the emergency room immediately.

## 2024-08-21 ENCOUNTER — Ambulatory Visit (HOSPITAL_COMMUNITY)
Admission: EM | Admit: 2024-08-21 | Discharge: 2024-08-21 | Disposition: A | Discharge status: Home / Self Care | Attending: Physician Assistant | Admitting: Physician Assistant

## 2024-08-21 ENCOUNTER — Encounter (HOSPITAL_COMMUNITY): Payer: Self-pay

## 2024-08-21 DIAGNOSIS — Z760 Encounter for issue of repeat prescription: Secondary | ICD-10-CM | POA: Diagnosis not present

## 2024-08-21 DIAGNOSIS — I5022 Chronic systolic (congestive) heart failure: Secondary | ICD-10-CM

## 2024-08-21 MED ORDER — EMPAGLIFLOZIN 10 MG PO TABS: 10.0000 mg | ORAL_TABLET | Freq: Every day | ORAL | 1 refills | Status: AC

## 2024-08-21 MED ORDER — ATORVASTATIN CALCIUM 40 MG PO TABS: 40.0000 mg | ORAL_TABLET | Freq: Every day | ORAL | 0 refills | Status: AC

## 2024-08-21 MED ORDER — CARVEDILOL 3.125 MG PO TABS: 3.1250 mg | ORAL_TABLET | Freq: Two times a day (BID) | ORAL | 1 refills | Status: AC

## 2024-08-21 MED ORDER — SPIRONOLACTONE 25 MG PO TABS: 25.0000 mg | ORAL_TABLET | Freq: Every day | ORAL | 1 refills | Status: AC

## 2024-08-21 NOTE — ED Triage Notes (Signed)
 Pt states started his new job 2 wks ago and is standing on concrete floor. C/o bilateral feet pain and lower back pain. States unable to work today. States gets his first check on Friday and can get better shoed. Pt requesting rx for foot soles and all his medications. Denies taken any meds for pain.

## 2024-08-21 NOTE — Discharge Instructions (Addendum)
 Please call your primary care physician to make an appointment and discuss order for shoe inserts. Until then you can buy shoe inserts from the pharmacy.  Make an appointment with your cardiologist.  I have provided a work note for tomorrow if needed.

## 2024-08-29 NOTE — ED Provider Notes (Signed)
 [facility]    CSN: [id number] Arrival date & time: 08/21/24  1916      History   Chief Complaint Chief Complaint  Patient presents with   Foot Pain   Medication Refill    HPI Willie Campbell is a 53 y.o. male.   Patient presents complaining pain.  He reports he started feeling sore prior to stand.  Sometimes,.  He reports he was required to 5 blocks she has been feeling like she was taken at that time very little support.  Is requesting an order for she started.  He is also requesting his medications to be filled.  He was last seen by his primary care in occasions refilled at this visit, but has not been seen by cardio.  He has been out of medications for about a month.  He is taking nothing for that.  He is requesting a work note.    Past Medical History:  Diagnosis Date   CAD (coronary artery disease)    Cardiac arrest (HCC) 02/26/2022   CHF (congestive heart failure) (HCC)    STEMI (ST elevation myocardial infarction) (HCC) 02/26/2022    Patient Active Problem List   Diagnosis Date Noted   STEMI (ST elevation myocardial infarction) (HCC) 02/26/2022   Acute systolic heart failure (HCC)     Past Surgical History:  Procedure Laterality Date   CORONARY STENT INTERVENTION N/A 02/26/2022   Procedure: CORONARY STENT INTERVENTION;  Surgeon: Claudene Victory ORN, MD;  Location: [facility];  Service: Cardiovascular;  Laterality: N/A;   HERNIA REPAIR     RIGHT/LEFT HEART CATH AND CORONARY ANGIOGRAPHY N/A 02/26/2022   Procedure: RIGHT/LEFT HEART CATH AND CORONARY ANGIOGRAPHY;  Surgeon: Claudene Victory ORN, MD;  Location: [facility];  Service: Cardiovascular;  Laterality: N/A;       Home Medications    Prior to Admission medications   Medication Sig Start Date End Date Taking? Authorizing Provider  aspirin  81 MG chewable tablet Chew 1 tablet (81 mg total) by mouth and swallow daily. 06/21/23     atorvastatin  (LIPITOR) 40 MG tablet Take 1 tablet (40 mg total) by  mouth daily. 08/21/24   Ward, Harlene PEDLAR, PA-C  carvedilol  (COREG ) 3.125 MG tablet Take 1 tablet (3.125 mg total) by mouth in the morning and 1 tablet (3.125 mg total) in the evening. Take with meals. 08/21/24   Ward, Harlene PEDLAR, PA-C  empagliflozin  (JARDIANCE ) 10 MG TABS tablet Take 1 tablet (10 mg total) by mouth daily. 08/21/24   Ward, Harlene PEDLAR, PA-C  ENTRESTO  24-26 MG Take 1 tablet by mouth 2 (two) times daily. 09/27/23   Theotis Haze ORN, NP  nitroGLYCERIN  (NITROSTAT ) 0.4 MG SL tablet Place 1 tablet (0.4 mg total) under the tongue every 5 (five) minutes as needed for chest pain. 06/21/23     spironolactone  (ALDACTONE ) 25 MG tablet Take 1 tablet (25 mg total) by mouth daily. 08/21/24   Ward, Harlene PEDLAR, PA-C  ticagrelor  (BRILINTA ) 90 MG TABS tablet Take 1 tablet (90 mg total) by mouth 2 (two) times daily. 03/22/23   Bensimhon, Toribio SAUNDERS, MD    Family History History reviewed. No pertinent family history.  Social History Social History   Tobacco Use   Smoking status: Some Days    Current packs/day: 0.50    Average packs/day: 0.5 packs/day for 15.0 years (7.5 ttl pk-yrs)    Types: Cigarettes   Smokeless tobacco: Never  Vaping Use   Vaping status: Never Used  Substance Use Topics   Alcohol use: Yes    Comment: occasionally   Drug use: No     Allergies   Bee venom   Review of Systems Review of Systems  Constitutional:  Negative for chills and fever.  HENT:  Negative for ear pain and sore throat.   Eyes:  Negative for pain and visual disturbance.  Respiratory:  Negative for cough and shortness of breath.   Cardiovascular:  Negative for chest pain and palpitations.  Gastrointestinal:  Negative for abdominal pain and vomiting.  Genitourinary:  Negative for dysuria and hematuria.  Musculoskeletal:  Positive for arthralgias and back pain.  Skin:  Negative for color change and rash.  Neurological:  Negative for seizures and syncope.  All other systems reviewed and are  negative.    Physical Exam Triage Vital Signs ED Triage Vitals  Encounter Vitals Group     BP 08/21/24 2001 116/74     Girls Systolic BP Percentile --      Girls Diastolic BP Percentile --      Boys Systolic BP Percentile --      Boys Diastolic BP Percentile --      Pulse Rate 08/21/24 2001 93     Resp 08/21/24 2001 18     Temp 08/21/24 2001 98.5 F (36.9 C)     Temp Source 08/21/24 2001 Oral     SpO2 08/21/24 2001 96 %     Weight --      Height --      Head Circumference --      Peak Flow --      Pain Score 08/21/24 1959 7     Pain Loc --      Pain Education --      Exclude from Growth Chart --    No data found.  Updated Vital Signs BP 116/74 (BP Location: Left Arm)   Pulse 93   Temp 98.5 F (36.9 C) (Oral)   Resp 18   SpO2 96%   Visual Acuity Right Eye Distance:   Left Eye Distance:   Bilateral Distance:    Right Eye Near:   Left Eye Near:    Bilateral Near:     Physical Exam Vitals and nursing note reviewed.  Constitutional:      General: He is not in acute distress.    Appearance: He is well-developed.  HENT:     Head: Normocephalic and atraumatic.  Eyes:     Conjunctiva/sclera: Conjunctivae normal.  Cardiovascular:     Rate and Rhythm: Normal rate and regular rhythm.     Heart sounds: No murmur heard. Pulmonary:     Effort: Pulmonary effort is normal. No respiratory distress.     Breath sounds: Normal breath sounds.  Abdominal:     Palpations: Abdomen is soft.     Tenderness: There is no abdominal tenderness.  Musculoskeletal:        General: No swelling.     Cervical back: Neck supple.  Skin:    General: Skin is warm and dry.     Capillary Refill: Capillary refill takes less than 2 seconds.  Neurological:     Mental Status: He is alert.  Psychiatric:        Mood and Affect: Mood normal.      UC Treatments / Results  Labs (all labs ordered are listed, but only abnormal results are displayed) Labs Reviewed - No data to  display  EKG   Radiology No results found.  Procedures Procedures (including critical care time)  Medications Ordered in UC Medications - No data to display  Initial Impression / Assessment and Plan / UC Course  I have reviewed the triage vital signs and the nursing notes.  Pertinent labs & imaging results that were available during my care of the patient were reviewed by me and considered in my medical decision making (see chart for details).     Pain with flexion today.  Pain patient associates with poor supportive shoes.  Advised to follow-up with PCP for DME orders like she was inserts.  Work note given today.  Medications refilled.  Advised to call PCP and cardiologist to make follow-up appointments. Final Clinical Impressions(s) / UC Diagnoses   Final diagnoses:  Medication refill     Discharge Instructions      Please call your primary care physician to make an appointment and discuss order for shoe inserts. Until then you can buy shoe inserts from the pharmacy.  Make an appointment with your cardiologist.  I have provided a work note for tomorrow if needed.    ED Prescriptions     Medication Sig Dispense Auth. Provider   atorvastatin  (LIPITOR) 40 MG tablet Take 1 tablet (40 mg total) by mouth daily. 60 tablet Ward, Jannis Atkins Z, PA-C   carvedilol  (COREG ) 3.125 MG tablet Take 1 tablet (3.125 mg total) by mouth in the morning and 1 tablet (3.125 mg total) in the evening. Take with meals. 120 tablet Ward, Sarahann Horrell Z, PA-C   spironolactone  (ALDACTONE ) 25 MG tablet Take 1 tablet (25 mg total) by mouth daily. 90 tablet Ward, Ugochi Henzler Z, PA-C   empagliflozin  (JARDIANCE ) 10 MG TABS tablet Take 1 tablet (10 mg total) by mouth daily. 90 tablet Ward, Alishea Beaudin Z, PA-C      PDMP not reviewed this encounter.   Ward, Harlene PEDLAR, PA-C 08/29/24 1521

## 2024-10-05 ENCOUNTER — Ambulatory Visit (HOSPITAL_COMMUNITY)
Admission: EM | Admit: 2024-10-05 | Discharge: 2024-10-05 | Disposition: A | Discharge status: Home / Self Care | Attending: Emergency Medicine | Admitting: Emergency Medicine

## 2024-10-05 ENCOUNTER — Encounter (HOSPITAL_COMMUNITY): Payer: Self-pay

## 2024-10-05 DIAGNOSIS — S81812A Laceration without foreign body, left lower leg, initial encounter: Secondary | ICD-10-CM | POA: Diagnosis not present

## 2024-10-05 DIAGNOSIS — L03116 Cellulitis of left lower limb: Secondary | ICD-10-CM | POA: Diagnosis not present

## 2024-10-05 MED ORDER — DOXYCYCLINE HYCLATE 100 MG PO CAPS: 100.0000 mg | ORAL_CAPSULE | Freq: Two times a day (BID) | ORAL | 0 refills | Status: AC

## 2024-10-05 MED ORDER — MUPIROCIN 2 % EX OINT: 1.0000 | TOPICAL_OINTMENT | Freq: Two times a day (BID) | CUTANEOUS | 0 refills | Status: AC

## 2024-10-05 NOTE — Discharge Instructions (Signed)
 Start taking doxycycline  twice daily for 10 days for wound infection coverage. Apply mupirocin  ointment twice daily for additional infection coverage. Be sure to keep this area clean dry and covered. If you develop worsening swelling, spreading of redness, or significant amount of puslike drainage from the area return here for reevaluation.
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
